# Patient Record
Sex: Female | Born: 1975 | Race: White | Hispanic: Yes | Marital: Married | Smoking: Never smoker
Health system: Southern US, Community
[De-identification: ages and names within clinical notes are randomized; demographics above are authoritative.]

## PROBLEM LIST (undated history)

## (undated) ENCOUNTER — Inpatient Hospital Stay (HOSPITAL_COMMUNITY): Payer: Self-pay

## (undated) DIAGNOSIS — M4310 Spondylolisthesis, site unspecified: Secondary | ICD-10-CM

## (undated) DIAGNOSIS — Z789 Other specified health status: Secondary | ICD-10-CM

## (undated) HISTORY — PX: COLONOSCOPY: SHX174

## (undated) HISTORY — PX: REDUCTION MAMMAPLASTY: SUR839

## (undated) HISTORY — DX: Spondylolisthesis, site unspecified: M43.10

## (undated) HISTORY — PX: EYE SURGERY: SHX253

---

## 2004-04-21 ENCOUNTER — Other Ambulatory Visit: Admission: RE | Admit: 2004-04-21 | Discharge: 2004-04-21 | Payer: Self-pay | Admitting: Obstetrics and Gynecology

## 2005-05-04 ENCOUNTER — Other Ambulatory Visit: Admission: RE | Admit: 2005-05-04 | Discharge: 2005-05-04 | Payer: Self-pay | Admitting: Obstetrics and Gynecology

## 2006-06-22 ENCOUNTER — Other Ambulatory Visit: Admission: RE | Admit: 2006-06-22 | Discharge: 2006-06-22 | Payer: Self-pay | Admitting: Obstetrics and Gynecology

## 2006-12-29 ENCOUNTER — Inpatient Hospital Stay (HOSPITAL_COMMUNITY): Admission: AD | Admit: 2006-12-29 | Discharge: 2006-12-31 | Payer: Self-pay | Admitting: Obstetrics and Gynecology

## 2009-10-10 ENCOUNTER — Encounter: Admission: RE | Admit: 2009-10-10 | Discharge: 2009-10-10 | Payer: Self-pay | Admitting: Sports Medicine

## 2009-10-10 ENCOUNTER — Ambulatory Visit: Payer: Self-pay | Admitting: Family Medicine

## 2009-10-17 ENCOUNTER — Ambulatory Visit (HOSPITAL_COMMUNITY): Payer: Self-pay | Admitting: Psychology

## 2009-11-21 ENCOUNTER — Ambulatory Visit (HOSPITAL_COMMUNITY): Payer: Self-pay | Admitting: Psychology

## 2010-10-08 NOTE — Assessment & Plan Note (Signed)
Summary: SINUS & SORE THROAT/KH   Vital Signs:  Patient Profile:   35 Years Old Female CC:      sore throat Height:     62 inches Weight:      172 pounds O2 Sat:      100 % O2 treatment:    Room Air Temp:     97.6 degrees F oral Pulse rate:   75 / minute Pulse rhythm:   regular Resp:     18 per minute BP sitting:   117 / 81  (right arm) Cuff size:   regular  Pt. in pain?   yes    Location:   body aches  Vitals Entered By: Lita Mains, RN                   Updated Prior Medication List: No Medications Current Allergies: ! VAGISIL (BENZOCAINE-RESORCINOL)History of Present Illness History from: patient Chief Complaint: sore throat History of Present Illness: for the past two day the patient has comaplaints of fever, body aches, HAs sore throat, runny nose,  and sharp ear pain bilaterally. She is currently a-febrile. She has taken excedrin for HAs. HER CHILD WAS DXED WTH STREP LAST WK. NO COUGH . NO VOMITING.   REVIEW OF SYSTEMS Constitutional Symptoms      Denies fever, chills, night sweats, weight loss, weight gain, and fatigue.  Eyes       Complains of eye pain.      Denies change in vision, eye discharge, glasses, contact lenses, and eye surgery. Ear/Nose/Throat/Mouth       Complains of ear pain, frequent runny nose, sinus problems, and sore throat.      Denies hearing loss/aids, change in hearing, ear discharge, dizziness, frequent nose bleeds, hoarseness, and tooth pain or bleeding.  Respiratory       Denies dry cough, productive cough, wheezing, shortness of breath, asthma, bronchitis, and emphysema/COPD.  Cardiovascular       Denies murmurs, chest pain, and tires easily with exhertion.    Gastrointestinal       Denies stomach pain, nausea/vomiting, diarrhea, constipation, blood in bowel movements, and indigestion. Genitourniary       Denies painful urination, kidney stones, and loss of urinary control. Neurological       Complains of headaches.      Denies  paralysis, seizures, and fainting/blackouts. Musculoskeletal       Denies muscle pain, joint pain, joint stiffness, decreased range of motion, redness, swelling, muscle weakness, and gout.      Comments: body aches Skin       Denies bruising, unusual mles/lumps or sores, and hair/skin or nail changes.  Psych       Denies mood changes, temper/anger issues, anxiety/stress, speech problems, depression, and sleep problems.  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 PETICIA ON THE SOFT PALAT Chest/Lungs: no rales, wheezes, or rhonchi bilateral, breath sounds equal without effort Heart: regular rate and  rhythm, no murmur Skin: no obvious rashes or lesions Assessment New Problems: PHARYNGITIS, ACUTE (ICD-462.0)   Plan New Medications/Changes: AMOXICILLIN 500 MG  CAPS (AMOXICILLIN) 1 by mouth TID  ##30 x 0, 10/10/2009, Marvis Moeller DO  New Orders: New Patient Level III [99203]   Prescriptions: AMOXICILLIN 500 MG CAPS (AMOXICILLIN) 1 by mouth TID  ##30 x 0   Entered and Authorized by:   Marvis Moeller DO   Signed by:   Marvis Moeller DO on 10/10/2009   Method used:   Print then Give to Patient   RxID:   [id number]   Patient Instructions: 1)  TYLENOL OR MOTRIN AS NEEDED. AVOID CAFFEINE AND MILK PRODUCTS. MUCINEX D RECOMMENDED. THROAT SPRAY OF CHOICE. FOLLOW UP WITH YOUR PCP OR RETURN IF SYMPTOMS PERSIST.

## 2010-10-08 NOTE — Letter (Signed)
Summary: Out of Work  [facility]  [address]   [place], [place] [address]   Phone: [phone]  Fax: [phone]    October 10, 2009   Employee:  Courtney Costa    To Whom It May Concern:   For Medical reasons, please excuse the above named employee from work for the following dates:  Start:   10 Oct 2009  End:   13 Oct 2009  If you need additional information, please feel free to contact our office.         Sincerely,    Marvis Moeller DO

## 2010-11-17 ENCOUNTER — Encounter: Payer: Self-pay | Admitting: Family Medicine

## 2010-11-17 ENCOUNTER — Ambulatory Visit (INDEPENDENT_AMBULATORY_CARE_PROVIDER_SITE_OTHER): Payer: BC Managed Care – PPO | Admitting: Family Medicine

## 2010-11-17 DIAGNOSIS — J029 Acute pharyngitis, unspecified: Secondary | ICD-10-CM

## 2010-11-25 NOTE — Letter (Signed)
Summary: Handout Printed  Printed Handout:  - Rheumatic Fever 

## 2010-11-25 NOTE — Assessment & Plan Note (Signed)
Summary: Additional lab testing  Rapid strep test negative Donna Christen MD  November 17, 2010 7:19 PM

## 2010-11-25 NOTE — Assessment & Plan Note (Signed)
Summary: POSS STREP THROAT /WSE Room 4   Vital Signs:  Patient Profile:   35 Years Old Female CC:      Sore throat x 2 days, son was diagnosed with strep last week Height:     62 inches Weight:      174 pounds O2 Sat:      100 % O2 treatment:    Room Air Temp:     98.8 degrees F oral Pulse rate:   85 / minute Pulse rhythm:   regular Resp:     16 per minute BP sitting:   113 / 80  (left arm) Cuff size:   regular  Vitals Entered By: Emilio Math (November 17, 2010 6:35 PM)                  Current Allergies (reviewed today): ! VAGISIL (BENZOCAINE-RESORCINOL)History of Present Illness Chief Complaint: Sore throat x 2 days, son was diagnosed with strep last week History of Present Illness:  Subjective: Patient complains of sore throat that started yesterday morning.  Her son was treated for a documented strep pharngitis last week (rapid strep negative but throat culture positive) No cough No pleuritic pain No wheezing + nasal congestion but has seasonal rhinitis No post-nasal drainage No sinus pain/pressure No itchy/red eyes No earache No hemoptysis No SOB + fever/chills No nausea No vomiting No abdominal pain No diarrhea No skin rashes + fatigue + myalgias + headache    REVIEW OF SYSTEMS Constitutional Symptoms      Denies fever, chills, night sweats, weight loss, weight gain, and fatigue.  Eyes       Denies change in vision, eye pain, eye discharge, glasses, contact lenses, and eye surgery. Ear/Nose/Throat/Mouth       Complains of frequent runny nose and sore throat.      Denies hearing loss/aids, change in hearing, ear pain, ear discharge, dizziness, frequent nose bleeds, sinus problems, hoarseness, and tooth pain or bleeding.  Respiratory       Denies dry cough, productive cough, wheezing, shortness of breath, asthma, bronchitis, and emphysema/COPD.  Cardiovascular       Denies murmurs, chest pain, and tires easily with exhertion.    Gastrointestinal     Denies stomach pain, nausea/vomiting, diarrhea, constipation, blood in bowel movements, and indigestion. Genitourniary       Denies painful urination, kidney stones, and loss of urinary control. Neurological       Complains of headaches.      Denies paralysis, seizures, and fainting/blackouts. Musculoskeletal       Complains of muscle pain and joint pain.      Denies joint stiffness, decreased range of motion, redness, swelling, muscle weakness, and gout.  Skin       Denies bruising, unusual mles/lumps or sores, and hair/skin or nail changes.  Psych       Denies mood changes, temper/anger issues, anxiety/stress, speech problems, depression, and sleep problems.  Past History:  Past Medical History: Reviewed history from 10/10/2009 and no changes required. Unremarkable  Family History: Reviewed history from 10/10/2009 and no changes required. Father-Psoriasis  Social History: Reviewed history from 10/10/2009 and no changes required. 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  Extraocular movement is intact.  Conjunctivae are not inflamed.  Ears:  Canals normal.  Tympanic membranes normal.   Nose:  Congested turbinates; no sinus tenderness Pharynx:  Erythematous Neck:  Supple.   Tender anterior/posterior nodes are palpated bilaterally.  Lungs:  Clear to auscultation.  Breath sounds are equal.  Heart:  Regular rate and rhythm without murmurs, rubs, or gallops.  Skin:  No rash  Assessment New Problems: ACUTE PHARYNGITIS (ICD-462)  SUSPECT GROUP A STREP PHARYNGITIS  Plan New Medications/Changes: PENICILLIN V POTASSIUM 500 MG TABS (PENICILLIN V POTASSIUM) 1 by mouth three times a day for 10 days  #30 x 0, 11/17/2010, Donna Christen MD  New Orders: Est. Patient Level III [phone] Planning Comments:   Will treat empirically  since family member has strep.  Begin penicillin.  Ibuprofen 200mg , 4 tabs every 8 hours with food  Follow-up with PCP if not improving.   The patient and/or caregiver has been counseled thoroughly with regard to medications prescribed including dosage, schedule, interactions, rationale for use, and possible side effects and they verbalize understanding.  Diagnoses and expected course of recovery discussed and will return if not improved as expected or if the condition worsens. Patient and/or caregiver verbalized understanding.  Prescriptions: PENICILLIN V POTASSIUM 500 MG TABS (PENICILLIN V POTASSIUM) 1 by mouth three times a day for 10 days  #30 x 0   Entered and Authorized by:   Donna Christen MD   Signed by:   Donna Christen MD on 11/17/2010   Method used:   Print then Give to Patient   RxID:   [id number]   Orders Added: 1)  Est. Patient Level III [40102]  Appended Document: POSS STREP THROAT [place] Room 4 Rapid strep: Neg

## 2011-01-23 NOTE — Discharge Summary (Signed)
NAMECHIANN, GOFFREDO NO.:  [id number]   MEDICAL RECORD NO.:  [id number]          PATIENT TYPE:  INP   LOCATION:  9110                          FACILITY:  [facility]   PHYSICIAN:  Sherron Monday, MD        DATE OF BIRTH:  1976/02/12   DATE OF ADMISSION:  12/29/2006  DATE OF DISCHARGE:  12/31/2006                               DISCHARGE SUMMARY   ADMISSION DIAGNOSIS:  Intrauterine pregnancy at term, in early labor.   DISCHARGE DIAGNOSIS:  Intrauterine pregnancy at term, in early labor,  delivered via spontaneous vaginal delivery.   HISTORY OF PRESENT ILLNESS:  A 35 year old G2, P1-0-0-1, at 4 and 1,  with regular contractions for several hours every approximately 5  minutes.  She states the contractions started overnight and were every  10 minutes but now are every 5.  They are more frequent and more  intense.  She states she has had good fetal movement, no loss of fluid,  no vaginal bleeding.  Her prenatal care was uncomplicated.   PAST MEDICAL HISTORY:  Not significant.   PAST SURGICAL HISTORY:  Not significant.   PAST OB/GYN HISTORY:  G1 term vaginal delivery, an 8 pound 4 ounce female  infant.  G2 is the present pregnancy, no complications.  She has no  history of any abnormal Pap smears.  A remote history of Chlamydia.   Currently she takes no medications.   ALLERGIES:  No known drug allergies.   SOCIAL HISTORY:  She denies alcohol, tobacco or drug use.  She is  married.   FAMILY HISTORY:  Significant for hypertension in her father and mother  with varicosities.   PRENATAL LABORATORY DATA:  Hemoglobin 12.9, platelets 250,000.  B  positive, antibody screen negative.  Gonorrhea negative, Chlamydia  negative.  RPR nonreactive.  Rubella immune.  Hepatitis B surface  antigen negative.  HIV negative.  Cystic fibrosis screen negative.  Glucola 90.  Group B strep negative.  Ultrasound performed at 19 and 2  weeks revealed appropriate growth, marginal previa, and a  female infant.  Repeat scan at 24 and 2, marginal placenta previa.  A 32 and 5 week scan  on February 29 revealed that the previa was resolved, normal growth and  normal heart anatomy.   PHYSICAL EXAMINATION ON ADMISSION:  GENERAL:  Afebrile, vital signs  stable, and a benign exam.  PELVIC:  Vaginal exam:  3-4 cm dilated, 70% effaced, and -2 station.  MONITORING:  Fetal heart tones in the 130s-140s and reactive, and she is  contracting every 1-3 minutes.   She was admitted anticipating a spontaneous vaginal delivery.  Her group  B strep was negative.  She did not need prophylaxis.  The plan was to  augment her labor with Pitocin if necessary.  At 6:10 that evening she  was ruptured for clear fluid and an IUPC was placed.  As the evening  went on she progressed to complete, complete, +3, and pushed well to  deliver a viable female infant at 2153 from OA to LOT.  Apgars were 9 at  one minute  and 9 at five minutes.  A weight of 8 pounds 4 ounces.  The  placenta was delivered intact at 2204.  OB lacerations revealed a right  labial, a first degree perineal, both repaired with 3-0 Vicryl.  EBL was  less than 500 mL.  She is B positive, rubella immune.  Her postpartum  course was relatively uncomplicated.  She remained afebrile and vital  signs stable throughout her postpartum course.  Her hemoglobin decreased  from 11.3 to 8.8.  she was discharged home on postpartum day #2 with  prescriptions for Motrin, Vicodin, as well as prenatal vitamins and  iron.  She will follow up in the office in approximately 6 weeks.  She  was given appropriate numbers to call with any questions or problems.   DISCHARGE INFORMATION:  She is B positive, rubella immune.  Hemoglobin  from 11.3 to 8.8.  Contraception:  She plans for natural family planning  and was counseled to continue a prenatal vitamin while she is doing  this.  She will breast-feed and has worked with a Advertising copywriter.      Sherron Monday,  MD  Electronically Signed     JB/MEDQ  D:  12/31/2006  T:  12/31/2006  Job:  [id number]

## 2011-07-09 ENCOUNTER — Inpatient Hospital Stay (INDEPENDENT_AMBULATORY_CARE_PROVIDER_SITE_OTHER)
Admission: RE | Admit: 2011-07-09 | Discharge: 2011-07-09 | Disposition: A | Discharge status: Home / Self Care | Payer: BC Managed Care – PPO | Source: Ambulatory Visit | Attending: Family Medicine | Admitting: Family Medicine

## 2011-07-09 ENCOUNTER — Encounter: Payer: Self-pay | Admitting: Family Medicine

## 2011-07-09 DIAGNOSIS — J069 Acute upper respiratory infection, unspecified: Secondary | ICD-10-CM

## 2011-07-15 ENCOUNTER — Telehealth (INDEPENDENT_AMBULATORY_CARE_PROVIDER_SITE_OTHER): Payer: Self-pay | Admitting: *Deleted

## 2011-08-10 NOTE — Telephone Encounter (Signed)
  Phone Note Outgoing Call Call back at [facility] Phone [phone]   Call placed by: Lajean Saver RN,  July 15, 2011 4:14 PM Call placed to: Patient Summary of Call: Callback: Patient reports she is feeling much better. She still c/o some sinus congestion.

## 2011-08-10 NOTE — Progress Notes (Signed)
Summary: COLD,COUGH....WSE rm 4   Vital Signs:  Patient Profile:   35 Years Old Female CC:      cough, hoarseness, sinus pain and green nasal congestion x 1 wk Height:     62 inches Weight:      183.50 pounds O2 Sat:      99 % O2 treatment:    Room Air Temp:     98.4 degrees F oral Pulse rate:   82 / minute Resp:     18 per minute BP sitting:   115 / 67  (left arm) Cuff size:   regular  Vitals Entered By: Clemens Catholic LPN (July 09, 2011 11:56 AM)                  Updated Prior Medication List: No Medications Current Allergies (reviewed today): ! VAGISIL (BENZOCAINE-RESORCINOL)History of Present Illness Chief Complaint: cough, hoarseness, sinus pain and green nasal congestion x 1 wk History of Present Illness:  Subjective: Patient complains of onset of sore throat about a week ago, now improved.  She has now developed sinus congestion and a cough for four days.  No pleuritic pain, but has soreness in the anterior chest No wheezing ? post-nasal drainage No sinus pain/pressure No itchy/red eyes No earache No hemoptysis No SOB No fever, + chills yesterday No nausea No vomiting No abdominal pain No diarrhea No skin rashes + fatigue + myalgias No headache   REVIEW OF SYSTEMS Constitutional Symptoms      Denies fever, chills, night sweats, weight loss, weight gain, and fatigue.  Eyes       Complains of eye surgery.      Denies change in vision, eye pain, eye discharge, glasses, and contact lenses. Ear/Nose/Throat/Mouth       Complains of sinus problems, sore throat, and hoarseness.      Denies hearing loss/aids, change in hearing, ear pain, ear discharge, dizziness, frequent runny nose, frequent nose bleeds, and tooth pain or bleeding.  Respiratory       Complains of dry cough.      Denies productive cough, wheezing, shortness of breath, asthma, bronchitis, and emphysema/COPD.  Cardiovascular       Denies murmurs, chest pain, and tires easily with  exhertion.    Gastrointestinal       Complains of diarrhea.      Denies stomach pain, nausea/vomiting, constipation, blood in bowel movements, and indigestion. Genitourniary       Denies painful urination, kidney stones, and loss of urinary control. Neurological       Denies paralysis, seizures, and fainting/blackouts. Musculoskeletal       Denies muscle pain, joint pain, joint stiffness, decreased range of motion, redness, swelling, muscle weakness, and gout.  Skin       Denies bruising, unusual mles/lumps or sores, and hair/skin or nail changes.  Psych       Complains of anxiety/stress.      Denies mood changes, temper/anger issues, speech problems, depression, and sleep problems. Other Comments: pt c/o cough, hoarseness, sinus pain and green nasal congestion x 1 wk. she has taken Sudafed.   Past History:  Past Medical History: Reviewed history from 10/10/2009 and no changes required. Unremarkable  Past Surgical History: Reviewed history from 10/10/2009 and no changes required. Denies surgical history  Family History: Reviewed history from 10/10/2009 and no changes required. Father-Psoriasis  Social History: Reviewed history from 10/10/2009 and no changes required. 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  Extraocular movement is intact.  Conjunctivae are not inflamed.  Ears:  Canals normal.  Tympanic membranes normal.   Nose:  Mildly congested turbinates.  No sinus tenderness  Pharynx:  Normal  Neck:  Supple.  Slightly tender shotty posterior nodes are palpated bilaterally.  Chest:  Nontender Lungs:  Clear to auscultation.  Breath sounds are equal.  Heart:  Regular rate and rhythm without murmurs, rubs, or gallops.  Abdomen:  Nontender without masses or  hepatosplenomegaly.  Bowel sounds are present.  No CVA or flank tenderness.  Extremities:  No edema.   Skin:  No rash Assessment New Problems: UPPER RESPIRATORY INFECTION, ACUTE (ICD-465.9)  NO EVIDENCE BACTERIAL INFECTION TODAY  Plan New Medications/Changes: BENZONATATE 200 MG CAPS (BENZONATATE) One by mouth hs as needed cough  #12 x 0, 07/09/2011, Donna Christen MD AZITHROMYCIN 250 MG TABS (AZITHROMYCIN) Two tabs by mouth on day 1, then 1 tab daily on days 2 through 5 (Rx void after 07/16/11)  #6 tabs x 0, 07/09/2011, Donna Christen MD  New Orders: Pulse Oximetry (single measurment) [94760] Est. Patient Level III [[address]] Planning Comments:   Treat symptomatically for now:  Increase fluid intake, begin expectorant/decongestant, topical decongestant,  cough suppressant at bedtime.  If fever/chills/sweats persist, or if not improving 5  days begin Z-pack (given Rx to hold).  Followup with PCP if not improving 7 to 10 days.   The patient and/or caregiver has been counseled thoroughly with regard to medications prescribed including dosage, schedule, interactions, rationale for use, and possible side effects and they verbalize understanding.  Diagnoses and expected course of recovery discussed and will return if not improved as expected or if the condition worsens. Patient and/or caregiver verbalized understanding.  Prescriptions: BENZONATATE 200 MG CAPS (BENZONATATE) One by mouth hs as needed cough  #12 x 0   Entered and Authorized by:   Donna Christen MD   Signed by:   Donna Christen MD on 07/09/2011   Method used:   Print then Give to Patient   RxID:   [id number] AZITHROMYCIN 250 MG TABS (AZITHROMYCIN) Two tabs by mouth on day 1, then 1 tab daily on days 2 through 5 (Rx void after 07/16/11)  #6 tabs x 0   Entered and Authorized by:   Donna Christen MD   Signed by:   Donna Christen MD on 07/09/2011   Method used:   Print then Give to Patient   RxID:   [id number]   Patient  Instructions: 1)  Take Mucinex D (guaifenesin with decongestant) twice daily for congestion. 2)  Increase fluid intake, rest. 3)  May use Afrin nasal spray (or generic oxymetazoline) twice daily for about 5 days.  Also recommend using saline nasal spray several times daily and/or saline nasal irrigation. 4)  Begin Azithromycin if not improving about 5 days or if persistent fever develops. 5)  Followup with family doctor if not improving 7 to 10 days.   Orders Added: 1)  Pulse Oximetry (single measurment) [94760] 2)  Est. Patient Level III [29528]

## 2012-05-16 ENCOUNTER — Encounter: Payer: Self-pay | Admitting: Family Medicine

## 2012-05-16 ENCOUNTER — Ambulatory Visit (INDEPENDENT_AMBULATORY_CARE_PROVIDER_SITE_OTHER): Payer: BC Managed Care – PPO | Admitting: Family Medicine

## 2012-05-16 VITALS — BP 125/84 | HR 69 | Ht 61.0 in | Wt 173.0 lb

## 2012-05-16 DIAGNOSIS — Q762 Congenital spondylolisthesis: Secondary | ICD-10-CM

## 2012-05-16 DIAGNOSIS — M4310 Spondylolisthesis, site unspecified: Secondary | ICD-10-CM | POA: Insufficient documentation

## 2012-05-16 HISTORY — DX: Spondylolisthesis, site unspecified: M43.10

## 2012-05-16 MED ORDER — BENZOCAINE-RESORCINOL 5-2 % VA CREA: TOPICAL_CREAM | Freq: Every day | VAGINAL | Status: DC

## 2012-05-16 NOTE — Progress Notes (Signed)
CC: Courtney Costa is a 36 y.o. female is here for Establish Care   Subjective: HPI:  Patient presents to establish care and to discuss back pain. She reports no significant past medical history nor surgical history other than the delivery of 2 healthy boys. She takes no medications on a daily basis she has no formal exercise plan. Tries to watch what she eats she does not abuse alcohol, tobacco nor recreational drugs.  Past family history is only significant for type 2 diabetes  She's had low back pain that central and nonradiating for the past years. It seems to get worse with activity and improves with swimming or cardio workouts. She had an x-ray in 2011 that reviewed with her today it shows spondylolisthesis of L5 over S1. She denies any motor or sensory disturbances in the lower extremities, saddle anesthesia, nor bowel or bladder incontinence. The pain is not worsened over the past years but appears to be more annoying lately. She denies any history of trauma. The pain response to Motrin. She denies any dysuria, flank pain, abdominal pain, nor bowel irregularities, nor skin changes over the site of her pain.   She is requesting referral to a breast reduction surgeon.   Review Of Systems Outlined In HPI  History reviewed. No pertinent past medical history.   History reviewed. No pertinent family history.   History  Substance Use Topics  . Smoking status: Never Smoker   . Smokeless tobacco: Not on file  . Alcohol Use: Not on file     Objective: Filed Vitals:   05/16/12 1111  BP: 125/84  Pulse: 69    General: Alert and Oriented, No Acute Distress HEENT: Pupils equal, round, reactive to light. Conjunctivae clear.   Lungs: Clear to auscultation bilaterally, no wheezing/ronchi/rales.  Comfortable work of breathing. Good air movement. Cardiac: Regular rate and rhythm. Normal S1/S2.  No murmurs, rubs, nor gallops.   Abdomen: soft and non tender without palpable  masses. Extremities: No peripheral edema.  Strong peripheral pulses.  MSK: Normal gait, full range of motion and strength in lower extremities, back pain not reproducible localized over the superior aspect of the sacrum.  Skin: Warm and dry.  Assessment & Plan: Courtney Costa was seen today for establish care.  Diagnoses and associated orders for this visit:  Spondylisthesis - Ambulatory referral to Physical Therapy  Patient would like physical therapy referral to start a home exercise plan to help reduce her back pain, expect for stabilization and rehabilitation of core musculature to aid in this. Continues to use nonsteroidal anti-inflammatories as needed. She'll be getting a lab work by her employer this weekend and she'll share this with [place], other than cholesterol and diabetic screening I not sure if she needs much more. She'll contact us with the name and location of a breast reduction surgeon that she's her positive feedback from probably happy to place a referral.    Return in about 1 year (around 05/16/2013).  Requested Prescriptions   Signed Prescriptions Disp Refills  . benzocaine-resorcinol (VAGISIL) 5-2 % vaginal cream 60 g 0    Sig: Place vaginally at bedtime.

## 2012-05-31 ENCOUNTER — Ambulatory Visit: Payer: BC Managed Care – PPO | Admitting: Physical Therapy

## 2013-01-06 ENCOUNTER — Other Ambulatory Visit (HOSPITAL_COMMUNITY)
Admission: RE | Admit: 2013-01-06 | Discharge: 2013-01-06 | Disposition: A | Discharge status: Home / Self Care | Payer: BC Managed Care – PPO | Source: Ambulatory Visit | Attending: Family Medicine | Admitting: Family Medicine

## 2013-01-06 ENCOUNTER — Ambulatory Visit (INDEPENDENT_AMBULATORY_CARE_PROVIDER_SITE_OTHER): Payer: BC Managed Care – PPO | Admitting: Family Medicine

## 2013-01-06 ENCOUNTER — Encounter: Payer: Self-pay | Admitting: Family Medicine

## 2013-01-06 VITALS — BP 127/79 | HR 71 | Ht 61.0 in | Wt 183.0 lb

## 2013-01-06 DIAGNOSIS — Z23 Encounter for immunization: Secondary | ICD-10-CM

## 2013-01-06 DIAGNOSIS — Z01419 Encounter for gynecological examination (general) (routine) without abnormal findings: Secondary | ICD-10-CM | POA: Insufficient documentation

## 2013-01-06 DIAGNOSIS — E669 Obesity, unspecified: Secondary | ICD-10-CM

## 2013-01-06 DIAGNOSIS — Z0000 Encounter for general adult medical examination without abnormal findings: Secondary | ICD-10-CM

## 2013-01-06 DIAGNOSIS — Z13220 Encounter for screening for lipoid disorders: Secondary | ICD-10-CM

## 2013-01-06 DIAGNOSIS — Z124 Encounter for screening for malignant neoplasm of cervix: Secondary | ICD-10-CM

## 2013-01-06 DIAGNOSIS — Z113 Encounter for screening for infections with a predominantly sexual mode of transmission: Secondary | ICD-10-CM | POA: Insufficient documentation

## 2013-01-06 DIAGNOSIS — Z130 Encounter for screening for diseases of the blood and blood-forming organs and certain disorders involving the immune mechanism: Secondary | ICD-10-CM

## 2013-01-06 DIAGNOSIS — Z131 Encounter for screening for diabetes mellitus: Secondary | ICD-10-CM

## 2013-01-06 DIAGNOSIS — Z1151 Encounter for screening for human papillomavirus (HPV): Secondary | ICD-10-CM | POA: Insufficient documentation

## 2013-01-06 DIAGNOSIS — L309 Dermatitis, unspecified: Secondary | ICD-10-CM

## 2013-01-06 LAB — BASIC METABOLIC PANEL WITH GFR
BUN: 11 mg/dL (ref 6–23)
CO2: 26 mEq/L (ref 19–32)
Calcium: 9.4 mg/dL (ref 8.4–10.5)
Chloride: 102 mEq/L (ref 96–112)
Creat: 0.65 mg/dL (ref 0.50–1.10)
GFR, Est African American: >89 mL/min
GFR, Est Non African American: >89 mL/min
Glucose, Bld: 85 mg/dL (ref 70–99)
Potassium: 4.3 mEq/L (ref 3.5–5.3)
Sodium: 138 mEq/L (ref 135–145)

## 2013-01-06 LAB — CBC
HCT: 41.4 % (ref 36.0–46.0)
Hemoglobin: 14.7 g/dL (ref 12.0–15.0)
MCH: 30.4 pg (ref 26.0–34.0)
MCHC: 35.5 g/dL (ref 30.0–36.0)
MCV: 85.5 fL (ref 78.0–100.0)
Platelets: 235 10*3/uL (ref 150–400)
RBC: 4.84 MIL/uL (ref 3.87–5.11)
RDW: 13.3 % (ref 11.5–15.5)
WBC: 6.6 10*3/uL (ref 4.0–10.5)

## 2013-01-06 LAB — TSH: TSH: 3.392 u[IU]/mL (ref 0.350–4.500)

## 2013-01-06 LAB — LIPID PANEL
Cholesterol: 194 mg/dL (ref 0–200)
HDL: 65 mg/dL (ref >39)
LDL Cholesterol: 106 mg/dL — ABNORMAL HIGH (ref 0–99)
Total CHOL/HDL Ratio: 3 Ratio
Triglycerides: 115 mg/dL (ref <150)
VLDL: 23 mg/dL (ref 0–40)

## 2013-01-06 MED ORDER — CLOBETASOL PROPIONATE 0.05 % EX GEL: CUTANEOUS | Status: DC

## 2013-01-06 NOTE — Progress Notes (Signed)
CC: Courtney Costa is a 37 y.o. female is here for Annual Exam and Gynecologic Exam   Subjective: HPI:  Colonoscopy: Not indicated until age 27 Papsmear: No history of abnormals but had Chlamydia return on 1 years ago for unknown reasons, obtaining Pap today  Mammogram: She had a mammogram a little over 5 years ago which turned out to show a blocked mammary gland which was just a collection of milk, she was breastfeeding at the time  Influenza Vaccine: Out of season Pneumovax: No indication until 33 Td/Tdap: She believes it's been more than 10 years since tetanus booster Zoster: (Start 37 yo)  No acute complaints  Over the past 2 weeks have you been bothered by: - Little interest or pleasure in doing things: no - Feeling down depressed or hopeless: no  Review of Systems - General ROS: negative for - chills, fever, night sweats, weight gain or weight loss Ophthalmic ROS: negative for - decreased vision Psychological ROS: negative for - anxiety or depression ENT ROS: negative for - hearing change, nasal congestion, tinnitus or allergies Hematological and Lymphatic ROS: negative for - bleeding problems, bruising or swollen lymph nodes Breast ROS: negative Respiratory ROS: no cough, shortness of breath, or wheezing Cardiovascular ROS: no chest pain or dyspnea on exertion Gastrointestinal ROS: no abdominal pain, change in bowel habits, or black or bloody stools Genito-Urinary ROS: negative for - genital discharge, genital ulcers, incontinence or abnormal bleeding from genitals Musculoskeletal ROS: negative for - joint pain or muscle pain Neurological ROS: negative for - headaches or memory loss Dermatological ROS: negative for lumps, mole changes, rash and skin lesion changes  Past Medical History  Diagnosis Date  . Spondylisthesis 05/16/2012  . Spondylisthesis 05/16/2012     No family history on file. reviewed, no pertinent family medical history per patient  History   Substance Use Topics  . Smoking status: Never Smoker   . Smokeless tobacco: Not on file  . Alcohol Use: Not on file     Objective: Filed Vitals:   01/06/13 0859  BP: 127/79  Pulse: 71    General: No Acute Distress HEENT: Atraumatic, normocephalic, conjunctivae normal without scleral icterus.  No nasal discharge, hearing grossly intact, TMs with good landmarks bilaterally with no middle ear abnormalities, posterior pharynx clear without oral lesions. Neck: Supple, trachea midline, no cervical nor supraclavicular adenopathy. Pulmonary: Clear to auscultation bilaterally without wheezing, rhonchi, nor rales. Cardiac: Regular rate and rhythm.  No murmurs, rubs, nor gallops. No peripheral edema.  2+ peripheral pulses bilaterally. Abdomen: Bowel sounds normal.  No masses.  Non-tender without rebound.  Negative Murphy's sign. Breast: No suspicious architectural changes, nipple retraction, nor skin changes. No palpable masses in either breast nor axilla nor supraclavicular regions GU:  Labia majora & minora without lesions.  Distal urethra unremarkable.  Vaginal wall integrity preserved without mucosal lesions.  Cervix non-tender and without gross lesions nor discharge.   MSK: Grossly intact, no signs of weakness.  Full strength throughout upper and lower extremities.  Full ROM in upper and lower extremities.  No midline spinal tenderness. Neuro: Gait unremarkable, CN II-XII grossly intact.  C5-C6 Reflex 2/4 Bilaterally, L4 Reflex 2/4 Bilaterally.  Cerebellar function intact. Skin: No rashes. Psych: Alert and oriented to person/place/time.  Thought process normal. No anxiety/depression.   Assessment & Plan: Courtney Costa was seen today for annual exam and gynecologic exam.  Diagnoses and associated orders for this visit:  Annual physical exam - Tdap vaccine greater than or equal to 7yo IM  Screening, lipid - Lipid panel  Diabetes mellitus screening - BASIC METABOLIC PANEL WITH  GFR  Screening for deficiency anemia - CBC  Obesity - TSH  Dermatitis - clobetasol (TEMOVATE) 0.05 % GEL; Apply to irritated skin twice a day for no more than a week as needed.  Screening for cervical cancer - Cytology - PAP    Healthy lifestyle interventions including but limited to regular exercise, a healthy low fat diet, moderation of salt intake, the dangers of tobacco/alcohol/recreational drug use, nutrition supplementation, and accident avoidance were discussed with the patient and a handout was provided for future reference. Due for cholesterol screening, diabetes screening, anemia screening, and will rule out thyroid abnormality do to obesity. She is currently trying to conceive and unsuccessful since October. No trouble conceiving with her current partner producing 2 children already  Return in about 1 year (around 01/06/2014).

## 2013-01-06 NOTE — Patient Instructions (Addendum)
Dr. Ezmeralda Stefanick's General Advice Following Your Complete Physical Exam  The Benefits of Regular Exercise: Unless you suffer from an uncontrolled cardiovascular condition, studies strongly suggest that regular exercise and physical activity will add to both the quality and length of your life.  The World Health Organization recommends 150 minutes of moderate intensity aerobic activity every week.  This is best split over 3-4 days a week, and can be as simple as a brisk walk for just over 35 minutes "most days of the week".  This type of exercise has been shown to lower LDL-Cholesterol, lower average blood sugars, lower blood pressure, lower cardiovascular disease risk, improve memory, and increase one's overall sense of wellbeing.  The addition of anaerobic (or "strength training") exercises offers additional benefits including but not limited to increased metabolism, prevention of osteoporosis, and improved overall cholesterol levels.  How Can I Strive For A Low-Fat Diet?: Current guidelines recommend that 25-35 percent of your daily energy (food) intake should come from fats.  One might ask how can this be achieved without having to dissect each meal on a daily basis?  Switch to skim or 1% milk instead of whole milk.  Focus on lean meats such as ground turkey, fresh fish, baked chicken, and lean cuts of beef as your source of dietary protein.  Consume less than 300mg/day of dietary cholesterol.  Limit trans fatty acid consumption primarily by limiting synthetic trans fats such as partially hydrogenated oils (Ex: fried fast foods).  Focus efforts on reducing your intake of "solid" fats (Ex: Butter).  Substitute olive or vegetable oil for solid fats where possible.  Moderation of Salt Intake: Provided you don't carry a diagnosis of congestive heart failure nor renal failure, I recommend a daily allowance of no more than 2300 mg of salt (sodium).  Keeping under this daily goal is associated with a  decreased risk of cardiovascular events, creeping above it can lead to elevated blood pressures and increases your risk of cardiovascular events.  Milligrams (mg) of salt is listed on all nutrition labels, and your daily intake can add up faster than you think.  Most canned and frozen dinners can pack in over half your daily salt allowance in one meal.    Lifestyle Health Risks: Certain lifestyle choices carry specific health risks.  As you may already know, tobacco use has been associated with increasing one's risk of cardiovascular disease, pulmonary disease, numerous cancers, among many other issues.  What you may not know is that there are medications and nicotine replacement strategies that can more than double your chances of successfully quitting.  I would be thrilled to help manage your quitting strategy if you currently use tobacco products.  When it comes to alcohol use, I've yet to find an "ideal" daily allowance.  Provided an individual does not have a medical condition that is exacerbated by alcohol consumption, general guidelines determine "safe drinking" as no more than two standard drinks for a man or no more than one standard drink for a female per day.  However, much debate still exists on whether any amount of alcohol consumption is technically "safe".  My general advice, keep alcohol consumption to a minimum for general health promotion.  If you or others believe that alcohol, tobacco, or recreational drug use is interfering with your life, I would be happy to provide confidential counseling regarding treatment options.  General "Over The Counter" Nutrition Advice: Postmenopausal women should aim for a daily calcium intake of 1200 mg, however a significant   portion of this might already be provided by diets including milk, yogurt, cheese, and other dairy products.  Vitamin D has been shown to help preserve bone density, prevent fatigue, and has even been shown to help reduce falls in the  elderly.  Ensuring a daily intake of 800 Units of Vitamin D is a good place to start to enjoy the above benefits, we can easily check your Vitamin D level to see if you'd potentially benefit from supplementation beyond 800 Units a day.  Folic Acid intake should be of particular concern to women of childbearing age.  Daily consumption of 400-800 mcg of Folic Acid is recommended to minimize the chance of spinal cord defects in a fetus should pregnancy occur.    For many adults, accidents still remain one of the most common culprits when it comes to cause of death.  Some of the simplest but most effective preventitive habits you can adopt include regular seatbelt use, proper helmet use, securing firearms, and regularly testing your smoke and carbon monoxide detectors.  Austen Oyster B. Merica Prell DO [facility] [address], [address] [place], [place] [address] Phone: [phone]  

## 2013-01-13 ENCOUNTER — Telehealth: Payer: Self-pay | Admitting: Family Medicine

## 2013-01-13 NOTE — Telephone Encounter (Signed)
Courtney Costa,  Will you please let Courtney Costa know that her pap smear was an adequate sample and did not show any lesions or malignancy.  This means she can space thees out to every five years if she'd like.  Gonorrhoea and chlamydia were negative as well

## 2013-01-16 NOTE — Telephone Encounter (Signed)
Pt.notified

## 2013-03-06 ENCOUNTER — Other Ambulatory Visit: Payer: Self-pay | Admitting: Family Medicine

## 2013-11-06 ENCOUNTER — Telehealth: Payer: Self-pay | Admitting: *Deleted

## 2013-11-06 DIAGNOSIS — B353 Tinea pedis: Secondary | ICD-10-CM

## 2013-11-06 NOTE — Telephone Encounter (Signed)
Pt request a referral to a derm for a fungus on her feet. It is very swollen and itchey

## 2013-11-07 NOTE — Telephone Encounter (Signed)
Referral hsa been placed

## 2013-11-07 NOTE — Telephone Encounter (Signed)
Pt.notified

## 2013-11-07 NOTE — Telephone Encounter (Signed)
Error

## 2014-03-05 ENCOUNTER — Telehealth: Payer: Self-pay | Admitting: *Deleted

## 2014-03-05 DIAGNOSIS — Z1239 Encounter for other screening for malignant neoplasm of breast: Secondary | ICD-10-CM

## 2014-03-05 NOTE — Telephone Encounter (Signed)
Pt asks for a letter to be written to take to her surgeon stating that she has no health issues and that she is a good candidate for breast reduction surgery so that her insurance will pay for her breast reduction surgery. I can write the letter if you put mammo order in

## 2014-03-05 NOTE — Telephone Encounter (Signed)
Mammogram order placed, I'd greatly appreciate it if you wrote the mentioned letter.

## 2014-03-06 NOTE — Telephone Encounter (Signed)
Letter written and I did fax it to the surgeons office

## 2014-03-20 ENCOUNTER — Ambulatory Visit: Payer: BC Managed Care – PPO

## 2014-03-20 ENCOUNTER — Ambulatory Visit (INDEPENDENT_AMBULATORY_CARE_PROVIDER_SITE_OTHER): Payer: BC Managed Care – PPO

## 2014-03-20 DIAGNOSIS — R928 Other abnormal and inconclusive findings on diagnostic imaging of breast: Secondary | ICD-10-CM

## 2014-03-20 DIAGNOSIS — Z1239 Encounter for other screening for malignant neoplasm of breast: Secondary | ICD-10-CM

## 2014-03-29 ENCOUNTER — Other Ambulatory Visit: Payer: Self-pay | Admitting: Family Medicine

## 2014-03-29 DIAGNOSIS — R928 Other abnormal and inconclusive findings on diagnostic imaging of breast: Secondary | ICD-10-CM

## 2014-04-03 ENCOUNTER — Other Ambulatory Visit: Payer: BC Managed Care – PPO

## 2014-04-03 ENCOUNTER — Encounter: Payer: Self-pay | Admitting: Family Medicine

## 2014-04-03 DIAGNOSIS — N6310 Unspecified lump in the right breast, unspecified quadrant: Secondary | ICD-10-CM | POA: Insufficient documentation

## 2014-05-07 ENCOUNTER — Emergency Department
Admission: EM | Admit: 2014-05-07 | Discharge: 2014-05-07 | Disposition: A | Discharge status: Home / Self Care | Payer: BC Managed Care – PPO | Source: Home / Self Care

## 2014-05-07 ENCOUNTER — Encounter: Payer: Self-pay | Admitting: Emergency Medicine

## 2014-05-07 DIAGNOSIS — L03032 Cellulitis of left toe: Secondary | ICD-10-CM

## 2014-05-07 DIAGNOSIS — L03039 Cellulitis of unspecified toe: Secondary | ICD-10-CM

## 2014-05-07 MED ORDER — CEPHALEXIN 500 MG PO CAPS: 500.0000 mg | ORAL_CAPSULE | Freq: Three times a day (TID) | ORAL | Status: DC

## 2014-05-07 NOTE — Discharge Instructions (Signed)
Begin warm soaks two or three times daily.   Paronychia  Paronychia is an infection of the skin caused by germs. It happens by the fingernail or toenail. You can avoid it by not:  Pulling on hangnails.  Nail biting.  Thumb sucking.  Cutting fingernails and toenails too short.  Cutting the skin at the base and sides of the fingernail or toenail (cuticle). HOME CARE  Keep the fingers or toes very dry. Put rubber gloves over cotton gloves when putting hands in water.  Keep the wound clean and bandaged (dressed) as told by your doctor.  Soak the fingers or toes in warm water for 15 to 20 minutes. Soak them 3 to 4 times per day for germ infections. Fungal infections are difficult to treat. Fungal infections often require treatment for a long time.  Only take medicine as told by your doctor. GET HELP RIGHT AWAY IF:   You have redness, puffiness (swelling), or pain that gets worse.  You see yellowish-white fluid (pus) coming from the wound.  You have a fever.  You have a bad smell coming from the wound or bandage. MAKE SURE YOU:  Understand these instructions.  Will watch your condition.  Will get help if you are not doing well or get worse. Document Released: 08/12/2009 Document Revised: 11/16/2011 Document Reviewed: 08/12/2009 [facility] Patient Information 2015 [place], [place]. This information is not intended to replace advice given to you by your health care provider. Make sure you discuss any questions you have with your health care provider.

## 2014-05-07 NOTE — ED Notes (Signed)
Pt c/o LT great toe ingrown x 2 days.

## 2014-05-07 NOTE — ED Provider Notes (Signed)
CSN: [id number]     Arrival date & time 05/07/14  1245 History   None    Chief Complaint  Patient presents with  . Nail Problem     HPI Comments: Patient complains of onset of soreness at the medial aspect of her left great toenail two days ago.  Patient is a 38 y.o. female presenting with toe pain. The history is provided by the patient.  Toe Pain This is a new problem. The current episode started 2 days ago. The problem occurs constantly. The problem has been gradually worsening. The symptoms are aggravated by walking. Nothing relieves the symptoms. She has tried nothing for the symptoms.    Past Medical History  Diagnosis Date  . Spondylisthesis 05/16/2012  . Spondylisthesis 05/16/2012   History reviewed. No pertinent past surgical history. History reviewed. No pertinent family history. History  Substance Use Topics  . Smoking status: Never Smoker   . Smokeless tobacco: Not on file  . Alcohol Use: No   OB History   Grav Para Term Preterm Abortions TAB SAB Ect Mult Living                 Review of Systems  All other systems reviewed and are negative.   Allergies  Vagisil  Home Medications   Prior to Admission medications   Medication Sig Start Date End Date Taking? Authorizing Provider  phentermine 37.5 MG capsule Take 37.5 mg by mouth every morning.   Yes Historical Provider, MD  cephALEXin (KEFLEX) 500 MG capsule Take 1 capsule (500 mg total) by mouth 3 (three) times daily. 05/07/14   Lattie Haw, MD  clobetasol (TEMOVATE) 0.05 % GEL APPLY TO IRRITATED SKIN TWICE A DAY FOR NO MORE THAN A WEEK AS NEEDED. 03/06/13   Sean Hommel, DO   BP 127/89  Pulse 78  Temp(Src) 98.6 F (37 C) (Oral)  Resp 16  Ht  (1.549 m)  Wt 156 lb (70.761 kg)  BMI 29.49 kg/m2  SpO2 100%  LMP 05/04/2014 Physical Exam  Nursing note and vitals reviewed. Constitutional: She is oriented to person, place, and time. She appears well-developed and well-nourished. No distress.  HENT:    Head: Normocephalic.  Eyes: Conjunctivae are normal. Pupils are equal, round, and reactive to light.  Musculoskeletal:       Left foot: She exhibits tenderness and swelling. She exhibits normal range of motion, no bony tenderness and normal capillary refill.       Feet:  Left great toe has tenderness with minimal swelling/erythema at medial edge of toenail.  Does not appear ingrown.  Neurological: She is alert and oriented to person, place, and time.  Skin: Skin is warm and dry.    ED Course  Procedures  none   MDM   1. Paronychia of great toe, left    Begin Keflex. Begin warm soaks two or three times daily. Followup with [id number] if not improved in one week.     Lattie Haw, MD 05/12/14 [phone]

## 2014-07-02 ENCOUNTER — Encounter: Payer: Self-pay | Admitting: Physician Assistant

## 2014-07-02 ENCOUNTER — Ambulatory Visit (INDEPENDENT_AMBULATORY_CARE_PROVIDER_SITE_OTHER): Payer: BC Managed Care – PPO | Admitting: Physician Assistant

## 2014-07-02 VITALS — BP 139/95 | HR 103 | Ht 61.0 in | Wt 158.0 lb

## 2014-07-02 DIAGNOSIS — F419 Anxiety disorder, unspecified: Secondary | ICD-10-CM

## 2014-07-02 DIAGNOSIS — F411 Generalized anxiety disorder: Secondary | ICD-10-CM

## 2014-07-02 DIAGNOSIS — F430 Acute stress reaction: Principal | ICD-10-CM

## 2014-07-02 DIAGNOSIS — F329 Major depressive disorder, single episode, unspecified: Secondary | ICD-10-CM | POA: Diagnosis not present

## 2014-07-02 DIAGNOSIS — G4700 Insomnia, unspecified: Secondary | ICD-10-CM | POA: Diagnosis not present

## 2014-07-02 DIAGNOSIS — F32A Depression, unspecified: Secondary | ICD-10-CM | POA: Insufficient documentation

## 2014-07-02 MED ORDER — CLONAZEPAM 0.5 MG PO TABS: 0.5000 mg | ORAL_TABLET | Freq: Two times a day (BID) | ORAL | Status: DC | PRN

## 2014-07-02 MED ORDER — CITALOPRAM HYDROBROMIDE 10 MG PO TABS: 10.0000 mg | ORAL_TABLET | Freq: Every day | ORAL | Status: DC

## 2014-07-02 NOTE — Progress Notes (Signed)
   Subjective:    Patient ID: Courtney CanterburyLaura Elizabeth Costa, female    DOB: 02/06/1976, 38 y.o.   MRN: [id number]  HPI Patient is a 38 year old female who presents to the clinic with worsening anxiety and depression. She has had a really bad year. Her husband's family has moved into her house. Her and her husband are having a lot of problems. He has cheated on her a few times in the past. He drinks a lot of alcohol. She feels like he is not willing to work on their marriage and she is tired of forgiving him. They have 2 children 7 and 11. She feels like she is staying with her husband due to her children. She is very down. She feels very worthless and unlovable. She does love her husband that feels like he does not love her. She is very stressed with her job currently with the her schedule. She does feel like some days she would like to be don't from this area however she has never made a plan. She loves her children too much to leave them. Patient has no desire to do anything sleep or eat. Her mind is constantly going into thousand directions of what today. She is very irritable and always yelling and then feeling guilty. She has tried marital counseling and they have gone to 3 sessions but there schedule does not permit going to counseling. They are not sexually active.   Review of Systems  All other systems reviewed and are negative.      Objective:   Physical Exam  Constitutional: She is oriented to person, place, and time. She appears well-developed and well-nourished.  HENT:  Head: Normocephalic and atraumatic.  Cardiovascular: Normal rate, regular rhythm and normal heart sounds.   Pulmonary/Chest: Effort normal and breath sounds normal.  Neurological: She is alert and oriented to person, place, and time.  Psychiatric: She has a normal mood and affect. Her behavior is normal.  Very tearful when speaking of her life situation.          Assessment & Plan:  Anxiety due to acute  stress/depression/insomnia- GAD-7 was 21. PHQ-9 was 21. Started Celexa 10 mg at bedtime. Discussed side effects of SSRIs. Discussed they do take some time to work and to give 4-6 weeks. Also did give Klonopin to use up to twice a day as needed for acute anxiety and for sleep. Discussed addictive nature of drug and possible abuse potential. I certainly did encourage further counseling and some decision-making over the next couple of months. Certainly she is this distrought over this situation and seems that situation is not changing unless she changes something.needs follow up in 4-6 weeks.   Spent 30 minutes with patient and greater than 50 percent of visit spent counseling patient regarding treatment plan.

## 2014-07-30 ENCOUNTER — Ambulatory Visit: Payer: BC Managed Care – PPO | Admitting: Family Medicine

## 2014-07-30 DIAGNOSIS — Z0289 Encounter for other administrative examinations: Secondary | ICD-10-CM

## 2014-08-07 ENCOUNTER — Other Ambulatory Visit: Payer: Self-pay | Admitting: Plastic Surgery

## 2014-08-07 HISTORY — PX: BREAST REDUCTION SURGERY: SHX8

## 2014-08-23 ENCOUNTER — Telehealth: Payer: Self-pay | Admitting: *Deleted

## 2014-08-23 NOTE — Telephone Encounter (Signed)
Pt left a message stating that she has an infection from a breast infection surgery. She saw her surgeon who rx'ed cipro. She f/u with him and states it is getting better. Pt is still concerned and she wanted advice over the phone or wanted to know if she could be seen. Pt has already been scheduled for an appointment tomorrow

## 2014-08-24 ENCOUNTER — Ambulatory Visit (INDEPENDENT_AMBULATORY_CARE_PROVIDER_SITE_OTHER): Payer: BC Managed Care – PPO | Admitting: Family Medicine

## 2014-08-24 ENCOUNTER — Encounter: Payer: Self-pay | Admitting: Family Medicine

## 2014-08-24 VITALS — BP 116/80 | HR 88 | Temp 98.0°F | Wt 160.0 lb

## 2014-08-24 DIAGNOSIS — N61 Inflammatory disorders of breast: Secondary | ICD-10-CM | POA: Diagnosis not present

## 2014-08-24 MED ORDER — OXYCODONE-ACETAMINOPHEN 10-325 MG PO TABS: 1.0000 | ORAL_TABLET | Freq: Three times a day (TID) | ORAL | Status: DC | PRN

## 2014-08-24 MED ORDER — SULFAMETHOXAZOLE-TRIMETHOPRIM 800-160 MG PO TABS: ORAL_TABLET | ORAL | Status: AC

## 2014-08-24 NOTE — Progress Notes (Signed)
CC: Courtney Costa is a 38 y.o. female is here for breast infection   Subjective: HPI:  Dec 1st she had breast reduction surgery bilaterally. On Monday of this week she noticed worsening breast tenderness and discharge from the bottom of the left breast. She was seen by her plastic surgeon and prescribed Keflex she was taking 500 mg 4 times a day for the first 2 days then was instructed to reduce back to 250 mg 4 times a day. She states that the pain is clearly improving, redness of the breast left breast has significantly improved however she continues to have discharge 24 hours a day from the bottom of the left breast. She describes discharge as green, pus, blood. She denies fevers, chills, nausea nor swollen lymph nodes   Review Of Systems Outlined In HPI  Past Medical History  Diagnosis Date  . Spondylisthesis 05/16/2012  . Spondylisthesis 05/16/2012    No past surgical history on file. No family history on file.  History   Social History  . Marital Status: Married    Spouse Name: N/A    Number of Children: N/A  . Years of Education: N/A   Occupational History  . Not on file.   Social History Main Topics  . Smoking status: Never Smoker   . Smokeless tobacco: Not on file  . Alcohol Use: No  . Drug Use: No  . Sexual Activity: Not on file   Other Topics Concern  . Not on file   Social History Narrative     Objective: BP 116/80 mmHg  Pulse 88  Temp(Src) 98 F (36.7 C) (Oral)  Wt 160 lb (72.576 kg)  General: Alert and Oriented, No Acute Distress HEENT: Pupils equal, round, reactive to light. Conjunctivae clear.  Moist mucous membranes Lungs: Clear and comfortable work of breathing Cardiac: Regular rate and rhythm.  Breast: [name] as chaperone. At the 6:00 portion of the left breast there is a half centimeter triangle-shaped dehiscence of the surgical site. There is a cloudy scant discharge coming from this opening with compression of the breast. There is  slight induration surrounding this opening. There are no other gross visual abnormalities, otherwise the surgical sites are clean dry and intact and appear to be healing well. Extremities: No peripheral edema.  Strong peripheral pulses.  Mental Status: No depression, anxiety, nor agitation. Skin: Warm and dry.  Assessment & Plan: Courtney Costa was seen today for breast infection.  Diagnoses and associated orders for this visit:  Breast infection - sulfamethoxazole-trimethoprim (SEPTRA DS) 800-160 MG per tablet; One by mouth twice a day for ten days. - oxyCODONE-acetaminophen (PERCOCET) 10-325 MG per tablet; Take 1 tablet by mouth every 8 (eight) hours as needed for pain. - Wound culture    Breast infection, fortunately I do not think that she has an abscess. I agree with her continuing on Keflex however I would like her to start Bactrim for MRSA coverage since she's having what looks to be purulent discharge at home. A culture was obtained from this fluid and the patient will be updated once results are available. She was been taking hydrocodone at home with only minimal improvement of pain and therefore Percocet was provided.   Return if symptoms worsen or fail to improve.

## 2014-08-28 LAB — WOUND CULTURE
Gram Stain: NONE SEEN
Gram Stain: NONE SEEN
Gram Stain: NONE SEEN
Organism ID, Bacteria: NO GROWTH

## 2014-10-03 ENCOUNTER — Other Ambulatory Visit: Payer: Self-pay | Admitting: Physician Assistant

## 2014-12-06 ENCOUNTER — Encounter: Payer: Self-pay | Admitting: *Deleted

## 2014-12-06 ENCOUNTER — Emergency Department
Admission: EM | Admit: 2014-12-06 | Discharge: 2014-12-06 | Disposition: A | Discharge status: Home / Self Care | Payer: BLUE CROSS/BLUE SHIELD | Source: Home / Self Care | Attending: Emergency Medicine | Admitting: Emergency Medicine

## 2014-12-06 DIAGNOSIS — J1189 Influenza due to unidentified influenza virus with other manifestations: Secondary | ICD-10-CM | POA: Diagnosis not present

## 2014-12-06 DIAGNOSIS — J111 Influenza due to unidentified influenza virus with other respiratory manifestations: Secondary | ICD-10-CM

## 2014-12-06 DIAGNOSIS — J029 Acute pharyngitis, unspecified: Secondary | ICD-10-CM | POA: Diagnosis not present

## 2014-12-06 LAB — POCT RAPID STREP A (OFFICE): Rapid Strep A Screen: NEGATIVE

## 2014-12-06 MED ORDER — PROMETHAZINE-CODEINE 6.25-10 MG/5ML PO SYRP: ORAL_SOLUTION | ORAL | Status: DC

## 2014-12-06 MED ORDER — OSELTAMIVIR PHOSPHATE 75 MG PO CAPS: ORAL_CAPSULE | ORAL | Status: DC

## 2014-12-06 NOTE — ED Notes (Signed)
Pt c/o 2 days of sore throat, body aches, low grade fever and cough.

## 2014-12-06 NOTE — ED Provider Notes (Signed)
CSN: [id number]     Arrival date & time 12/06/14  1429 History   First MD Initiated Contact with Patient 12/06/14 1500     Chief Complaint  Patient presents with  . Sore Throat  . Generalized Body Aches   (Consider location/radiation/quality/duration/timing/severity/associated sxs/prior Treatment) HPI FLU  HPI : Flu symptoms for about 1 day. Fever to 102 with chills, sweats, myalgias, fatigue, headache. Symptoms are progressively worsening, despite trying OTC fever reducing medicine and rest and fluids. Has decreased appetite, but tolerating some liquids by mouth. No history of recent tick bite. Husband diagnosed with influenza 5 days ago and he is recovering  Review of Systems: Positive for fatigue, mild nasal congestion, + sore throat, mild swollen anterior neck glands, mild cough, productive of clear sputum Negative for acute vision changes, stiff neck, focal weakness, syncope, seizures, respiratory distress, vomiting, diarrhea, GU symptoms, new Rash.  Remainder of Review of Systems negative for acute change except as noted in the HPI.   Past Medical History  Diagnosis Date  . Spondylisthesis 05/16/2012  . Spondylisthesis 05/16/2012   Past Surgical History  Procedure Laterality Date  . Breast reduction surgery  08/2014   Family History  Problem Relation Age of Onset  . Hypertension Father    History  Substance Use Topics  . Smoking status: Never Smoker   . Smokeless tobacco: Never Used  . Alcohol Use: No   OB History    No data available     Review of Systems  Allergies  Vagisil  Home Medications   Prior to Admission medications   Medication Sig Start Date End Date Taking? Authorizing Provider  citalopram (CELEXA) 10 MG tablet TAKE 1 TABLET (10 MG TOTAL) BY MOUTH DAILY. 10/04/14   Jade L Breeback, PA-C  clobetasol (TEMOVATE) 0.05 % GEL APPLY TO IRRITATED SKIN TWICE A DAY FOR NO MORE THAN A WEEK AS NEEDED. 03/06/13   Sean Hommel, DO  clonazePAM (KLONOPIN) 0.5 MG  tablet Take 1 tablet (0.5 mg total) by mouth 2 (two) times daily as needed for anxiety. 07/02/14   Jade L Breeback, PA-C  oseltamivir (TAMIFLU) 75 MG capsule Starting today, take 1 capsule by mouth twice a day for 5 days. 12/06/14   Lajean Manes, MD  phentermine 37.5 MG capsule Take 37.5 mg by mouth every morning.    Historical Provider, MD  promethazine-codeine (PHENERGAN WITH CODEINE) 6.25-10 MG/5ML syrup Take 1-2 teaspoons every 4-6 hours as needed for cough. Caution: May cause drowsiness. 12/06/14   Lajean Manes, MD   BP 127/85 mmHg  Pulse 91  Temp(Src) 98.2 F (36.8 C) (Oral)  Resp 16  Wt 163 lb (73.936 kg)  SpO2 100%  LMP 10/28/2014 Physical Exam  Constitutional: She appears well-developed and well-nourished.  Non-toxic appearance. She appears ill (very fatigued, but no cardiorespiratory distress). No distress.  HENT:  Head: Normocephalic and atraumatic.  Right Ear: Tympanic membrane and external ear normal.  Left Ear: Tympanic membrane and external ear normal.  Nose: Rhinorrhea present.  Mouth/Throat: Mucous membranes are normal. Posterior oropharyngeal erythema (mild redness ) present. No oropharyngeal exudate.  Eyes: Conjunctivae are normal. Right eye exhibits no discharge. Left eye exhibits no discharge. No scleral icterus.  Neck: Neck supple.  Cardiovascular: Normal rate, regular rhythm and normal heart sounds.   Pulmonary/Chest: Breath sounds normal. No stridor. No respiratory distress. She has no wheezes. She has no rales.  Abdominal: Soft. There is no tenderness.  Musculoskeletal: She exhibits no edema.  Lymphadenopathy:    She has  cervical adenopathy (mild shoddy anterior cervical nodes).  Neurological: She is alert.  Skin: Skin is warm and intact. No rash noted. She is diaphoretic.  Psychiatric: She has a normal mood and affect.  Nursing note and vitals reviewed.   ED Course  Procedures (including critical care time) Labs Review Labs Reviewed  POCT RAPID STREP A  (OFFICE)   rapid strep test negative  Imaging Review No results found.   MDM   1. Influenza with respiratory manifestations   2. Acute pharyngitis, unspecified pharyngitis type    we discussed pros and cons of testing for influenza, and she declined influenza testing, as clinically she has classical signs and symptoms of influenza and we are still in a mild flu epidemic in [place]  [place] Pres[place]   OSELTAMIVIR (TAMIFLU) 75 MG CAPSULE    Starting today, take 1 capsule by mouth twice a day for 5 days.   PROMETHAZINE-CODEINE (PHENERGAN WITH CODEINE) 6.25-10 MG/5ML SYRUP    Take 1-2 teaspoons every 4-6 hours as needed for cough. Caution: May cause drowsiness.   handout on flu given. Other symptomatic care discussed.Follow-up with your primary care doctor in 5-7 days if not improving, or sooner if symptoms become worse. Precautions discussed. Red flags discussed. Questions invited and answered. Patient voiced understanding and agreement.     Lajean Manesavid Massey, MD 12/06/14 [phone]

## 2014-12-09 ENCOUNTER — Telehealth: Payer: Self-pay | Admitting: Emergency Medicine

## 2015-01-28 ENCOUNTER — Encounter: Payer: Self-pay | Admitting: Family Medicine

## 2015-01-28 ENCOUNTER — Ambulatory Visit (INDEPENDENT_AMBULATORY_CARE_PROVIDER_SITE_OTHER): Payer: BLUE CROSS/BLUE SHIELD | Admitting: Family Medicine

## 2015-01-28 VITALS — BP 122/83 | HR 85 | Wt 171.0 lb

## 2015-01-28 DIAGNOSIS — E669 Obesity, unspecified: Secondary | ICD-10-CM

## 2015-01-28 DIAGNOSIS — M25512 Pain in left shoulder: Secondary | ICD-10-CM | POA: Diagnosis not present

## 2015-01-28 MED ORDER — PHENTERMINE-TOPIRAMATE ER 3.75-23 MG PO CP24: ORAL_CAPSULE | ORAL | Status: DC

## 2015-01-28 MED ORDER — PHENTERMINE-TOPIRAMATE ER 7.5-46 MG PO CP24: ORAL_CAPSULE | ORAL | Status: DC

## 2015-01-28 NOTE — Progress Notes (Signed)
CC: Courtney Costa is a 39 y.o. female is here for No chief complaint on file.   Subjective: HPI:  Left shoulder pain that has been present on a daily basis for at least 3 months. Worse with lying on the left shoulder. Worse when trying to scratch her back. Difficulty with elevating the arm beyond 90 of abduction. No interventions as of yet. She's been waiting for her to get better. She denies any recent trauma or overexertion. She's never had this before. Denies joint pain elsewhere. No motor or sensory disturbances in the left upper extremity. Denies any swelling redness or warmth of the left shoulder. No neck pain or headache.  Complains of difficulty losing weight for the majority of her life. She's had success with phentermine in the past however it became ineffective in the winter of 2015. She's never tried any medications other than this for weight loss. She is working out most days of the week. She tried to focus on diet and exercise but feels like she's plateaued over the last month.   Review Of Systems Outlined In HPI  Past Medical History  Diagnosis Date  . Spondylisthesis 05/16/2012  . Spondylisthesis 05/16/2012    Past Surgical History  Procedure Laterality Date  . Breast reduction surgery  08/2014   Family History  Problem Relation Age of Onset  . Hypertension Father     History   Social History  . Marital Status: Married    Spouse Name: N/A  . Number of Children: N/A  . Years of Education: N/A   Occupational History  . Not on file.   Social History Main Topics  . Smoking status: Never Smoker   . Smokeless tobacco: Never Used  . Alcohol Use: No  . Drug Use: No  . Sexual Activity: Not on file   Other Topics Concern  . Not on file   Social History Narrative     Objective: BP 122/83 mmHg  Pulse 85  Wt 171 lb (77.565 kg)  Vital signs reviewed. General: Alert and Oriented, No Acute Distress HEENT: Pupils equal, round, reactive to light.  Conjunctivae clear.  External ears unremarkable.  Moist mucous membranes. Lungs: Clear and comfortable work of breathing, speaking in full sentences without accessory muscle use. Cardiac: Regular rate and rhythm.  Neuro: CN II-XII grossly intact, gait normal. Extremities: No peripheral edema.  Strong peripheral pulses. Left shoulder exam reveals full range of motion and strength in all planes of motion and with individual rotator cuff testing. No overlying redness warmth or swelling.  Neer's test negative.  Hawkins test positive. Empty can positive. Crossarm test negative. O'Brien's test negative. Apprehension test negative. Speed's test negative.  Mental Status: No depression, anxiety, nor agitation. Logical though process. Skin: Warm and dry.  Assessment & Plan: Diagnoses and all orders for this visit:  Obesity Orders: -     Phentermine-Topiramate (QSYMIA) 7.5-46 MG CP24; One by mouth daily after finishing the 3.75/23mg  formulation.  Left shoulder pain  Other orders -     Phentermine-Topiramate (QSYMIA) 3.75-23 MG CP24; One by mouth daily for fourteen days then switch to increased dose.   Left shoulder pain suspicious for rotator cuff pathology such as tendinitis therefore offered steroid injection today which she would like. Obesity: Discussed different medication options she would like to go on Qsymia, return in one and 1 half months continue diet and exercise interventions  Return in about 1 month (around 02/28/2015). Subacromial Shoulder Injection Procedure Note  Pre-operative Diagnosis: left shoulder  pain  Post-operative Diagnosis: same  Indications: persistent pain  Anesthesia: topical cold spray  Procedure Details   Verbal consent was obtained for the procedure. The shoulder was prepped with alcohol and the skin was anesthetized. A 27 gauge needle was advanced into the subacromial space through posterior approach without difficulty  The space was then injected with 2 ml  1% lidocaine and 2 ml of triamcinolone (KENALOG) 40mg /ml. The injection site was cleansed with isopropyl alcohol and a dressing was applied.  Complications:  None; patient tolerated the procedure well.

## 2015-01-29 ENCOUNTER — Telehealth: Payer: Self-pay | Admitting: Family Medicine

## 2015-01-29 NOTE — Telephone Encounter (Signed)
Received Fax for prior authorization on Qsymia. I called [employer] they faxed me a form I completed the form and faxed it back Now waiting on authorization. - CF

## 2015-01-31 NOTE — Telephone Encounter (Signed)
Received fax from [employer] they approved Qsymia from 01/29/2015 - 08/02/2015. Case ID [id number] - CF

## 2015-05-06 ENCOUNTER — Ambulatory Visit (INDEPENDENT_AMBULATORY_CARE_PROVIDER_SITE_OTHER): Payer: BLUE CROSS/BLUE SHIELD | Admitting: Family Medicine

## 2015-05-06 ENCOUNTER — Encounter: Payer: Self-pay | Admitting: Family Medicine

## 2015-05-06 VITALS — BP 132/88 | HR 82 | Wt 181.0 lb

## 2015-05-06 DIAGNOSIS — R770 Abnormality of albumin: Secondary | ICD-10-CM | POA: Diagnosis not present

## 2015-05-06 DIAGNOSIS — R609 Edema, unspecified: Secondary | ICD-10-CM

## 2015-05-06 NOTE — Progress Notes (Signed)
CC: Courtney Costa is a 39 y.o. female is here for hospital f/u   Subjective: HPI:  For the past 3-4 weeks she's had ankle swelling. This is always symmetric. It's absent first thing in the morning and is worse the longer she is on her feet. It's becoming painful only when the swelling is present. She denies any change in her diet or medications. She was on a medication that was causing diarrhea to help with weight loss however cause dehydration and she was seen at a local emergency room where she required fluids. At that visit sure albumen level was found to be somewhat low and her hepatic enzymes were slightly elevated. She states that symptoms have been persistent and that she is experiencing it at least every day. She's R to try to cut out as much salt in her diet as possible. She denies orthopnea nor chest discomfort. Denies fevers, chills, abdominal pain, nor swelling elsewhere. No overlying skin changes   Review Of Systems Outlined In HPI  Past Medical History  Diagnosis Date  . Spondylisthesis 05/16/2012  . Spondylisthesis 05/16/2012    Past Surgical History  Procedure Laterality Date  . Breast reduction surgery  08/2014   Family History  Problem Relation Age of Onset  . Hypertension Father     Social History   Social History  . Marital Status: Married    Spouse Name: N/A  . Number of Children: N/A  . Years of Education: N/A   Occupational History  . Not on file.   Social History Main Topics  . Smoking status: Never Smoker   . Smokeless tobacco: Never Used  . Alcohol Use: No  . Drug Use: No  . Sexual Activity: Not on file   Other Topics Concern  . Not on file   Social History Narrative     Objective: BP 132/88 mmHg  Pulse 82  Wt 181 lb (82.101 kg)  General: Alert and Oriented, No Acute Distress HEENT: Pupils equal, round, reactive to light. Conjunctivae clear.  Moist mucous membranes Lungs: Clear to auscultation bilaterally, no  wheezing/ronchi/rales.  Comfortable work of breathing. Good air movement. Cardiac: Regular rate and rhythm. Normal S1/S2.  No murmurs, rubs, nor gallops.   Abdomen: Normal bowel sounds, soft and non tender without palpable masses. Extremities: No peripheral edema.  Strong peripheral pulses.  Mental Status: No depression, anxiety, nor agitation. Skin: Warm and dry.  Assessment & Plan: Paytyn was seen today for hospital f/u.  Diagnoses and all orders for this visit:  Edema -     COMPLETE METABOLIC PANEL WITH GFR -     B Nat Peptide  Abnormality of albumin -     COMPLETE METABOLIC PANEL WITH GFR -     B Nat Peptide   Edema: Rechecking albumen, discussed that a low albumen level will lead to some swelling however given the degree of her swelling had also like to rule out congestive heart failure. If labs are normal I plan on offering her Lasix to take on an as-needed basis  Return if symptoms worsen or fail to improve.

## 2015-05-07 ENCOUNTER — Telehealth: Payer: Self-pay | Admitting: Family Medicine

## 2015-05-07 LAB — COMPLETE METABOLIC PANEL WITH GFR
ALT: 34 U/L — ABNORMAL HIGH (ref 6–29)
AST: 23 U/L (ref 10–30)
Albumin: 4 g/dL (ref 3.6–5.1)
Alkaline Phosphatase: 61 U/L (ref 33–115)
BUN: 11 mg/dL (ref 7–25)
CO2: 26 mmol/L (ref 20–31)
Calcium: 9 mg/dL (ref 8.6–10.2)
Chloride: 101 mmol/L (ref 98–110)
Creat: 0.61 mg/dL (ref 0.50–1.10)
GFR, Est African American: >89 mL/min (ref >=60)
GFR, Est Non African American: >89 mL/min (ref >=60)
Glucose, Bld: 85 mg/dL (ref 65–99)
Potassium: 4.2 mmol/L (ref 3.5–5.3)
Sodium: 139 mmol/L (ref 135–146)
Total Bilirubin: 0.5 mg/dL (ref 0.2–1.2)
Total Protein: 6.2 g/dL (ref 6.1–8.1)

## 2015-05-07 LAB — BRAIN NATRIURETIC PEPTIDE: Brain Natriuretic Peptide: 15.8 pg/mL (ref 0.0–100.0)

## 2015-05-07 MED ORDER — FUROSEMIDE 20 MG PO TABS: 20.0000 mg | ORAL_TABLET | Freq: Every day | ORAL | Status: DC | PRN

## 2015-05-07 NOTE — Telephone Encounter (Signed)
Courtney Costa, Will you please let patient know that all her labs were normal therefore it's safe to start a as needed fluid pill called furosemide that I've sent to her pharmacy.

## 2015-05-07 NOTE — Telephone Encounter (Signed)
Left message on pt.'s vm.

## 2015-07-11 ENCOUNTER — Ambulatory Visit (INDEPENDENT_AMBULATORY_CARE_PROVIDER_SITE_OTHER): Payer: BLUE CROSS/BLUE SHIELD | Admitting: Osteopathic Medicine

## 2015-07-11 ENCOUNTER — Encounter: Payer: Self-pay | Admitting: Osteopathic Medicine

## 2015-07-11 VITALS — BP 130/81 | HR 79 | Temp 98.1°F | Wt 165.2 lb

## 2015-07-11 DIAGNOSIS — N939 Abnormal uterine and vaginal bleeding, unspecified: Secondary | ICD-10-CM | POA: Diagnosis not present

## 2015-07-11 DIAGNOSIS — R1011 Right upper quadrant pain: Secondary | ICD-10-CM | POA: Diagnosis not present

## 2015-07-11 LAB — POCT URINE PREGNANCY: Preg Test, Ur: NEGATIVE

## 2015-07-11 MED ORDER — NORETHINDRONE-ETH ESTRADIOL 1-35 MG-MCG PO TABS
ORAL_TABLET | ORAL | Status: DC
Start: 2015-07-11 — End: 2015-09-30

## 2015-07-11 MED ORDER — ONDANSETRON 8 MG PO TBDP: 8.0000 mg | ORAL_TABLET | Freq: Three times a day (TID) | ORAL | Status: DC | PRN

## 2015-07-11 NOTE — Progress Notes (Signed)
HPI: Courtney CanterburyLaura Elizabeth Wicke is a 39 y.o. female who [name]  [place] for chief complaint of:  Chief Complaint  Patient presents with  . Abnormal menses    x 14 days   . Abdominal Pain   . Location: lower abdomen but also pain in RUQ . Quality: Bad cramps, pelvic pain, passing clots . Severity: moderate . Duration: 06/30/15 started period (2 weeks) . Timing: constant bleeding, pain comes and goes with crampy feeling . Assoc signs/symptoms: no constipation/diarrhea, no abnormal appetite, no fever/chils, no abnormal genital discharge.   Normally periods every 21 days, not on birth control  Took Advil last night, nothing today.  Has been at least a month since any intercourse     Past medical, social and family history reviewed: Past Medical History  Diagnosis Date  . Spondylisthesis 05/16/2012  . Spondylisthesis 05/16/2012   Past Surgical History  Procedure Laterality Date  . Breast reduction surgery  08/2014   Social History  Substance Use Topics  . Smoking status: Never Smoker   . Smokeless tobacco: Never Used  . Alcohol Use: No   Family History  Problem Relation Age of Onset  . Hypertension Father     Current Outpatient Prescriptions  Medication Sig Dispense Refill  . furosemide (LASIX) 20 MG tablet Take 1 tablet (20 mg total) by mouth daily as needed (swelling). (Patient not taking: Reported on 07/11/2015) 30 tablet 3  . phentermine 37.5 MG capsule Take 37.5 mg by mouth.     No current facility-administered medications for this visit.   Allergies  Allergen Reactions  . Vagisil [Benzocaine-Resorcinol]       Review of Systems: CONSTITUTIONAL:  No  fever, no chills, No  unintentional weight changes HEAD/EYES/EARS/NOSE/THROAT: No headache, no vision change, no hearing change, No  sore throat CARDIAC: No chest pain, no pressure/palpitations, no orthopnea RESPIRATORY: No  cough, No  shortness of  breath/wheeze GASTROINTESTINAL: No nausea, no vomiting, (+) abdominal pain as per HPI, no blood in stool, no diarrhea, no constipation GENITOURINARY: No incontinence, No abnormal genital discharge or urethral discharge, (+) vaginal bleeding as per HPI HEM/ONC: No easy bruising/bleeding (blood in stool, bloody gums with teeth brushing), no abnormal lymph node ENDOCRINE: No polyuria/polydipsia/polyphagia, no heat/cold intolerance    Exam:  BP 130/81 mmHg  Pulse 79  Temp(Src) 98.1 F (36.7 C)  Wt 165 lb 4 oz (74.957 kg)  LMP 07/11/2015 Constitutional: VSS, see above. General Appearance: alert, well-developed, well-nourished, NAD Neck: No masses, trachea midline. No thyroid enlargement/tenderness/mass appreciated. No lymphadenopathy Respiratory: Normal respiratory effort. no wheeze, no rhonchi, no rales Cardiovascular: S1/S2 normal, no murmur, no rub/gallop auscultated. RRR.  Gastrointestinal: (+) TTP RUQ, LLQ/pelvis, RLQQ/pelvis, no masses. No hepatomegaly, no splenomegaly. No hernia appreciated. Bowel sounds normal. Rectal exam deferred.  GYN: No lesions/ulcers to external genitalia, normal urethra, normal vaginal mucosa, physiologic discharge, cervix normal without lesions, uterus not enlarged or tender, adnexa no masses but (+) mild tender deep palpation R and L adnexa   Results for orders placed or performed in visit on 07/11/15 (from the past 72 hour(s))  POCT urine pregnancy     Status: None   Collection Time: 07/11/15  5:08 PM  Result Value Ref Range   Preg Test, Ur Negative Negative      ASSESSMENT/PLAN:  Abnormal uterine bleeding (AUB) - Plan: norethindrone-ethinyl estradiol 1/35 (ORTHO-NOVUM 1/35, 28,) tablet, ondansetron (ZOFRAN-ODT) 8 MG disintegrating tablet, CBC with Differential/Platelet, COMPLETE METABOLIC PANEL WITH GFR, TSH, Pregnancy, urine,  POCT urine pregnancy, US Pelvis Complete  Right upper quadrant pain - Plan: US Abdomen Complete  Instructions for OCP 1  tablet orally four times per day x 2 - 4 days until bleeding stops then 1 tablet three times per day x 3-7 days then 1 tablet twice per day x 2 days then 1 tablet daily x 3 weeks then  Skip one week - allow withdrawal bleed then Cycle on pills for 3 months  Patient has been educated on significant possible side effects of medication and is instructed to contact me or other medical professional with any concerns about side effects. Nausea will be particular concern, Rx for Zofran   Return in about 4 weeks (around 08/08/2015), or if symptoms worsen or fail to improve. If worse or if significant anemia may need urgent referral to GYN for D&C or other procedure

## 2015-07-11 NOTE — Patient Instructions (Signed)
1 tablet orally four times per day x 2 - 4 days until bleeding stops then 1 tablet three times per day x 3-7 days then 1 tablet twice per day x 2 days then 1 tablet daily x 3 weeks then  Skip one week - allow withdrawal bleed then Cycle on pills for 3 months

## 2015-07-15 ENCOUNTER — Other Ambulatory Visit: Payer: Self-pay | Admitting: Osteopathic Medicine

## 2015-07-15 DIAGNOSIS — N939 Abnormal uterine and vaginal bleeding, unspecified: Secondary | ICD-10-CM

## 2015-08-27 ENCOUNTER — Emergency Department (INDEPENDENT_AMBULATORY_CARE_PROVIDER_SITE_OTHER)
Admission: EM | Admit: 2015-08-27 | Discharge: 2015-08-27 | Disposition: A | Discharge status: Home / Self Care | Payer: BLUE CROSS/BLUE SHIELD | Source: Home / Self Care | Attending: Family Medicine | Admitting: Family Medicine

## 2015-08-27 ENCOUNTER — Encounter: Payer: Self-pay | Admitting: *Deleted

## 2015-08-27 DIAGNOSIS — R112 Nausea with vomiting, unspecified: Secondary | ICD-10-CM

## 2015-08-27 DIAGNOSIS — H8112 Benign paroxysmal vertigo, left ear: Secondary | ICD-10-CM

## 2015-08-27 MED ORDER — PHENTERMINE HCL 37.5 MG PO CAPS: 37.5000 mg | ORAL_CAPSULE | Freq: Every day | ORAL | Status: DC

## 2015-08-27 MED ORDER — ONDANSETRON HCL 4 MG PO TABS
4.0000 mg | ORAL_TABLET | Freq: Once | ORAL | Status: AC
  Administered 2015-08-27: 4 mg via ORAL

## 2015-08-27 MED ORDER — MECLIZINE HCL 25 MG PO TABS: 25.0000 mg | ORAL_TABLET | Freq: Three times a day (TID) | ORAL | Status: DC | PRN

## 2015-08-27 NOTE — ED Notes (Signed)
Pt c/o dizziness, nausea, vomiting and SOB x 0830 today. Denies CP. No OTC meds today. She reports that she has been out of Phentermine x 3 days.

## 2015-08-27 NOTE — ED Provider Notes (Signed)
CSN: [id number]     Arrival date & time 08/27/15  0930 History   First MD Initiated Contact with Patient 08/27/15 1006     Chief Complaint  Patient presents with  . Dizziness  . Emesis      HPI Comments: Patient reports that she awoke today with dizziness (feels "off balance"), fatigue, shortness of breath with activity, nausea, and several episodes of vomiting.  No chest pain.  She reports that she ran out of phentermine 3 days ago.  The history is provided by the patient.    Past Medical History  Diagnosis Date  . Spondylisthesis 05/16/2012  . Spondylisthesis 05/16/2012   Past Surgical History  Procedure Laterality Date  . Breast reduction surgery  08/2014   Family History  Problem Relation Age of Onset  . Hypertension Father   . Cirrhosis Father    Social History  Substance Use Topics  . Smoking status: Never Smoker   . Smokeless tobacco: Never Used  . Alcohol Use: No   OB History    No data available     Review of Systems No sore throat No cough No pleuritic pain No wheezing No nasal congestion No post-nasal drainage No sinus pain/pressure No itchy/red eyes No earache + dizzy No hemoptysis + SOB No fever/chills + nausea + vomiting, resolved No abdominal pain No diarrhea No urinary symptoms No skin rash + fatigue No myalgias No headache Used OTC meds without relief  Allergies  Vagisil  Home Medications   Prior to Admission medications   Medication Sig Start Date End Date Taking? Authorizing Provider  meclizine (ANTIVERT) 25 MG tablet Take 1 tablet (25 mg total) by mouth 3 (three) times daily as needed for dizziness. 08/27/15   Lattie HawStephen A Jylan Loeza, MD  norethindrone-ethinyl estradiol 1/35 (ORTHO-NOVUM 1/35, 28,) tablet As directed 07/11/15   Sunnie NielsenNatalie Alexander, DO  phentermine 37.5 MG capsule Take 1 capsule (37.5 mg total) by mouth daily. 08/27/15   Lattie HawStephen A Oluwasemilore Pascuzzi, MD   Meds Ordered and Administered this Visit   Medications  ondansetron [facility](ZOFRAN)  tablet 4 mg (4 mg Oral Given 08/27/15 1000)    BP 152/91 mmHg  Pulse 75  Temp(Src) 97.6 F (36.4 C) (Oral)  Resp 16  Wt 170 lb (77.111 kg)  SpO2 100%  LMP 08/12/2015 No data found.   Physical Exam Nursing notes and Vital Signs reviewed. Appearance:  Patient appears stated age, and in no acute distress Eyes:  Pupils are equal, round, and reactive to light and accomodation.  Extraocular movement is intact.  Conjunctivae are not inflamed.  Fundi benign.  Nystagmus to left present  Ears:  Canals normal.  Tympanic membranes normal.  Nose:  Normal turbinates.  No sinus tenderness.    Pharynx:  Normal Neck:  Supple.  No adenopathy Lungs:  Clear to auscultation.  Breath sounds are equal.  Moving air well. Heart:  Regular rate and rhythm without murmurs, rubs, or gallops.  Abdomen:  Nontender without masses or hepatosplenomegaly.  Bowel sounds are present.  No CVA or flank tenderness.  Extremities:  No edema.  No calf tenderness Skin:  No rash present.  Neurologic:  Cranial nerves 2 through 12 are normal.  Patellar, achilles, and elbow reflexes are normal.  Cerebellar function is intact (finger-to-nose and rapid alternating hand movement).  Gait and station are normal.  Grip strength symmetric bilaterally.     ED Course  Procedures  none  Labs Review  EKG: Rate:  74 BPM PR:  164 msec QT:  396 msec QTcH:  420 msec QRSD:  102 msec QRS axis:  47 degrees Interpretation:   Normal sinus rhythm; no acute changes      MDM   1. Benign paroxysmal positional vertigo, left   2. Non-intractable vomiting with nausea, unspecified vomiting type; ?withdrawal effect from phentermine    Administered Zofran ODT  po.  Rx for meclizine. Resume phentermine (Rx #15, no refill) Begin clear liquids for about 8 hours, then may begin a BRAT diet (Bananas, Rice, Applesauce, Toast) when nausea and dizziness resolved.  Then gradually advance to a regular diet as tolerated.     If symptoms become  significantly worse during the night or over the weekend, proceed to the local emergency room.  Followup with Family Doctor for phentermine use.    Lattie Haw, MD 09/04/15 [phone]

## 2015-08-27 NOTE — Discharge Instructions (Signed)
Begin clear liquids for about 8 hours, then may begin a BRAT diet (Bananas, Rice, Applesauce, Toast) when nausea and dizziness resolved.  Then gradually advance to a regular diet as tolerated.     If symptoms become significantly worse during the night or over the weekend, proceed to the local emergency room.   Benign Positional Vertigo Vertigo is the feeling that you or your surroundings are moving when they are not. Benign positional vertigo is the most common form of vertigo. The cause of this condition is not serious (is benign). This condition is triggered by certain movements and positions (is positional). This condition can be dangerous if it occurs while you are doing something that could endanger you or others, such as driving.  CAUSES In many cases, the cause of this condition is not known. It may be caused by a disturbance in an area of the inner ear that helps your brain to sense movement and balance. This disturbance can be caused by a viral infection (labyrinthitis), head injury, or repetitive motion. RISK FACTORS This condition is more likely to develop in:  Women.  People who are 1 years of age or older. SYMPTOMS Symptoms of this condition usually happen when you move your head or your eyes in different directions. Symptoms may start suddenly, and they usually last for less than a minute. Symptoms may include:  Loss of balance and falling.  Feeling like you are spinning or moving.  Feeling like your surroundings are spinning or moving.  Nausea and vomiting.  Blurred vision.  Dizziness.  Involuntary eye movement (nystagmus). Symptoms can be mild and cause only slight annoyance, or they can be severe and interfere with daily life. Episodes of benign positional vertigo may return (recur) over time, and they may be triggered by certain movements. Symptoms may improve over time. DIAGNOSIS This condition is usually diagnosed by medical history and a physical exam of the  head, neck, and ears. You may be referred to a health care provider who specializes in ear, nose, and throat (ENT) problems (otolaryngologist) or a provider who specializes in disorders of the nervous system (neurologist). You may have additional testing, including:  MRI.  A CT scan.  Eye movement tests. Your health care provider may ask you to change positions quickly while he or she watches you for symptoms of benign positional vertigo, such as nystagmus. Eye movement may be tested with an electronystagmogram (ENG), caloric stimulation, the Dix-Hallpike test, or the roll test.  An electroencephalogram (EEG). This records electrical activity in your brain.  Hearing tests. TREATMENT Usually, your health care provider will treat this by moving your head in specific positions to adjust your inner ear back to normal. Surgery may be needed in severe cases, but this is rare. In some cases, benign positional vertigo may resolve on its own in 2-4 weeks. HOME CARE INSTRUCTIONS Safety  Move slowly.Avoid sudden body or head movements.  Avoid driving.  Avoid operating heavy machinery.  Avoid doing any tasks that would be dangerous to you or others if a vertigo episode would occur.  If you have trouble walking or keeping your balance, try using a cane for stability. If you feel dizzy or unstable, sit down right away.  Return to your normal activities as told by your health care provider. Ask your health care provider what activities are safe for you. General Instructions  Take over-the-counter and prescription medicines only as told by your health care provider.  Avoid certain positions or movements as told  by your health care provider.  Drink enough fluid to keep your urine clear or pale yellow.  Keep all follow-up visits as told by your health care provider. This is important. SEEK MEDICAL CARE IF:  You have a fever.  Your condition gets worse or you develop new symptoms.  Your  family or friends notice any behavioral changes.  Your nausea or vomiting gets worse.  You have numbness or a "pins and needles" sensation. SEEK IMMEDIATE MEDICAL CARE IF:  You have difficulty speaking or moving.  You are always dizzy.  You faint.  You develop severe headaches.  You have weakness in your legs or arms.  You have changes in your hearing or vision.  You develop a stiff neck.  You develop sensitivity to light.   This information is not intended to replace advice given to you by your health care provider. Make sure you discuss any questions you have with your health care provider.   Document Released: 06/01/2006 Document Revised: 05/15/2015 Document Reviewed: 12/17/2014 Elsevier Interactive Patient Education [employer]2016 Elsevier Inc.

## 2015-09-30 ENCOUNTER — Ambulatory Visit: Payer: BLUE CROSS/BLUE SHIELD | Admitting: Family Medicine

## 2015-09-30 ENCOUNTER — Ambulatory Visit (INDEPENDENT_AMBULATORY_CARE_PROVIDER_SITE_OTHER): Payer: BLUE CROSS/BLUE SHIELD

## 2015-09-30 ENCOUNTER — Ambulatory Visit (INDEPENDENT_AMBULATORY_CARE_PROVIDER_SITE_OTHER): Payer: BLUE CROSS/BLUE SHIELD | Admitting: Family Medicine

## 2015-09-30 ENCOUNTER — Encounter: Payer: Self-pay | Admitting: Family Medicine

## 2015-09-30 VITALS — BP 132/88 | HR 89 | Wt 169.0 lb

## 2015-09-30 DIAGNOSIS — R635 Abnormal weight gain: Secondary | ICD-10-CM

## 2015-09-30 DIAGNOSIS — M25512 Pain in left shoulder: Secondary | ICD-10-CM | POA: Diagnosis not present

## 2015-09-30 MED ORDER — PHENTERMINE HCL 37.5 MG PO CAPS: 37.5000 mg | ORAL_CAPSULE | Freq: Every day | ORAL | Status: DC

## 2015-09-30 NOTE — Progress Notes (Signed)
CC: Courtney Costa is a 40 y.o. female is here for Shoulder Pain   Subjective: HPI:  Left shoulder pain that she's been experiencing ever since last year. 2 days after a steroid injection with me in May she reports almost 100% resolution until the fall of  2016. Since it is slowly getting worse. His localized deep in the shoulder and radiates to the back of the shoulder blade. It's worse with any abduction of her shoulder or with internal rotation. No over-the-counter medications attempted yet. No other interventions other than above. She denies any other joint pain. She denies swelling redness or warmth of the joint. Denies chest pain or shortness of breath.  She is requesting a refill on phentermine to help with weight loss.   Review Of Systems Outlined In HPI  Past Medical History  Diagnosis Date  . Spondylisthesis 05/16/2012  . Spondylisthesis 05/16/2012    Past Surgical History  Procedure Laterality Date  . Breast reduction surgery  08/2014   Family History  Problem Relation Age of Onset  . Hypertension Father   . Cirrhosis Father     Social History   Social History  . Marital Status: Married    Spouse Name: N/A  . Number of Children: N/A  . Years of Education: N/A   Occupational History  . Not on file.   Social History Main Topics  . Smoking status: Never Smoker   . Smokeless tobacco: Never Used  . Alcohol Use: No  . Drug Use: No  . Sexual Activity: Not on file   Other Topics Concern  . Not on file   Social History Narrative     Objective: BP 132/88 mmHg  Pulse 89  Wt 169 lb (76.658 kg)  General: Alert and Oriented, No Acute Distress HEENT: Pupils equal, round, reactive to light. Conjunctivae clear.  Moist mucousmembranes Lungs: Clear to auscultation bilaterally, no wheezing/ronchi/rales.  Comfortable work of breathing. Good air movement. Cardiac: Regular rate and rhythm. Normal S1/S2.  No murmurs, rubs, nor gallops.   Left shoulder exam reveals  full range of motion and strength in all planes of motion and with individual rotator cuff testing. No overlying redness warmth or swelling.  Neer's test negative.  Hawkins test positive. Empty can negative. Crossarm test negative. O'Brien's test negative. Apprehension test negative. Speed's test negative. Extremities: No peripheral edema.  Strong peripheral pulses.  Mental Status: No depression, anxiety, nor agitation. Skin: Warm and dry.  Assessment & Plan: Salvatrice was seen today for shoulder pain.  Diagnoses and all orders for this visit:  Left shoulder pain  Abnormal weight gain -     DG Shoulder Left; Future  Other orders -     phentermine 37.5 MG capsule; Take 1 capsule (37.5 mg total) by mouth daily.   Left shoulder pain suspicious for impingement syndrome, repeating steroid injection today. Checking x-rays to rule out acromioclavicular bone spurring. Successful weight loss with phentermine, refill provided  Subacromial Shoulder Injection Procedure Note  Pre-operative Diagnosis: left impingement syndrome  Post-operative Diagnosis: same  Indications: pain  Anesthesia:topical cold spray  Procedure Details   Verbal consent was obtained for the procedure. The shoulder was prepped with alcohol and the skin was anesthetized. A 27 gauge needle was advanced into the subacromial space through posterior approach without difficulty  The space was then injected with 2 ml 1% lidocaine and 2 ml of triamcinolone (KENALOG) /ml. The injection site was cleansed with isopropyl alcohol and a dressing was applied.  Complications:  None; patient tolerated the procedure well.   Return if symptoms worsen or fail to improve.

## 2015-11-14 ENCOUNTER — Ambulatory Visit: Payer: BLUE CROSS/BLUE SHIELD

## 2015-11-15 ENCOUNTER — Ambulatory Visit (INDEPENDENT_AMBULATORY_CARE_PROVIDER_SITE_OTHER): Payer: BLUE CROSS/BLUE SHIELD | Admitting: Family Medicine

## 2015-11-15 VITALS — BP 131/87 | HR 84 | Wt 164.0 lb

## 2015-11-15 DIAGNOSIS — R635 Abnormal weight gain: Secondary | ICD-10-CM

## 2015-11-15 MED ORDER — PHENTERMINE HCL 37.5 MG PO CAPS: 37.5000 mg | ORAL_CAPSULE | Freq: Every day | ORAL | Status: DC

## 2015-11-15 NOTE — Progress Notes (Signed)
Weight loss sucess

## 2015-11-15 NOTE — Progress Notes (Signed)
Courtney CanterburyLaura Elizabeth Costa is here for blood pressure and weight check. Diet and exercise is going well. Denies trouble sleeping or palpitations.       Patient has lost weight and a refill will be sent to the pharmacy. Patient advised to follow up in 1 month for blood pressure and weight check.

## 2015-11-21 ENCOUNTER — Ambulatory Visit: Payer: BLUE CROSS/BLUE SHIELD | Admitting: Family Medicine

## 2015-12-02 ENCOUNTER — Other Ambulatory Visit: Payer: Self-pay | Admitting: Family Medicine

## 2015-12-02 DIAGNOSIS — Z1231 Encounter for screening mammogram for malignant neoplasm of breast: Secondary | ICD-10-CM

## 2015-12-04 ENCOUNTER — Ambulatory Visit (INDEPENDENT_AMBULATORY_CARE_PROVIDER_SITE_OTHER): Payer: BLUE CROSS/BLUE SHIELD

## 2015-12-04 DIAGNOSIS — Z1231 Encounter for screening mammogram for malignant neoplasm of breast: Secondary | ICD-10-CM

## 2016-01-02 ENCOUNTER — Ambulatory Visit: Payer: BLUE CROSS/BLUE SHIELD

## 2016-01-03 ENCOUNTER — Ambulatory Visit (INDEPENDENT_AMBULATORY_CARE_PROVIDER_SITE_OTHER): Payer: BLUE CROSS/BLUE SHIELD | Admitting: Family Medicine

## 2016-01-03 VITALS — BP 136/89 | HR 74 | Wt 169.0 lb

## 2016-01-03 DIAGNOSIS — R635 Abnormal weight gain: Secondary | ICD-10-CM | POA: Diagnosis not present

## 2016-01-03 NOTE — Progress Notes (Signed)
Courtney CanterburyLaura Elizabeth Costa is here for blood pressure and weight check. Diet and exercise is going well. Denies trouble sleeping or palpitations. She is interested in different weight loss treatment.   Patient has not lost weight. A refill will not be sent to pharmacy.

## 2016-01-07 NOTE — Progress Notes (Signed)
Pt.notified

## 2016-02-21 ENCOUNTER — Encounter: Payer: Self-pay | Admitting: Family Medicine

## 2016-02-21 ENCOUNTER — Ambulatory Visit (INDEPENDENT_AMBULATORY_CARE_PROVIDER_SITE_OTHER): Payer: BLUE CROSS/BLUE SHIELD | Admitting: Family Medicine

## 2016-02-21 VITALS — BP 118/80 | HR 73 | Wt 175.0 lb

## 2016-02-21 DIAGNOSIS — E669 Obesity, unspecified: Secondary | ICD-10-CM | POA: Insufficient documentation

## 2016-02-21 DIAGNOSIS — N644 Mastodynia: Secondary | ICD-10-CM

## 2016-02-21 MED ORDER — TOPIRAMATE 50 MG PO TABS: 50.0000 mg | ORAL_TABLET | Freq: Two times a day (BID) | ORAL | Status: DC

## 2016-02-21 MED ORDER — PHENTERMINE HCL 37.5 MG PO TABS: 37.5000 mg | ORAL_TABLET | Freq: Every day | ORAL | Status: DC

## 2016-02-21 MED ORDER — AMITRIPTYLINE HCL 25 MG PO TABS: 25.0000 mg | ORAL_TABLET | Freq: Every day | ORAL | Status: DC

## 2016-02-21 NOTE — Progress Notes (Signed)
. CC: Courtney Costa is a 40 y.o. female is here for No chief complaint on file.   Subjective: HPI:  Bilateral breast pain ever since she had breast reduction a little over a year and a half ago. She had a favorable mammogram a month ago but she's concerned that something could be going on in her breasts. It's described as tenderness and knotty. She denies any overlying skin changes. She denies any fevers, chills or shortness of breath. No pain with breathing or no exertional chest pain. Pain is reproduced with palpating the knots and when wearing tight fitting bras.  She denies any overlying skin changes or breast architectural changes other than that described above. She denies any nipple discharge or retractions. No interventions as of yet.. She denies pain elsewhere.  She's been looking before FDA approved options for long-term weight loss control. She is attracted to Qsymia but would not be able to afford it on a monthly basis. She is trying her best to diet and exercise but isn't making any progress.   Review Of Systems Outlined In HPI  Past Medical History  Diagnosis Date  . Spondylisthesis 05/16/2012  . Spondylisthesis 05/16/2012    Past Surgical History  Procedure Laterality Date  . Breast reduction surgery  08/2014   Family History  Problem Relation Age of Onset  . Hypertension Father   . Cirrhosis Father     Social History   Social History  . Marital Status: Married    Spouse Name: N/A  . Number of Children: N/A  . Years of Education: N/A   Occupational History  . Not on file.   Social History Main Topics  . Smoking status: Never Smoker   . Smokeless tobacco: Never Used  . Alcohol Use: No  . Drug Use: No  . Sexual Activity: Not on file   Other Topics Concern  . Not on file   Social History Narrative     Objective: BP 118/80 mmHg  Pulse 73  Wt 175 lb (79.379 kg)  Vital signs reviewed. General: Alert and Oriented, No Acute Distress HEENT:  Pupils equal, round, reactive to light. Conjunctivae clear.  External ears unremarkable.  Moist mucous membranes. Lungs: Clear and comfortable work of breathing, speaking in full sentences without accessory muscle use. Cardiac: Regular rate and rhythm.  Neuro: CN II-XII grossly intact, gait normal. Extremities: No peripheral edema.  Strong peripheral pulses.  Mental Status: No depression, anxiety, nor agitation. Logical though process. Skin: Warm and dry.  Breast: Patient declined chaperone, the inferior surface of the breast there is some mild nodularity no larger than 4 mm in diameter which are tender to the touch but no signs of abscess. Assessment & Plan: Diagnoses and all orders for this visit:  Breast tenderness -     amitriptyline (ELAVIL) 25 MG tablet; Take 1 tablet (25 mg total) by mouth at bedtime. To help with nerve pain.  Obesity  Other orders -     topiramate (TOPAMAX) 50 MG tablet; Take 1 tablet (50 mg total) by mouth 2 (two) times daily. -     phentermine (ADIPEX-P) 37.5 MG tablet; Take 1 tablet (37.5 mg total) by mouth daily before breakfast.   Breast tenderness: High suspicion for neuropathic pain at the site of her surgical procedure therefore start amitriptyline, if intolerable or ineffective next step would be gabapentin. I've asked her to call me on Friday of next week to let me how things are going. Obesity: Uncontrolled chronic condition, starting  a generic form of Qsymia, prepared her that this may not be as effective as the brand name product itself.   Return in about 4 weeks (around 03/20/2016) for Weight Check.

## 2016-03-26 ENCOUNTER — Encounter: Payer: Self-pay | Admitting: Family Medicine

## 2016-03-26 ENCOUNTER — Ambulatory Visit (INDEPENDENT_AMBULATORY_CARE_PROVIDER_SITE_OTHER): Payer: BLUE CROSS/BLUE SHIELD | Admitting: Family Medicine

## 2016-03-26 VITALS — BP 122/86 | HR 71 | Wt 179.0 lb

## 2016-03-26 DIAGNOSIS — Z3201 Encounter for pregnancy test, result positive: Secondary | ICD-10-CM

## 2016-03-26 LAB — COMPLETE METABOLIC PANEL WITH GFR
ALT: 12 U/L (ref 6–29)
AST: 11 U/L (ref 10–30)
Albumin: 3.8 g/dL (ref 3.6–5.1)
Alkaline Phosphatase: 40 U/L (ref 33–115)
BUN: 9 mg/dL (ref 7–25)
CO2: 24 mmol/L (ref 20–31)
Calcium: 9.3 mg/dL (ref 8.6–10.2)
Chloride: 103 mmol/L (ref 98–110)
Creat: 0.59 mg/dL (ref 0.50–1.10)
GFR, Est African American: >89 mL/min (ref >=60)
GFR, Est Non African American: >89 mL/min (ref >=60)
Glucose, Bld: 78 mg/dL (ref 65–99)
Potassium: 4.4 mmol/L (ref 3.5–5.3)
Sodium: 136 mmol/L (ref 135–146)
Total Bilirubin: 0.6 mg/dL (ref 0.2–1.2)
Total Protein: 6.5 g/dL (ref 6.1–8.1)

## 2016-03-26 LAB — CBC
HCT: 40.3 % (ref 35.0–45.0)
Hemoglobin: 13.4 g/dL (ref 11.7–15.5)
MCH: 29.6 pg (ref 27.0–33.0)
MCHC: 33.3 g/dL (ref 32.0–36.0)
MCV: 89 fL (ref 80.0–100.0)
MPV: 10.3 fL (ref 7.5–12.5)
Platelets: 265 10*3/uL (ref 140–400)
RBC: 4.53 MIL/uL (ref 3.80–5.10)
RDW: 13.5 % (ref 11.0–15.0)
WBC: 9.8 10*3/uL (ref 3.8–10.8)

## 2016-03-26 LAB — HCG, QUANTITATIVE, PREGNANCY: hCG, Beta Chain, Quant, S: 94211.8 m[IU]/mL — ABNORMAL HIGH

## 2016-03-26 LAB — POCT URINE PREGNANCY: Preg Test, Ur: POSITIVE — AB

## 2016-03-26 NOTE — Progress Notes (Signed)
CC: Courtney CanterburyLaura Elizabeth Costa is a 40 y.o. female is here for Possible Pregnancy   Subjective: HPI:  She noticed that she hadn't had a period since May and took a home pregnancy test which was positive. She denies any symptoms that are suggestive of her being pregnant. She denies any vaginal bleeding or lower pelvic pain. She denies any abdominal pain nor any motor or sensory disturbances. She is not taking any medications. She welcomes this pregnancy and would like a test to determine how far along she is.  She has no genitourinary complaints today.   Review Of Systems Outlined In HPI  Past Medical History  Diagnosis Date  . Spondylisthesis 05/16/2012  . Spondylisthesis 05/16/2012    Past Surgical History  Procedure Laterality Date  . Breast reduction surgery  08/2014   Family History  Problem Relation Age of Onset  . Hypertension Father   . Cirrhosis Father     Social History   Social History  . Marital Status: Married    Spouse Name: N/A  . Number of Children: N/A  . Years of Education: N/A   Occupational History  . Not on file.   Social History Main Topics  . Smoking status: Never Smoker   . Smokeless tobacco: Never Used  . Alcohol Use: No  . Drug Use: No  . Sexual Activity: Not on file   Other Topics Concern  . Not on file   Social History Narrative     Objective: BP 122/86 mmHg  Pulse 71  Wt 179 lb (81.194 kg)  Vital signs reviewed. General: Alert and Oriented, No Acute Distress HEENT: Pupils equal, round, reactive to light. Conjunctivae clear.  External ears unremarkable.  Moist mucous membranes. Lungs: Clear and comfortable work of breathing, speaking in full sentences without accessory muscle use. Cardiac: Regular rate and rhythm.  Neuro: CN II-XII grossly intact, gait normal. Extremities: No peripheral edema.  Strong peripheral pulses.  Mental Status: No depression, anxiety, nor agitation. Logical though process. Skin: Warm and dry. Assessment &  Plan: [name] was seen today for possible pregnancy.  Diagnoses and all orders for this visit:  Positive pregnancy test -     CBC -     COMPLETE METABOLIC PANEL WITH GFR -     B-HCG Quant -     POCT urine pregnancy   Implanted test is positive today, I discussed that we can get a beta HCG quantitative test however the predicted time for conception will be somewhat broad. She still interested in getting this. Also screen for anemia, hyperglycemia or liver disease. Based on these results we will test of the plan on having her coming back in a month to discuss transitioning to OB/GYN.  No Follow-up on file.

## 2016-04-20 ENCOUNTER — Encounter: Payer: Self-pay | Admitting: Family Medicine

## 2016-04-20 ENCOUNTER — Ambulatory Visit (INDEPENDENT_AMBULATORY_CARE_PROVIDER_SITE_OTHER): Payer: BLUE CROSS/BLUE SHIELD | Admitting: Family Medicine

## 2016-04-20 DIAGNOSIS — R059 Cough, unspecified: Secondary | ICD-10-CM | POA: Insufficient documentation

## 2016-04-20 DIAGNOSIS — R05 Cough: Secondary | ICD-10-CM

## 2016-04-20 DIAGNOSIS — Z3490 Encounter for supervision of normal pregnancy, unspecified, unspecified trimester: Secondary | ICD-10-CM

## 2016-04-20 DIAGNOSIS — Z3492 Encounter for supervision of normal pregnancy, unspecified, second trimester: Secondary | ICD-10-CM | POA: Insufficient documentation

## 2016-04-20 DIAGNOSIS — Z331 Pregnant state, incidental: Secondary | ICD-10-CM | POA: Diagnosis not present

## 2016-04-20 MED ORDER — RANITIDINE HCL 300 MG PO TABS: 300.0000 mg | ORAL_TABLET | Freq: Two times a day (BID) | ORAL | 3 refills | Status: DC

## 2016-04-20 MED ORDER — IPRATROPIUM BROMIDE 0.06 % NA SOLN: 2.0000 | NASAL | 6 refills | Status: DC | PRN

## 2016-04-20 NOTE — Progress Notes (Signed)
       Courtney Costa is a 40 y.o. femaleJanece Costa who [facility] today for cough. Patient notes a several week history of bothersome cough. Cough tends to be worse at bedtime at night. She denies any wheezing fevers chills nausea vomiting or diarrhea. She does note a mild sore throat. She's tried some over-the-counter medicines which have only been mildly helpful. She notes that she currently is approximately [redacted] weeks pregnant. She has not yet scheduled with OB/GYN.   Past Medical History:  Diagnosis Date  . Spondylisthesis 05/16/2012  . Spondylisthesis 05/16/2012   Past Surgical History:  Procedure Laterality Date  . BREAST REDUCTION SURGERY  08/2014   Social History  Substance Use Topics  . Smoking status: Never Smoker  . Smokeless tobacco: Never Used  . Alcohol use No   family history includes Cirrhosis in her father; Hypertension in her father.  ROS as above:  Medications: Current Outpatient Prescriptions  Medication Sig Dispense Refill  . ipratropium (ATROVENT) 0.06 % nasal spray Place 2 sprays into both nostrils every 4 (four) hours as needed for rhinitis. 10 mL 6  . ranitidine (ZANTAC) 300 MG tablet Take 1 tablet (300 mg total) by mouth 2 (two) times daily. 180 tablet 3   No current facility-administered medications for this visit.    Allergies  Allergen Reactions  . Vagisil [Benzocaine-Resorcinol]      Exam:  BP 115/75   Pulse 81   Temp 97.8 F (36.6 C) (Oral)   Wt 180 lb (81.6 kg)   LMP 09/16/2015   SpO2 98%   BMI 34.01 kg/m  Gen: Well NAD Nontoxic appearing HEENT: EOMI,  MMM posterior pharynx with mild cobblestoning no cervical lymphadenopathy. Lungs: Normal work of breathing. CTABL Heart: RRR no MRG Abd: NABS, Soft. Nondistended, Nontender Exts: Brisk capillary refill, warm and well perfused.   No results found for this or any  previous visit (from the past 24 hour(s)). No results found.    Assessment and Plan: 40 y.o. female with cough. Based on symptoms I suspect silent acid reflux related to her pregnancy. Plan to use ranitidine and Atrovent nasal spray. Use Tylenol as needed. Refer to OB/GYN for prenatal care.   Orders Placed This Encounter  Procedures  . Ambulatory referral to Obstetrics / Gynecology    Referral Priority:   Routine    Referral Type:   Consultation    Referral Reason:   Specialty Services Required    Requested Specialty:   Obstetrics and Gynecology    Number of Visits Requested:   1    Discussed warning signs or symptoms. Please see discharge instructions. Patient expresses understanding.

## 2016-04-20 NOTE — Patient Instructions (Signed)
Thank you for coming in today. Take tylenol for pain.  Use ranitidine for cough and use the nasal spray.  Follow up with OBGYN. Call [phone] to schedule an appointment.  Let me know if you have trouble getting started.  Call or go to the emergency room if you get worse, have trouble breathing, have chest pains, or palpitations.    Cough, Adult Coughing is a reflex that clears your throat and your airways. Coughing helps to heal and protect your lungs. It is normal to cough occasionally, but a cough that happens with other symptoms or lasts a long time may be a sign of a condition that needs treatment. A cough may last only 2-3 weeks (acute), or it may last longer than 8 weeks (chronic). CAUSES Coughing is commonly caused by:  Breathing in substances that irritate your lungs.  A viral or bacterial respiratory infection.  Allergies.  Asthma.  Postnasal drip.  Smoking.  Acid backing up from the stomach into the esophagus (gastroesophageal reflux).  Certain medicines.  Chronic lung problems, including COPD (or rarely, lung cancer).  Other medical conditions such as heart failure. HOME CARE INSTRUCTIONS  Pay attention to any changes in your symptoms. Take these actions to help with your discomfort:  Take medicines only as told by your health care provider.  If you were prescribed an antibiotic medicine, take it as told by your health care provider. Do not stop taking the antibiotic even if you start to feel better.  Talk with your health care provider before you take a cough suppressant medicine.  Drink enough fluid to keep your urine clear or pale yellow.  If the air is dry, use a cold steam vaporizer or humidifier in your bedroom or your home to help loosen secretions.  Avoid anything that causes you to cough at work or at home.  If your cough is worse at night, try sleeping in a semi-upright position.  Avoid cigarette smoke. If you smoke, quit smoking. If you need  help quitting, ask your health care provider.  Avoid caffeine.  Avoid alcohol.  Rest as needed. SEEK MEDICAL CARE IF:   You have new symptoms.  You cough up pus.  Your cough does not get better after 2-3 weeks, or your cough gets worse.  You cannot control your cough with suppressant medicines and you are losing sleep.  You develop pain that is getting worse or pain that is not controlled with pain medicines.  You have a fever.  You have unexplained weight loss.  You have night sweats. SEEK IMMEDIATE MEDICAL CARE IF:  You cough up blood.  You have difficulty breathing.  Your heartbeat is very fast.   This information is not intended to replace advice given to you by your health care provider. Make sure you discuss any questions you have with your health care provider.   Document Released: 02/20/2011 Document Revised: 05/15/2015 Document Reviewed: 10/31/2014 Elsevier Interactive Patient Education [employer] Inc[employer]

## 2016-04-27 ENCOUNTER — Encounter: Payer: Self-pay | Admitting: Advanced Practice Midwife

## 2016-04-27 ENCOUNTER — Ambulatory Visit (INDEPENDENT_AMBULATORY_CARE_PROVIDER_SITE_OTHER): Payer: BLUE CROSS/BLUE SHIELD | Admitting: Advanced Practice Midwife

## 2016-04-27 ENCOUNTER — Ambulatory Visit: Payer: BLUE CROSS/BLUE SHIELD | Admitting: Family Medicine

## 2016-04-27 VITALS — BP 122/83 | HR 71 | Wt 179.0 lb

## 2016-04-27 DIAGNOSIS — Z9889 Other specified postprocedural states: Secondary | ICD-10-CM

## 2016-04-27 DIAGNOSIS — Z3492 Encounter for supervision of normal pregnancy, unspecified, second trimester: Secondary | ICD-10-CM

## 2016-04-27 DIAGNOSIS — Z124 Encounter for screening for malignant neoplasm of cervix: Secondary | ICD-10-CM | POA: Diagnosis not present

## 2016-04-27 DIAGNOSIS — O09522 Supervision of elderly multigravida, second trimester: Secondary | ICD-10-CM | POA: Diagnosis not present

## 2016-04-27 DIAGNOSIS — Z1151 Encounter for screening for human papillomavirus (HPV): Secondary | ICD-10-CM | POA: Diagnosis not present

## 2016-04-27 DIAGNOSIS — Z36 Encounter for antenatal screening of mother: Secondary | ICD-10-CM

## 2016-04-27 DIAGNOSIS — Z113 Encounter for screening for infections with a predominantly sexual mode of transmission: Secondary | ICD-10-CM | POA: Diagnosis not present

## 2016-04-27 LAB — HIV ANTIBODY (ROUTINE TESTING W REFLEX): HIV 1&2 Ab, 4th Generation: NONREACTIVE

## 2016-04-27 NOTE — Progress Notes (Signed)
Bedside U/S shows IUP with CRL of 68.364mm GA [id number]

## 2016-04-28 ENCOUNTER — Ambulatory Visit: Payer: BLUE CROSS/BLUE SHIELD | Admitting: Family Medicine

## 2016-04-28 ENCOUNTER — Encounter: Payer: Self-pay | Admitting: Advanced Practice Midwife

## 2016-04-28 DIAGNOSIS — Z9889 Other specified postprocedural states: Secondary | ICD-10-CM | POA: Insufficient documentation

## 2016-04-28 DIAGNOSIS — O0990 Supervision of high risk pregnancy, unspecified, unspecified trimester: Secondary | ICD-10-CM | POA: Insufficient documentation

## 2016-04-28 LAB — OBSTETRIC PANEL
Antibody Screen: NEGATIVE
Basophils Absolute: 0 cells/uL (ref 0–200)
Basophils Relative: 0 %
Eosinophils Absolute: 0 cells/uL — ABNORMAL LOW (ref 15–500)
Eosinophils Relative: 0 %
HCT: 40.4 % (ref 35.0–45.0)
Hemoglobin: 13.5 g/dL (ref 11.7–15.5)
Hepatitis B Surface Ag: NEGATIVE
Lymphocytes Relative: 21 %
Lymphs Abs: 2415 cells/uL (ref 850–3900)
MCH: 29.9 pg (ref 27.0–33.0)
MCHC: 33.4 g/dL (ref 32.0–36.0)
MCV: 89.4 fL (ref 80.0–100.0)
MPV: 10.6 fL (ref 7.5–12.5)
Monocytes Absolute: 690 cells/uL (ref 200–950)
Monocytes Relative: 6 %
Neutro Abs: 8395 cells/uL — ABNORMAL HIGH (ref 1500–7800)
Neutrophils Relative %: 73 %
Platelets: 268 10*3/uL (ref 140–400)
RBC: 4.52 MIL/uL (ref 3.80–5.10)
RDW: 13.5 % (ref 11.0–15.0)
Rh Type: POSITIVE
Rubella: 1.15 Index — ABNORMAL HIGH (ref <0.90)
WBC: 11.5 10*3/uL — ABNORMAL HIGH (ref 3.8–10.8)

## 2016-04-28 LAB — CYTOLOGY - PAP

## 2016-04-28 NOTE — Patient Instructions (Signed)

## 2016-04-28 NOTE — Progress Notes (Signed)
  Subjective:    Courtney CanterburyLaura Elizabeth Costa is being seen today for her first obstetrical visit.  This is not a planned pregnancy. She is at 6278w0d gestation. Her obstetrical history is significant for advanced maternal age. Relationship with FOB: spouse, living together. Patient does intend to breast feed. Pregnancy history fully reviewed.  Patient reports no complaints.  Review of Systems:   Review of Systems  Objective:     BP 122/83   Pulse 71   Wt 81.2 kg (179 lb)   LMP 01/21/2016   BMI 33.82 kg/m  Physical Exam  Exam    Assessment:    Pregnancy: [id number] Patient Active Problem List   Diagnosis Date Noted  . Advanced maternal age in multigravida 04/28/2016  . Cough 04/20/2016  . Obesity 02/21/2016  . Anxiety as acute reaction to exceptional stress 07/02/2014  . Insomnia 07/02/2014  . Depression 07/02/2014  . Breast mass, right 04/03/2014  . Spondylisthesis 05/16/2012       Plan:     Initial labs drawn. Prenatal vitamins. Problem list reviewed and updated. AFP3 discussed: NIPS ordered. Role of ultrasound in pregnancy discussed; fetal survey: requested. Amniocentesis discussed: not indicated. Follow up in 4 weeks. 50% of 30 min visit spent on counseling and coordination of care.   Routines reviewed, welcomed to practice Discussed breastfeeding challenges with history of breast reduction Plans NIPS testing   [facility],Courtney Monceaux 04/28/2016

## 2016-04-30 ENCOUNTER — Ambulatory Visit (INDEPENDENT_AMBULATORY_CARE_PROVIDER_SITE_OTHER): Payer: BLUE CROSS/BLUE SHIELD | Admitting: Family Medicine

## 2016-04-30 ENCOUNTER — Encounter: Payer: Self-pay | Admitting: Family Medicine

## 2016-04-30 VITALS — BP 118/79 | HR 74 | Wt 179.0 lb

## 2016-04-30 DIAGNOSIS — L309 Dermatitis, unspecified: Secondary | ICD-10-CM | POA: Diagnosis not present

## 2016-04-30 DIAGNOSIS — O09522 Supervision of elderly multigravida, second trimester: Secondary | ICD-10-CM | POA: Diagnosis not present

## 2016-04-30 DIAGNOSIS — N390 Urinary tract infection, site not specified: Secondary | ICD-10-CM

## 2016-04-30 LAB — CULTURE, URINE COMPREHENSIVE

## 2016-04-30 MED ORDER — TRIAMCINOLONE ACETONIDE 0.1 % EX CREA: TOPICAL_CREAM | CUTANEOUS | 0 refills | Status: DC

## 2016-04-30 NOTE — Progress Notes (Signed)
CC: Courtney Costa is a 40 y.o. female is here for Follow-up   Subjective: HPI:  Patient is requesting a refill on clobetasol. Ever since her second child she's had some trace urinary incontinence. Sometimes this results in a rash between her legs if she is wearing a tight fitting down. Clobetasol helps resolve this within a few days. She's not been doing this or using this for the past month. She denies any urinary urgency hesitancy and the incontinence has not gotten better or worse since her pregnancy. She denies any dysuria or fevers or pelvic pain   Review Of Systems Outlined In HPI  Past Medical History:  Diagnosis Date  . Spondylisthesis 05/16/2012  . Spondylisthesis 05/16/2012    Past Surgical History:  Procedure Laterality Date  . BREAST REDUCTION SURGERY  08/2014   Family History  Problem Relation Age of Onset  . Hypertension Father   . Cirrhosis Father   . Skin cancer Other     Social History   Social History  . Marital status: Married    Spouse name: N/A  . Number of children: N/A  . Years of education: N/A   Occupational History  . Not on file.   Social History Main Topics  . Smoking status: Never Smoker  . Smokeless tobacco: Never Used  . Alcohol use No  . Drug use: No  . Sexual activity: Yes    Birth control/ protection: None   Other Topics Concern  . Not on file   Social History Narrative  . No narrative on file     Objective: LMP 01/21/2016   Vital signs reviewed. General: Alert and Oriented, No Acute Distress HEENT: Pupils equal, round, reactive to light. Conjunctivae clear.  External ears unremarkable.  Moist mucous membranes. Lungs: Clear and comfortable work of breathing, speaking in full sentences without accessory muscle use. Cardiac: Regular rate and rhythm.  Neuro: CN II-XII grossly intact, gait normal. Extremities: No peripheral edema.  Strong peripheral pulses.  Mental Status: No depression, anxiety, nor agitation. Logical  though process. Skin: Warm and dry.  Assessment & Plan: Courtney RiegerLaura was seen today for follow-up.  Diagnoses and all orders for this visit:  UTI (lower urinary tract infection)  Advanced maternal age in multigravida, second trimester   I went over with her that she has a mild presence of bacteria in her urine. The culture is still pending and without any symptoms I would hold off on treatment until culture is ready or if symptoms occur. I do not think that her bacteriuria is causing her incontinence going on for years. I encouraged her to try triamcinolone cream before switching back to Colazal she's pregnant.   No Follow-up on file.

## 2016-05-03 ENCOUNTER — Other Ambulatory Visit: Payer: Self-pay | Admitting: Advanced Practice Midwife

## 2016-05-03 ENCOUNTER — Encounter: Payer: Self-pay | Admitting: Advanced Practice Midwife

## 2016-05-03 DIAGNOSIS — N390 Urinary tract infection, site not specified: Secondary | ICD-10-CM | POA: Insufficient documentation

## 2016-05-03 NOTE — Progress Notes (Signed)
Will have nurse call in augmentin for UTI Cannot eprescribe

## 2016-05-04 ENCOUNTER — Ambulatory Visit (INDEPENDENT_AMBULATORY_CARE_PROVIDER_SITE_OTHER): Payer: BLUE CROSS/BLUE SHIELD | Admitting: Family Medicine

## 2016-05-04 ENCOUNTER — Encounter: Payer: Self-pay | Admitting: Family Medicine

## 2016-05-04 VITALS — BP 122/73 | HR 76 | Temp 98.0°F | Resp 18 | Wt 181.1 lb

## 2016-05-04 DIAGNOSIS — O09522 Supervision of elderly multigravida, second trimester: Secondary | ICD-10-CM | POA: Diagnosis not present

## 2016-05-04 DIAGNOSIS — R12 Heartburn: Secondary | ICD-10-CM | POA: Diagnosis not present

## 2016-05-04 DIAGNOSIS — O26892 Other specified pregnancy related conditions, second trimester: Secondary | ICD-10-CM

## 2016-05-04 DIAGNOSIS — O26899 Other specified pregnancy related conditions, unspecified trimester: Secondary | ICD-10-CM | POA: Insufficient documentation

## 2016-05-04 NOTE — Progress Notes (Signed)
       Courtney CanterburyLaura Elizabeth Costa is a 40 y.o. female who [name] today for establish care. Patient was a primary care patient of another doctor of this clinic who is leaving. She presents to clinic today for an establish care visit. Additionally she notes that she is currently pregnant at about 3514 weeks gestational age. She has had her first visit with OB/GYN recently and the pregnancy is going well. She is G3 P2. She recently had her screening for fetal malformation with the results are still pending.  She notes some heartburn with her current pregnancy. She uses Tums and ranitidine seems to help well.   Past Medical History:  Diagnosis Date  . Spondylisthesis 05/16/2012  . Spondylisthesis 05/16/2012   Past Surgical History:  Procedure Laterality Date  . BREAST REDUCTION SURGERY  08/2014   Social History  Substance Use Topics  . Smoking status: Never Smoker  . Smokeless tobacco: Never Used  . Alcohol use No   family history includes Cirrhosis in her father; Hypertension in her father; Skin cancer in her other.  ROS as above:  Medications: Current Outpatient Prescriptions  Medication Sig Dispense Refill  . ipratropium (ATROVENT) 0.06 % nasal spray Place 2 sprays into both nostrils every 4 (four) hours as needed for rhinitis. 10 mL 6  . Prenatal Vit-Fe Fumarate-FA (MULTIVITAMIN-PRENATAL) 27-0.8 MG TABS tablet Take 1 tablet by mouth daily at 12 noon.    . ranitidine (ZANTAC) 300 MG tablet Take 1 tablet (300 mg total) by mouth 2 (two) times daily. 180 tablet 3  . triamcinolone cream (KENALOG) 0.1 % Apply to affected areas twice a day for up to two weeks, avoid face. 80 g 0   No current facility-administered medications for this visit.    Allergies  Allergen Reactions  . Vagisil [Benzocaine-Resorcinol]      Exam:  BP 122/73 (BP Location: Right Arm, Patient  Position: Sitting, Cuff Size: Normal)   Pulse 76   Temp 98 F (36.7 C) (Oral)   Resp 18   Wt 181 lb 1.9 oz (82.2 kg)   LMP 01/21/2016   SpO2 98%   BMI 34.22 kg/m  Gen: Well NAD HEENT: EOMI,  MMM Lungs: Normal work of breathing. CTABL Heart: RRR no MRG Abd: NABS, Soft. Nondistended, Nontender Gravid. Active fetal motion seen on bedside ultrasound. Exts: Brisk capillary refill, warm and well perfused.   No results found for this or any previous visit (from the past 24 hour(s)). No results found.    Assessment and Plan: 40 y.o. female with well pregnant woman with mild pain is related to acid reflux. Continue ranitidine and Tums. Continue OB/GYN care per OB/GYN. Return as needed.   No orders of the defined types were placed in this encounter.   Discussed warning signs or symptoms. Please see discharge instructions. Patient expresses understanding.

## 2016-05-04 NOTE — Patient Instructions (Signed)
Thank you for coming in today. Return as needed or sooner.  Good luck with your baby.  Let me know if you need anything.  I will be happy to take care of the baby.  Let me know when you get your flu shot this fall.    Heartburn During Pregnancy Heartburn is a burning sensation in the chest caused by stomach acid backing up into the esophagus. Heartburn is common in pregnancy because a certain hormone (progesterone) is released when a woman is pregnant. The progesterone hormone may relax the valve that separates the esophagus from the stomach. This allows acid to go up into the esophagus, causing heartburn. Heartburn may also happen in pregnancy because the enlarging uterus pushes up on the stomach, which pushes more acid into the esophagus. This is especially true in the later stages of pregnancy. Heartburn problems usually go away after giving birth. CAUSES  Heartburn is caused by stomach acid backing up into the esophagus. During pregnancy, this may result from various things, including:   The progesterone hormone.  Changing hormone levels.  The growing uterus pushing stomach acid upward.  Large meals.  Certain foods and drinks.  Exercise.  Increased acid production. SIGNS AND SYMPTOMS   Burning pain in the chest or lower throat.  Bitter taste in the mouth.  Coughing. DIAGNOSIS  Your health care provider will typically diagnose heartburn by taking a careful history of your concern. Blood tests may be done to check for a certain type of bacteria that is associated with heartburn. Sometimes, heartburn is diagnosed by prescribing a heartburn medicine to see if the symptoms improve. In some cases, a procedure called an endoscopy may be done. In this procedure, a tube with a light and a camera on the end (endoscope) is used to examine the esophagus and the stomach. TREATMENT  Treatment will vary depending on the severity of your symptoms. Your health care provider may  recommend:  Over-the-counter medicines (antacids, acid reducers) for mild heartburn.  Prescription medicines to decrease stomach acid or to protect your stomach lining.  Certain changes in your diet.  Elevating the head of your bed by putting blocks under the legs. This helps prevent stomach acid from backing up into the esophagus when you are lying down. HOME CARE INSTRUCTIONS   Only take over-the-counter or prescription medicines as directed by your health care provider.  Raise the head of your bed by putting blocks under the legs if instructed to do so by your health care provider. Sleeping with more pillows is not effective because it only changes the position of your head.  Do not exercise right after eating.  Avoid eating 2-3 hours before bed. Do not lie down right after eating.  Eat small meals throughout the day instead of three large meals.  Identify foods and beverages that make your symptoms worse and avoid them. Foods you may want to avoid include:  Peppers.  Chocolate.  High-fat foods, including fried foods.  Spicy foods.  Garlic and onions.  Citrus fruits, including oranges, grapefruit, lemons, and limes.  Food containing tomatoes or tomato products.  Mint.  Carbonated and caffeinated drinks.  Vinegar. SEEK MEDICAL CARE IF:  You have abdominal pain of any kind.  You feel burning in your upper abdomen or chest, especially after eating or lying down.  You have nausea and vomiting.  Your stomach feels upset after you eat. SEEK IMMEDIATE MEDICAL CARE IF:   You have severe chest pain that goes down your arm  or into your jaw or neck.  You feel sweaty, dizzy, or light-headed.  You become short of breath.  You vomit blood.  You have difficulty or pain with swallowing.  You have bloody or black, tarry stools.  You have episodes of heartburn more than 3 times a week, for more than 2 weeks. MAKE SURE YOU:  Understand these instructions.  Will  watch your condition.  Will get help right away if you are not doing well or get worse.   This information is not intended to replace advice given to you by your health care provider. Make sure you discuss any questions you have with your health care provider.   Document Released: 08/21/2000 Document Revised: 09/14/2014 Document Reviewed: 04/12/2013 Elsevier Interactive Patient Education [employer]

## 2016-05-04 NOTE — Progress Notes (Signed)
Here to establish care

## 2016-05-05 ENCOUNTER — Encounter: Payer: Self-pay | Admitting: *Deleted

## 2016-05-05 ENCOUNTER — Ambulatory Visit (INDEPENDENT_AMBULATORY_CARE_PROVIDER_SITE_OTHER): Payer: BLUE CROSS/BLUE SHIELD | Admitting: Obstetrics & Gynecology

## 2016-05-05 ENCOUNTER — Encounter: Payer: Self-pay | Admitting: Obstetrics & Gynecology

## 2016-05-05 ENCOUNTER — Telehealth: Payer: Self-pay | Admitting: *Deleted

## 2016-05-05 VITALS — BP 110/70 | HR 88 | Resp 16

## 2016-05-05 DIAGNOSIS — Q909 Down syndrome, unspecified: Secondary | ICD-10-CM

## 2016-05-05 DIAGNOSIS — O288 Other abnormal findings on antenatal screening of mother: Secondary | ICD-10-CM

## 2016-05-05 DIAGNOSIS — O09521 Supervision of elderly multigravida, first trimester: Secondary | ICD-10-CM

## 2016-05-05 DIAGNOSIS — O285 Abnormal chromosomal and genetic finding on antenatal screening of mother: Secondary | ICD-10-CM

## 2016-05-05 DIAGNOSIS — O09522 Supervision of elderly multigravida, second trimester: Secondary | ICD-10-CM

## 2016-05-05 MED ORDER — AMOXICILLIN-POT CLAVULANATE 875-125 MG PO TABS: 1.0000 | ORAL_TABLET | Freq: Two times a day (BID) | ORAL | 0 refills | Status: DC

## 2016-05-05 NOTE — Telephone Encounter (Signed)
-----   Message from Aviva SignsMarie L Williams, CNM sent at 05/03/2016 10:09 PM EDT ----- Regarding: UTI I cant eprescribe for some reason  UTI ecoli Resistant to every thing except augmentin  Can you call in Rx Augmentin 875 bid x 7 days? Courtney LiasMarie

## 2016-05-05 NOTE — Telephone Encounter (Signed)
Pt notified of UTI and [facility]  Pt also informed of [facility] results.  Referral made to MFM for genetic counseling and U/S for 05/19/16 @ 8:30.  Pt will see Dr Erin FullingHarraway-Smith today for consultation.

## 2016-05-06 ENCOUNTER — Encounter (HOSPITAL_COMMUNITY): Payer: Self-pay | Admitting: Advanced Practice Midwife

## 2016-05-06 DIAGNOSIS — O3510X0 Maternal care for (suspected) chromosomal abnormality in fetus, unspecified, not applicable or unspecified: Secondary | ICD-10-CM | POA: Insufficient documentation

## 2016-05-06 DIAGNOSIS — O351XX0 Maternal care for (suspected) chromosomal abnormality in fetus, not applicable or unspecified: Secondary | ICD-10-CM | POA: Insufficient documentation

## 2016-05-07 NOTE — Progress Notes (Signed)
Pt presented for discussion of abnormal  Panorama testing. Her testing revealed high risk for trisomy 6521.  I reviewed with pt and her husband what trisomy 1821 is and the different ways it can present in a fetus and some of the assoc anomalies to be aware of including cardiac anomalies.  I have referred them to the genetic counselor to answer more specific questions. They had questions about the specificity and sensitivity of Panorama and they had questions about the amniocentesis.  All of their questions were answered and they will f/u as prev scheduled with the genetic counselor on Sept 12th.  20 min spent in face to face discussion with pt and her husband.  Janisha Bueso L. Harraway-Smith, M.D., Evern CoreFACOG

## 2016-05-12 ENCOUNTER — Encounter (HOSPITAL_COMMUNITY): Payer: Self-pay | Admitting: Advanced Practice Midwife

## 2016-05-16 ENCOUNTER — Other Ambulatory Visit: Payer: Self-pay | Admitting: Family Medicine

## 2016-05-16 DIAGNOSIS — N644 Mastodynia: Secondary | ICD-10-CM

## 2016-05-19 ENCOUNTER — Ambulatory Visit (HOSPITAL_COMMUNITY)
Admission: RE | Admit: 2016-05-19 | Discharge: 2016-05-19 | Disposition: A | Discharge status: Home / Self Care | Payer: BLUE CROSS/BLUE SHIELD | Source: Ambulatory Visit | Attending: Advanced Practice Midwife | Admitting: Advanced Practice Midwife

## 2016-05-19 ENCOUNTER — Other Ambulatory Visit: Payer: Self-pay | Admitting: Advanced Practice Midwife

## 2016-05-19 ENCOUNTER — Encounter (HOSPITAL_COMMUNITY): Payer: Self-pay

## 2016-05-19 VITALS — BP 109/67 | HR 74 | Wt 182.6 lb

## 2016-05-19 DIAGNOSIS — O09522 Supervision of elderly multigravida, second trimester: Secondary | ICD-10-CM

## 2016-05-19 DIAGNOSIS — Z315 Encounter for genetic counseling: Secondary | ICD-10-CM | POA: Diagnosis not present

## 2016-05-19 DIAGNOSIS — O283 Abnormal ultrasonic finding on antenatal screening of mother: Secondary | ICD-10-CM | POA: Insufficient documentation

## 2016-05-19 DIAGNOSIS — O3510X0 Maternal care for (suspected) chromosomal abnormality in fetus, unspecified, not applicable or unspecified: Secondary | ICD-10-CM

## 2016-05-19 DIAGNOSIS — Z3A16 16 weeks gestation of pregnancy: Secondary | ICD-10-CM

## 2016-05-19 DIAGNOSIS — Q909 Down syndrome, unspecified: Secondary | ICD-10-CM

## 2016-05-19 DIAGNOSIS — O289 Unspecified abnormal findings on antenatal screening of mother: Secondary | ICD-10-CM

## 2016-05-19 DIAGNOSIS — O351XX0 Maternal care for (suspected) chromosomal abnormality in fetus, not applicable or unspecified: Secondary | ICD-10-CM

## 2016-05-19 NOTE — Progress Notes (Signed)
Genetic Counseling  High-Risk Gestation Note  Appointment Date:  05/19/2016 Referred By: Aviva Signs, CNM Date of Birth:  Jul 19, 1976 Partner:  Courtney Costa   Pregnancy History: [id number] Estimated Date of Delivery: 11/03/16 Estimated Gestational Age: [redacted]w[redacted]d  [name] and her husband, Mr. Courtney Costa, were seen for genetic counseling because of a high risk Down syndrome result from noninvasive prenatal screening (NIPS)/prenatal cell free DNA testing.   In summary:  Discussed AMA and associated risk for fetal aneuploidy  Reviewed high risk Down syndrome NIPS results ([facility]) and associated 96% positive predictive value   Discussed options for screening  Ultrasound-performed today, follow-up scheduled 06/23/16  Fetal echocardiogram- scheduled 06/26/16  Discussed diagnostic testing options  Amniocentesis- declined  Postnatal chromosome analysis for baby- couple would prefer to pursue this option  Reviewed various associated features of Down syndrome and available national and local support resources  Reviewed family history concerns  Discussed carrier screening options - declined   [name] was offered NIPS ([facility] through [facility]) through her OB office because of advanced maternal age.  The results showed a high risk result for fetal Down syndrome. We discussed that this testing identifies >99% of pregnancies with Down syndrome with a false positive rate of < 0.5%. We reviewed that this testing is highly sensitive and specific, but is not considered diagnostic. We reviewed that the positive predictive value (PPV) is the percentage of those patients who have a positive NIPS result who actually have fetal Down syndrome. New literature Courtney Costa 2014) suggests that the PPV for Down syndrome (based on cytogenetic follow up) is 93% for women who are ~42 years of age. Specific to this laboratory and the patient's age, the PPV is 96%. We spend significant  time reviewing this technology and the accuracy of NIPS and other screening versus diagnostic tests.  They were counseled that given the ultrasound findings and the positive cell free fetal DNA test result, the suspected diagnosis of Down syndrome is likely accurate. We reviewed that the cell free fetal DNA test can not distinguish between aneuploidy confined to the placenta, trisomy 21, translocation 21, or mosaic trisomy 21. We discussed the availability of amniocentesis or peripheral blood chromosome analysis for confirmation of the diagnosis. We reviewed the risks, benefits, and limitations of amniocentesis including the associated 1 in 300-500 risk for complications, including spontaneous pregnancy loss. This couple declined the option of amniocentesis. They wish to pursue postnatal confirmatory testing. We reviewed that karyotype analysis cannot predict the specific features that would or would not be present in an individual with Down syndrome.   They were counseled regarding maternal age and the association with risk for chromosome conditions due to nondisjunction with aging of the ova.   We reviewed chromosomes, nondisjunction, and the associated risk for fetal aneuploidy related to a maternal age. They were counseled that the risk for aneuploidy decreases as gestational age increases, accounting for those pregnancies which spontaneously abort.     Considering the very high suspicion of Down syndrome, they were counseled in detail regarding this diagnosis. We discussed that there are different types of Down syndrome, and each type is determined by the arrangement of the #21 chromosomes. Approximately 95% of cases of Down syndrome are trisomy 21 and 2-4% are due to a translocation involving chromosome 21. We reviewed chromosomes, nondisjunction, and that chromosome division errors happen by chance and are not usually inherited. They understand that confirmatory testing will provide an actual karyotype  and will help to  determine accurate risks of recurrence for a future pregnancy.   This couple was counseled that Down syndrome occurs once per every 800 births and is associated with specific features. However, there is a high degree of variability seen among children who have this condition, meaning that every child with Down syndrome will not be affected in exactly the same way, and some children will have more or less features than others. They were counseled that half of individuals with Down syndrome have a cardiac anomaly and ~10-15% have an intestinal difference. Other anomalies of various organ systems have also been described in association with this diagnosis.   We discussed that in general most individuals with Down syndrome have mild to moderate intellectual disability and likely will require extra assistance with school work. Approximately 70-80% of children with Down syndrome have hypotonia which may lead to delays in sitting, walking, and talking. Hypotonia does tend to improve with age and early intervention services such as physical, occupational, and speech therapies can help with achievement of developmental milestones. We discussed that these therapies are typically provided to children (with qualifying diagnoses) from birth until age 533. We also discussed that Down syndrome is associated with characteristic facial features. Because of these facial features, many children with Down syndrome look similar to each other, but they were reminded that each child with Down syndrome is unique and will have many more features in common with his or her own family members.   We also reviewed that the AAP have established health supervision guidelines for individuals with Down syndrome. These guidelines provide medical management recommendations for various stages of life and can be a resource for families who have a child with Down syndrome as well as for health professionals. We also discussed that  providing children with Down syndrome with a stimulating physical and social environment, as well as ensuring that the child receives adequate medical care and appropriate therapies, will help these children to reach their full potential. With the advances in medical technology, early intervention, and supportive therapies, many individuals with Down syndrome are able to live with an increasing degree of independence. Today, many adults with Down syndrome care for themselves, have jobs, and often times live in group homes or apartments where assistance is available if needed.   We discussed local and national support organizations and offered to connect this couple with another couple who were given a prenatal diagnosis of Down syndrome. Additionally, we discussed that postnatal health management can be coordinated by a medical geneticist as well as with a multidisciplinary team of physicians (Down syndrome clinic). This couple was encouraged to contact us if they have any additional questions/concerns or if they need additional supportive or informational recommendations.   Given the high suspicion for Down syndrome we discussed options for continued management in the current pregnancy including detailed ultrasound and fetal echocardiogram. We discussed pregnancy options including termination of pregnancy or adoption, and the couple stated that they plan to continue the pregnancy. Ultrasound was performed today. Anatomic survey was limited by early fetal gestation. Complete ultrasound results reported separately. Follow-up ultrasound is scheduled for 06/23/16. Fetal echocardi[facility].   Mrs. Courtney CanterburyLaura Elizabeth Danzer was provided with written information regarding cystic fibrosis (CF), spinal muscular atrophy (SMA) and hemoglobinopathies including the carrier frequency, availability of carrier screening and prenatal diagnosis if indicated.  In  addition, we discussed that CF and hemoglobinopathies are routinely screened for as part of the [place]  newborn screening panel.  After further discussion, she declined screening for CF, SMA and hemoglobinopathies.  Both family histories were reviewed and found to be contributory for a paternal first cousin once removed to Mr. Bianca with Down syndrome (unspecified type). Mr. Icenhour reported that he has approximately 100 paternal cousins, and no additional health concerns were reported. We reviewed that 95% of cases of Down syndrome are not inherited and are the result of non-disjunction.  Three to 4% of cases of Down syndrome are the result of a translocation involving chromosome #21.  We discussed the option of chromosome analysis to determine if an individual is a carrier of a balanced translocation involving chromosome #21.  If an individual carries a balanced translocation involving chromosome #21, then the chance to have a baby with Down syndrome would be greater than the maternal age-related risk.  This reported family history is most likely consistent with sporadic Down syndrome. Additional information regarding this relative's karyotype may alter recurrence risk assessment.  Without further information regarding the provided family history, an accurate genetic risk cannot be calculated. Further genetic counseling is warranted if more information is obtained.  Mrs. Kinslee Dalpe denied exposure to environmental toxins or chemical agents. She denied the use of alcohol, tobacco or street drugs. She denied significant viral illnesses during the course of her pregnancy. Her medical and surgical histories were noncontributory.   I counseled this couple regarding the above risks and available options.  The approximate face-to-face time with the genetic counselor was 50 minutes.  Courtney Plowman, MS,  Certified Genetic Counselor 05/19/2016

## 2016-05-25 ENCOUNTER — Encounter: Payer: BLUE CROSS/BLUE SHIELD | Admitting: Certified Nurse Midwife

## 2016-05-25 ENCOUNTER — Ambulatory Visit (INDEPENDENT_AMBULATORY_CARE_PROVIDER_SITE_OTHER): Payer: BLUE CROSS/BLUE SHIELD | Admitting: Obstetrics & Gynecology

## 2016-05-25 VITALS — BP 112/78 | HR 77 | Wt 182.0 lb

## 2016-05-25 DIAGNOSIS — O3510X0 Maternal care for (suspected) chromosomal abnormality in fetus, unspecified, not applicable or unspecified: Secondary | ICD-10-CM

## 2016-05-25 DIAGNOSIS — O09522 Supervision of elderly multigravida, second trimester: Secondary | ICD-10-CM

## 2016-05-25 DIAGNOSIS — O351XX0 Maternal care for (suspected) chromosomal abnormality in fetus, not applicable or unspecified: Secondary | ICD-10-CM

## 2016-05-25 MED ORDER — BUDESONIDE 32 MCG/ACT NA SUSP: 2.0000 | Freq: Every day | NASAL | Status: DC

## 2016-05-25 MED ORDER — ASPIRIN EC 81 MG PO TBEC: 81.0000 mg | DELAYED_RELEASE_TABLET | Freq: Every day | ORAL | 7 refills | Status: DC

## 2016-05-25 MED ORDER — CETIRIZINE HCL 10 MG PO TABS: 10.0000 mg | ORAL_TABLET | Freq: Every day | ORAL | 6 refills | Status: DC

## 2016-05-25 NOTE — Progress Notes (Signed)
   PRENATAL VISIT NOTE  Subjective:  Courtney Costa is a 40 y.o. G3P2002 at 5854w6d being seen today for ongoing prenatal care.  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  Patient reports heartburn.  Contractions: Not present. Vag. Bleeding: None.  Movement: Absent. Denies leaking of fluid.   The following portions of the patient's history were reviewed and updated as appropriate: allergies, current medications, past family history, past medical history, past social history, past surgical history and problem list. Problem list updated.  Objective:   Vitals:   05/25/16 1538  BP: 112/78  Pulse: 77  Weight: 182 lb (82.6 kg)    Fetal Status: Fetal Heart Rate (bpm): 145   Movement: Absent     General:  Alert, oriented and cooperative. Patient is in no acute distress.  Skin: Skin is warm and dry. No rash noted.   Cardiovascular: Normal heart rate noted  Respiratory: Normal respiratory effort, no problems with respiration noted  Abdomen: Soft, gravid, appropriate for gestational age. Pain/Pressure: Absent     Pelvic:  Cervical exam deferred        Extremities: Normal range of motion.  Edema: None  Mental Status: Normal mood and affect. Normal behavior. Normal judgment and thought content.   Urinalysis: Urine Protein: Trace Urine Glucose: Negative  Assessment and Plan:  Pregnancy: G3P2002 at 4654w6d  1. 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 Advanced maternal age in multigravida, second trimester -baby asa to  start -early glucola  3-Allergic rhinitis -Zyrtec -Rhinocort  Preterm labor symptoms and general obstetric precautions including but not limited to vaginal bleeding, contractions, leaking of fluid and fetal movement were reviewed in detail with the patient. Please refer to After Visit Summary for other counseling recommendations.  Return in about 3 weeks (around 06/15/2016).  Lesly DukesKelly H Michoel Kunin, MD

## 2016-05-26 ENCOUNTER — Other Ambulatory Visit (INDEPENDENT_AMBULATORY_CARE_PROVIDER_SITE_OTHER): Payer: BLUE CROSS/BLUE SHIELD

## 2016-05-26 DIAGNOSIS — O09522 Supervision of elderly multigravida, second trimester: Secondary | ICD-10-CM | POA: Diagnosis not present

## 2016-05-27 ENCOUNTER — Telehealth: Payer: Self-pay

## 2016-05-27 LAB — GLUCOSE TOLERANCE, 1 HOUR (50G) W/O FASTING: Glucose, 1 Hr, gestational: 102 mg/dL (ref <140)

## 2016-05-27 NOTE — Telephone Encounter (Signed)
Spoke with pt and let her know her glucose is normal

## 2016-05-30 LAB — HEPATIC FUNCTION PANEL
ALT: 9 U/L (ref 7–35)
AST: 9 U/L — AB (ref 13–35)
Alkaline Phosphatase: 49 U/L (ref 25–125)

## 2016-05-30 LAB — BASIC METABOLIC PANEL: Glucose: 82 mg/dL

## 2016-05-30 LAB — LIPID PANEL
Cholesterol: 289 mg/dL — AB (ref 0–200)
HDL: 86 mg/dL — AB (ref 35–70)
LDL Cholesterol: 161 mg/dL
Triglycerides: 211 mg/dL — AB (ref 40–160)

## 2016-05-30 LAB — TSH: TSH: 2.54 u[IU]/mL (ref 0.41–5.90)

## 2016-06-03 ENCOUNTER — Encounter: Payer: Self-pay | Admitting: Family Medicine

## 2016-06-16 ENCOUNTER — Ambulatory Visit (HOSPITAL_COMMUNITY): Payer: BLUE CROSS/BLUE SHIELD

## 2016-06-22 ENCOUNTER — Encounter (HOSPITAL_COMMUNITY): Payer: Self-pay

## 2016-06-22 ENCOUNTER — Telehealth: Payer: Self-pay

## 2016-06-22 NOTE — Telephone Encounter (Signed)
[facility] and topiramate. Both of these medications were discontinued by Dr Denyse Amassorey. Will route to PCP.   FYI patient is pregnant.

## 2016-06-22 NOTE — Telephone Encounter (Signed)
Topamax is not recommended during pregnancy and amitriptyline is of questionable safety. Patient should return to clinic or have a conversation about safety of these medications with pregnancy. I have left a message with the patient.

## 2016-06-23 ENCOUNTER — Ambulatory Visit (HOSPITAL_COMMUNITY)
Admission: RE | Admit: 2016-06-23 | Discharge: 2016-06-23 | Disposition: A | Discharge status: Home / Self Care | Payer: BLUE CROSS/BLUE SHIELD | Source: Ambulatory Visit | Attending: Advanced Practice Midwife | Admitting: Advanced Practice Midwife

## 2016-06-23 ENCOUNTER — Encounter (HOSPITAL_COMMUNITY): Payer: Self-pay

## 2016-06-23 DIAGNOSIS — O09522 Supervision of elderly multigravida, second trimester: Secondary | ICD-10-CM | POA: Insufficient documentation

## 2016-06-23 DIAGNOSIS — Z3A21 21 weeks gestation of pregnancy: Secondary | ICD-10-CM | POA: Diagnosis not present

## 2016-06-23 DIAGNOSIS — O3510X0 Maternal care for (suspected) chromosomal abnormality in fetus, unspecified, not applicable or unspecified: Secondary | ICD-10-CM

## 2016-06-23 DIAGNOSIS — O351XX0 Maternal care for (suspected) chromosomal abnormality in fetus, not applicable or unspecified: Secondary | ICD-10-CM

## 2016-06-23 DIAGNOSIS — O285 Abnormal chromosomal and genetic finding on antenatal screening of mother: Secondary | ICD-10-CM | POA: Diagnosis not present

## 2016-06-24 ENCOUNTER — Ambulatory Visit (INDEPENDENT_AMBULATORY_CARE_PROVIDER_SITE_OTHER): Payer: BLUE CROSS/BLUE SHIELD | Admitting: Obstetrics & Gynecology

## 2016-06-24 ENCOUNTER — Other Ambulatory Visit (HOSPITAL_COMMUNITY): Payer: Self-pay | Admitting: *Deleted

## 2016-06-24 DIAGNOSIS — O09522 Supervision of elderly multigravida, second trimester: Secondary | ICD-10-CM

## 2016-06-24 DIAGNOSIS — O285 Abnormal chromosomal and genetic finding on antenatal screening of mother: Secondary | ICD-10-CM

## 2016-06-24 DIAGNOSIS — O288 Other abnormal findings on antenatal screening of mother: Secondary | ICD-10-CM

## 2016-06-24 NOTE — Progress Notes (Signed)
   PRENATAL VISIT NOTE  Subjective:  Courtney Costa is a 40 y.o. G3P2002 at 6664w1d being seen today for ongoing prenatal care.  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  Patient reports plantar wart.  Contractions: Not present. Vag. Bleeding: None.  Movement: Present. Denies leaking of fluid.   The following portions of the patient's history were reviewed and updated as appropriate: allergies, current medications, past family history, past medical history, past social history, past surgical history and problem list. Problem list updated.  Objective:   Vitals:   06/24/16 1319  BP: (!) 106/59  Pulse: 81  Weight: 190 lb (86.2 kg)    Fetal Status: Fetal Heart Rate (bpm): 138   Movement: Present     General:  Alert, oriented and cooperative. Patient is in no acute distress.  Skin: Skin is warm and dry. No rash noted.   Cardiovascular: Normal heart rate noted  Respiratory: Normal respiratory effort, no problems with respiration noted  Abdomen: Soft, gravid, appropriate for gestational age. Pain/Pressure: Absent     Pelvic:  Cervical exam deferred        Extremities: Normal range of motion.  Edema: Trace  Mental Status: Normal mood and affect. Normal behavior. Normal judgment and thought content.   Assessment and Plan:  Pregnancy: G3P2002 at 764w1d  1. Elderly multigravida in second trimester -MFM US in one month -Fetal echo scheduled  2.  Plantar Wart -refer to Dr. Denyse Amassorey for wart  3.  Low lying placenta -vaginal rest until next US  Preterm labor symptoms and general obstetric precautions including but not limited  to vaginal bleeding, contractions, leaking of fluid and fetal movement were reviewed in detail with the patient. Please refer to After Visit Summary for other counseling recommendations.  Return in 4 weeks (on 07/22/2016).  Lesly DukesKelly H Jewelz Ricklefs, MD

## 2016-07-01 ENCOUNTER — Ambulatory Visit (INDEPENDENT_AMBULATORY_CARE_PROVIDER_SITE_OTHER): Payer: BLUE CROSS/BLUE SHIELD | Admitting: Family Medicine

## 2016-07-01 ENCOUNTER — Encounter: Payer: Self-pay | Admitting: Family Medicine

## 2016-07-01 VITALS — BP 112/61 | HR 85 | Wt 189.0 lb

## 2016-07-01 DIAGNOSIS — O09522 Supervision of elderly multigravida, second trimester: Secondary | ICD-10-CM | POA: Diagnosis not present

## 2016-07-01 DIAGNOSIS — B070 Plantar wart: Secondary | ICD-10-CM | POA: Diagnosis not present

## 2016-07-01 MED ORDER — PRENATAL 27-0.8 MG PO TABS: 1.0000 | ORAL_TABLET | Freq: Every day | ORAL | 6 refills | Status: DC

## 2016-07-01 MED ORDER — SALICYLIC ACID 27.5 % EX LIQD: CUTANEOUS | 3 refills | Status: DC

## 2016-07-01 NOTE — Progress Notes (Signed)
Courtney Costa is a 40 y.o. female who presents to [facility]: [facility] Care [facility] Medicine today for plantar wart. Patient has a painful plantar wart at the calcaneus of her right foot. His present for about a month. She uses some limited salicylic acid and a nail file. She read the package information on salicylic acid is afraid he is initiated is currently pregnant. She is about [redacted] weeks pregnant.   She notes her initial testing to assess for fetal risk has demonstrated high risk for Down syndrome. She still processing this information. (PPV is 96% for Trisomy 21).   Past Medical History:  Diagnosis Date  . Spondylisthesis 05/16/2012  . Spondylisthesis 05/16/2012   Past Surgical History:  Procedure Laterality Date  . BREAST REDUCTION SURGERY  08/2014   Social History  Substance Use Topics  . Smoking status: Never Smoker  . Smokeless tobacco: Never Used  . Alcohol use No   family history includes Cirrhosis in her father; Hypertension in her father; Skin cancer in her other.  ROS as above:  Medications: Current Outpatient Prescriptions  Medication Sig Dispense Refill  . aspirin EC 81 MG tablet Take 1 tablet (81 mg total) by mouth daily. 30 tablet 7  . cetirizine (ZYRTEC) 10 MG tablet Take 1 tablet (10 mg total) by mouth daily. 30 tablet 6  . Prenatal Vit-Fe Fumarate-FA (MULTIVITAMIN-PRENATAL) 27-0.8 MG TABS tablet Take 1 tablet by mouth daily at 12 noon. 30 each 6  . ranitidine (ZANTAC) 300 MG tablet Take 1 tablet (300 mg total) by mouth 2 (two) times daily. 180 tablet 3  . triamcinolone cream (KENALOG) 0.1 % Apply to affected areas twice a day for up to two weeks, avoid face. 80 g 0  . Salicylic Acid 27.5 % LIQD Apply twice daily to plantar wart 10 mL 3   No current facility-administered medications for this visit.    Allergies  Allergen Reactions  . Vagisil  [Benzocaine-Resorcinol]     Health Maintenance Health Maintenance  Topic Date Due  . PAP SMEAR  04/28/2019  . TETANUS/TDAP  01/07/2023  . INFLUENZA VACCINE  Completed  . HIV Screening  Completed     Exam:  BP 112/61   Pulse 85   Wt 189 lb (85.7 kg)   LMP 01/21/2016   BMI 35.71 kg/m  Gen: Well NAD Right calcaneus small lateral plantar wart mildly tender palpation.   No results found for this or any previous visit (from the past 72 hour(s)). No results found.    Assessment and Plan: 40 y.o. female with tender palpation plantar wart. Recommend topical salicylic acid which should be safe to use in pregnancy. Additionally refer to podiatry for potential surgical excision which should also be safe for pregnancy.   We spent and significant amount of time discussing the risk of Down syndrome. I recommend  "[place]" and reassured Courtney Costa that "Courtney Costa" will have a medical home in this clinic and we'll do everything we can make sure she is as happy and healthy and she can be.   Orders Placed This Encounter  Procedures  . Ambulatory referral to Podiatry    Referral Priority:   Routine    Referral Type:   Consultation    Referral Reason:   Specialty Services Required    Requested Specialty:   Podiatry    Number of Visits Requested:   1    Discussed warning signs or symptoms. Please see discharge instructions.  Patient expresses understanding.

## 2016-07-01 NOTE — Patient Instructions (Addendum)
Thank you for coming in today. I recommend reading the essay [place] about Downs Syndrome.    Apply the salicylic acid to the wart and use a corn pad. You should hear from podiatry very soon.   Plantar Warts Warts are small growths on the skin. They can occur on various areas of the body. When they occur on the underside (sole) of the foot, they are called plantar warts. Plantar warts often occur in groups, with several small warts around a larger growth. They tend to develop over areas of pressure, such as the heel or the ball of the foot. Most warts are not painful, and they usually do not cause problems. However, plantar warts may cause pain when you walk because pressure is applied to them. Warts often go away on their own in time. Various treatments may be done if needed. Sometimes, warts go away and then they come back again. CAUSES Plantar warts are caused by a type of virus that is called human papillomavirus (HPV). HPV attacks a break in the skin of the foot. Walking barefoot can lead to exposure to the virus. These warts may spread to other areas of the sole. They spread to other areas of the body only through direct contact. RISK FACTORS Plantar warts are more likely to develop in:  People who are 2510-42 years of age.  People who use public showers or locker rooms.  People who have a weakened body defense system (immune system). SYMPTOMS Plantar warts may be flat or slightly raised. They may grow into the deeper layers of skin or rise above the surface of the skin. Most plantar warts have a rough surface. They may cause pain when you use your foot to support your body weight. DIAGNOSIS A plantar wart can usually be diagnosed from its appearance. In some cases, a tissue sample may be removed (biopsy) to be looked at under a microscope. TREATMENT In many cases, warts do not need treatment. Without treatment, they often go away over a period of many months to a couple  years. If treatment is needed, options may include:  Applying medicated solutions, creams, or patches to the wart. These may be over-the-counter or prescription medicines that make the skin soft so that layers will gradually shed away. In many cases, the medicine is applied one or two times per day and covered with a bandage.  Putting duct tape over the top of the wart (occlusion). You will leave the tape in place for as long as told by your health care provider, then you will replace it with a new strip of tape. This is done until the wart goes away.  Freezing the wart with liquid nitrogen (cryotherapy).  Burning the wart with:  Laser treatment.  An electrified probe (electrocautery).  Injection of a medicine (Candida antigen) into the wart to help the body's immune system to fight off the wart.  Surgery to remove the wart. HOME CARE INSTRUCTIONS  Apply medicated creams or solutions only as told by your health care provider. This may involve:  Soaking the affected area in warm water.  Removing the top layer of softened skin before you apply the medicine. A pumice stone works well for removing the tissue.  Applying a bandage over the affected area after you apply the medicine.  Repeating the process daily or as told by your health care provider.  Do not scratch or pick at a wart.  Wash your hands after you touch a wart.  If  a wart is painful, try applying a bandage with a hole in the middle over the wart. The helps to take pressure off the wart.  Keep all follow-up visits as told by your health care provider. This is important. PREVENTION Take these actions to help prevent warts:  Wear shoes and socks. Change your socks daily.  Keep your feet clean and dry.  Check your feet regularly.  Avoid direct contact with warts on other people. SEEK MEDICAL CARE IF:  Your warts do not improve after treatment.  You have redness, swelling, or pain at the site of a wart.  You  have bleeding from a wart that does not stop with light pressure.  You have diabetes and you develop a wart.   This information is not intended to replace advice given to you by your health care provider. Make sure you discuss any questions you have with your health care provider.   Document Released: 11/14/2003 Document Revised: 05/15/2015 Document Reviewed: 11/19/2014 Elsevier Interactive Patient Education [employer]

## 2016-07-14 ENCOUNTER — Encounter: Payer: Self-pay | Admitting: Family Medicine

## 2016-07-21 ENCOUNTER — Encounter (HOSPITAL_COMMUNITY): Payer: Self-pay

## 2016-07-21 ENCOUNTER — Ambulatory Visit (HOSPITAL_COMMUNITY)
Admission: RE | Admit: 2016-07-21 | Discharge: 2016-07-21 | Disposition: A | Discharge status: Home / Self Care | Payer: BLUE CROSS/BLUE SHIELD | Source: Ambulatory Visit | Attending: Advanced Practice Midwife | Admitting: Advanced Practice Midwife

## 2016-07-21 ENCOUNTER — Other Ambulatory Visit (HOSPITAL_COMMUNITY): Payer: Self-pay | Admitting: *Deleted

## 2016-07-21 DIAGNOSIS — Z3A25 25 weeks gestation of pregnancy: Secondary | ICD-10-CM | POA: Diagnosis not present

## 2016-07-21 DIAGNOSIS — O285 Abnormal chromosomal and genetic finding on antenatal screening of mother: Secondary | ICD-10-CM

## 2016-07-21 DIAGNOSIS — O09522 Supervision of elderly multigravida, second trimester: Secondary | ICD-10-CM | POA: Diagnosis not present

## 2016-07-21 DIAGNOSIS — O351XX0 Maternal care for (suspected) chromosomal abnormality in fetus, not applicable or unspecified: Secondary | ICD-10-CM | POA: Diagnosis not present

## 2016-07-21 DIAGNOSIS — O288 Other abnormal findings on antenatal screening of mother: Secondary | ICD-10-CM | POA: Diagnosis not present

## 2016-07-22 ENCOUNTER — Ambulatory Visit (INDEPENDENT_AMBULATORY_CARE_PROVIDER_SITE_OTHER): Payer: BLUE CROSS/BLUE SHIELD | Admitting: Obstetrics & Gynecology

## 2016-07-22 VITALS — BP 102/62 | HR 79 | Wt 191.0 lb

## 2016-07-22 DIAGNOSIS — O3510X0 Maternal care for (suspected) chromosomal abnormality in fetus, unspecified, not applicable or unspecified: Secondary | ICD-10-CM

## 2016-07-22 DIAGNOSIS — O09522 Supervision of elderly multigravida, second trimester: Secondary | ICD-10-CM

## 2016-07-22 DIAGNOSIS — O351XX0 Maternal care for (suspected) chromosomal abnormality in fetus, not applicable or unspecified: Secondary | ICD-10-CM

## 2016-07-24 NOTE — Progress Notes (Signed)
   PRENATAL VISIT NOTE  Subjective:  Courtney Costa is a 40 y.o. G3P2002 at 8672w3d being seen today for ongoing prenatal care.  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  Patient reports no complaints.  Contractions: Not present. Vag. Bleeding: None.  Movement: Present. Denies leaking of fluid.   The following portions of the patient's history were reviewed and updated as appropriate: allergies, current medications, past family history, past medical history, past social history, past surgical history and problem list. Problem list updated.  Objective:   Vitals:   07/22/16 1038  BP: 102/62  Pulse: 79  Weight: 191 lb (86.6 kg)    Fetal Status: Fetal Heart Rate (bpm): 145 Fundal Height: 25 cm Movement: Present     General:  Alert, oriented and cooperative. Patient is in no acute distress.  Skin: Skin is warm and dry. No rash noted.   Cardiovascular: Normal heart rate noted  Respiratory: Normal respiratory effort, no problems with respiration noted  Abdomen: Soft, gravid, appropriate for gestational age. Pain/Pressure: Absent     Pelvic:  Cervical exam deferred        Extremities: Normal range of motion.  Edema: Trace  Mental Status: Normal mood and affect. Normal behavior. Normal judgment and thought content.   Assessment and Plan:  Pregnancy: G3P2002 at 9372w3d  AMA--Doing well, no signs of pre-E; Serial growth US with MFM.  T21 on NIPS--Fetal echo nml per patient.    Preterm labor symptoms and general obstetric precautions including but not limited to vaginal bleeding, contractions,  leaking of fluid and fetal movement were reviewed in detail with the patient. Please refer to After Visit Summary for other counseling recommendations.  Return in about 3 weeks (around 08/12/2016).   Lesly DukesKelly H Thom Ollinger, MD

## 2016-07-27 ENCOUNTER — Ambulatory Visit: Payer: Self-pay | Admitting: Podiatry

## 2016-08-03 ENCOUNTER — Ambulatory Visit (INDEPENDENT_AMBULATORY_CARE_PROVIDER_SITE_OTHER): Payer: BLUE CROSS/BLUE SHIELD | Admitting: Podiatry

## 2016-08-03 ENCOUNTER — Encounter: Payer: Self-pay | Admitting: Podiatry

## 2016-08-03 VITALS — Ht 61.0 in | Wt 190.0 lb

## 2016-08-03 DIAGNOSIS — M79671 Pain in right foot: Secondary | ICD-10-CM

## 2016-08-03 DIAGNOSIS — B070 Plantar wart: Secondary | ICD-10-CM | POA: Diagnosis not present

## 2016-08-03 NOTE — Patient Instructions (Signed)
Right foot Plantar wart removed under local anesthetics. Keep the dressing intact for 24 hours. May change the dressing after each shower. Return as needed.

## 2016-08-03 NOTE — Progress Notes (Signed)
SUBJECTIVE: 40 y.o. year old female presents complaining of a painful wart on right plantar heel duration of 2 months. She has used topical Salicylic acid and filing with minimum improvement.  Patient is in 7th month pregnancy with good health and still working.   REVIEW OF SYSTEMS: Pertinent items noted in HPI and remainder of comprehensive ROS otherwise negative.  OBJECTIVE: DERMATOLOGIC EXAMINATION: Plantar skin lesion about 0.5 cm in diameter, which is well outlined and marks of pin point dark sports within the lesion.  No associated redness or edema noted. Pain with weight bearing.  VASCULAR EXAMINATION OF LOWER LIMBS: All pedal pulses are palpable with normal pulsation.  Capillary Filling times within 3 seconds in all digits.   NEUROLOGIC EXAMINATION OF THE LOWER LIMBS: All epicritic and tactile sensations grossly intact.   MUSCULOSKELETAL EXAMINATION: No gross osseous deformities noted.  ASSESSMENT: Plantar wart 0.5 cm in diameter, right foot lateral 1/2, just anterior to heel pad.   PLAN: Reviewed clinical findings and available treatment options. As per request, consent form reviewed for excision plantar wart right foot. Local used: Total 1.5 ml of 0.5% Marcaine with epinephrine. Procedure done: Excision plantar wart right foot, excised lesion is about 0.7 cm in diameter. After the area was anesthetized, surgical site cleansed with Iodine solution. Using sterile technique plantar wart removed using Curette after outlining the lesion with #15 blade. Wound was cleansed with Iodine and Amerigel ointment and compression dressing applied. No Phenol or Alcohol used to the wound.  Patient is to keep the dressing intact and dry for next 24 hours.  Then change the dressing daily after each shower till the base of the wound becomes dry. Return as needed.

## 2016-08-12 ENCOUNTER — Encounter: Payer: BLUE CROSS/BLUE SHIELD | Admitting: Obstetrics & Gynecology

## 2016-08-14 ENCOUNTER — Ambulatory Visit (INDEPENDENT_AMBULATORY_CARE_PROVIDER_SITE_OTHER): Payer: BLUE CROSS/BLUE SHIELD | Admitting: Family

## 2016-08-14 VITALS — BP 118/72 | HR 89 | Wt 193.0 lb

## 2016-08-14 DIAGNOSIS — Z23 Encounter for immunization: Secondary | ICD-10-CM | POA: Diagnosis not present

## 2016-08-14 DIAGNOSIS — O351XX0 Maternal care for (suspected) chromosomal abnormality in fetus, not applicable or unspecified: Secondary | ICD-10-CM

## 2016-08-14 DIAGNOSIS — O09529 Supervision of elderly multigravida, unspecified trimester: Secondary | ICD-10-CM | POA: Diagnosis not present

## 2016-08-14 DIAGNOSIS — O3510X0 Maternal care for (suspected) chromosomal abnormality in fetus, unspecified, not applicable or unspecified: Secondary | ICD-10-CM

## 2016-08-14 DIAGNOSIS — O09523 Supervision of elderly multigravida, third trimester: Secondary | ICD-10-CM | POA: Diagnosis not present

## 2016-08-14 DIAGNOSIS — O0990 Supervision of high risk pregnancy, unspecified, unspecified trimester: Secondary | ICD-10-CM

## 2016-08-14 LAB — CBC
HCT: 32.9 % — ABNORMAL LOW (ref 35.0–45.0)
Hemoglobin: 11 g/dL — ABNORMAL LOW (ref 11.7–15.5)
MCH: 31.2 pg (ref 27.0–33.0)
MCHC: 33.4 g/dL (ref 32.0–36.0)
MCV: 93.2 fL (ref 80.0–100.0)
MPV: 9.6 fL (ref 7.5–12.5)
Platelets: 270 10*3/uL (ref 140–400)
RBC: 3.53 MIL/uL — ABNORMAL LOW (ref 3.80–5.10)
RDW: 13.5 % (ref 11.0–15.0)
WBC: 9.5 10*3/uL (ref 3.8–10.8)

## 2016-08-14 NOTE — Progress Notes (Signed)
   PRENATAL VISIT NOTE  Subjective:  Courtney Costa is a 40 y.o. G3P2002 at 5963w3d being seen today for ongoing prenatal care.  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  Patient reports no complaints.  Contractions: Not present. Vag. Bleeding: None.  Movement: Present. Denies leaking of fluid.   The following portions of the patient's history were reviewed and updated as appropriate: allergies, current medications, past family history, past medical history, past social history, past surgical history and problem list. Problem list updated.  Objective:   Vitals:   08/14/16 0819  BP: 118/72  Pulse: 89  Weight: 193 lb (87.5 kg)    Fetal Status: Fetal Heart Rate (bpm): 138 Fundal Height: 28 cm Movement: Present     General:  Alert, oriented and cooperative. Patient is in no acute distress.  Skin: Skin is warm and dry. No rash noted.   Cardiovascular: Normal heart rate noted  Respiratory: Normal respiratory effort, no problems with respiration noted  Abdomen: Soft, gravid, appropriate for gestational age. Pain/Pressure: Present     Pelvic:  Cervical exam deferred        Extremities: Normal range of motion.  Edema: Trace  Mental Status: Normal mood and affect. Normal behavior. Normal judgment and thought content.   Assessment and Plan:  Pregnancy: G3P2002 at 3163w3d  1. Antepartum multigravida of advanced maternal age - Growth ultrasound scheduled  2. Supervision of high risk pregnancy, antepartum - Glucose Tolerance, 1 HR (50g) - CBC - HIV antibody (with reflex) - RPR - Tdap vaccine  greater than or equal to 7yo IM  3. Suspected chromosome anomaly of fetus affecting management of mother, antepartum, single or unspecified fetus - Continue monitoring - Plans to name her Courtney Costa (middle name from one of her closest friends)  Preterm labor symptoms and general obstetric precautions including but not limited to vaginal bleeding, contractions, leaking of fluid and fetal movement were reviewed in detail with the patient. Please refer to After Visit Summary for other counseling recommendations.  Return in 2 weeks (on 08/28/2016).   Eino FarberWalidah Kennith GainN Karim, CNM

## 2016-08-14 NOTE — Patient Instructions (Signed)
Third Trimester of Pregnancy The third trimester is from week 29 through week 40 (months 7 through 9). The third trimester is a time when the unborn baby (fetus) is growing rapidly. At the end of the ninth month, the fetus is about 20 inches in length and weighs 6-10 pounds. Body changes during your third trimester Your body goes through many changes during pregnancy. The changes vary from woman to woman. During the third trimester:  Your weight will continue to increase. You can expect to gain 25-35 pounds (11-16 kg) by the end of the pregnancy.  You may begin to get stretch marks on your hips, abdomen, and breasts.  You may urinate more often because the fetus is moving lower into your pelvis and pressing on your bladder.  You may develop or continue to have heartburn. This is caused by increased hormones that slow down muscles in the digestive tract.  You may develop or continue to have constipation because increased hormones slow digestion and cause the muscles that push waste through your intestines to relax.  You may develop hemorrhoids. These are swollen veins (varicose veins) in the rectum that can itch or be painful.  You may develop swollen, bulging veins (varicose veins) in your legs.  You may have increased body aches in the pelvis, back, or thighs. This is due to weight gain and increased hormones that are relaxing your joints.  You may have changes in your hair. These can include thickening of your hair, rapid growth, and changes in texture. Some women also have hair loss during or after pregnancy, or hair that feels dry or thin. Your hair will most likely return to normal after your baby is born.  Your breasts will continue to grow and they will continue to become tender. A yellow fluid (colostrum) may leak from your breasts. This is the first milk you are producing for your baby.  Your belly button may stick out.  You may notice more swelling in your hands, face, or  ankles.  You may have increased tingling or numbness in your hands, arms, and legs. The skin on your belly may also feel numb.  You may feel short of breath because of your expanding uterus.  You may have more problems sleeping. This can be caused by the size of your belly, increased need to urinate, and an increase in your body's metabolism.  You may notice the fetus "dropping," or moving lower in your abdomen.  You may have increased vaginal discharge.  Your cervix becomes thin and soft (effaced) near your due date. What to expect at prenatal visits You will have prenatal exams every 2 weeks until week 36. Then you will have weekly prenatal exams. During a routine prenatal visit:  You will be weighed to make sure you and the fetus are growing normally.  Your blood pressure will be taken.  Your abdomen will be measured to track your baby's growth.  The fetal heartbeat will be listened to.  Any test results from the previous visit will be discussed.  You may have a cervical check near your due date to see if you have effaced. At around 36 weeks, your health care provider will check your cervix. At the same time, your health care provider will also perform a test on the secretions of the vaginal tissue. This test is to determine if a type of bacteria, Group B streptococcus, is present. Your health care provider will explain this further. Your health care provider may ask you:    What your birth plan is.  How you are feeling.  If you are feeling the baby move.  If you have had any abnormal symptoms, such as leaking fluid, bleeding, severe headaches, or abdominal cramping.  If you are using any tobacco products, including cigarettes, chewing tobacco, and electronic cigarettes.  If you have any questions. Other tests or screenings that may be performed during your third trimester include:  Blood tests that check for low iron levels (anemia).  Fetal testing to check the health,  activity level, and growth of the fetus. Testing is done if you have certain medical conditions or if there are problems during the pregnancy.  Nonstress test (NST). This test checks the health of your baby to make sure there are no signs of problems, such as the baby not getting enough oxygen. During this test, a belt is placed around your belly. The baby is made to move, and its heart rate is monitored during movement. What is false labor? False labor is a condition in which you feel small, irregular tightenings of the muscles in the womb (contractions) that eventually go away. These are called Braxton Hicks contractions. Contractions may last for hours, days, or even weeks before true labor sets in. If contractions come at regular intervals, become more frequent, increase in intensity, or become painful, you should see your health care provider. What are the signs of labor?  Abdominal cramps.  Regular contractions that start at 10 minutes apart and become stronger and more frequent with time.  Contractions that start on the top of the uterus and spread down to the lower abdomen and back.  Increased pelvic pressure and dull back pain.  A watery or bloody mucus discharge that comes from the vagina.  Leaking of amniotic fluid. This is also known as your "water breaking." It could be a slow trickle or a gush. Let your doctor know if it has a color or strange odor. If you have any of these signs, call your health care provider right away, even if it is before your due date. Follow these instructions at home: Eating and drinking  Continue to eat regular, healthy meals.  Do not eat:  Raw meat or meat spreads.  Unpasteurized milk or cheese.  Unpasteurized juice.  Store-made salad.  Refrigerated smoked seafood.  Hot dogs or deli meat, unless they are piping hot.  More than 6 ounces of albacore tuna a week.  Shark, swordfish, king mackerel, or tile fish.  Store-made salads.  Raw  sprouts, such as mung bean or alfalfa sprouts.  Take prenatal vitamins as told by your health care provider.  Take 1000 mg of calcium daily as told by your health care provider.  If you develop constipation:  Take over-the-counter or prescription medicines.  Drink enough fluid to keep your urine clear or pale yellow.  Eat foods that are high in fiber, such as fresh fruits and vegetables, whole grains, and beans.  Limit foods that are high in fat and processed sugars, such as fried and sweet foods. Activity  Exercise only as directed by your health care provider. Healthy pregnant women should aim for 2 hours and 30 minutes of moderate exercise per week. If you experience any pain or discomfort while exercising, stop.  Avoid heavy lifting.  Do not exercise in extreme heat or humidity, or at high altitudes.  Wear low-heel, comfortable shoes.  Practice good posture.  Do not travel far distances unless it is absolutely necessary and only with the approval   of your health care provider.  Wear your seat belt at all times while in a car, on a bus, or on a plane.  Take frequent breaks and rest with your legs elevated if you have leg cramps or low back pain.  Do not use hot tubs, steam rooms, or saunas.  You may continue to have sex unless your health care provider tells you otherwise. Lifestyle  Do not use any products that contain nicotine or tobacco, such as cigarettes and e-cigarettes. If you need help quitting, ask your health care provider.  Do not drink alcohol.  Do not use any medicinal herbs or unprescribed drugs. These chemicals affect the formation and growth of the baby.  If you develop varicose veins:  Wear support pantyhose or compression stockings as told by your healthcare provider.  Elevate your feet for 15 minutes, 3-4 times a day.  Wear a supportive maternity bra to help with breast tenderness. General instructions  Take over-the-counter and prescription  medicines only as told by your health care provider. There are medicines that are either safe or unsafe to take during pregnancy.  Take warm sitz baths to soothe any pain or discomfort caused by hemorrhoids. Use hemorrhoid cream or witch hazel if your health care provider approves.  Avoid cat litter boxes and soil used by cats. These carry germs that can cause birth defects in the baby. If you have a cat, ask someone to clean the litter box for you.  To prepare for the arrival of your baby:  Take prenatal classes to understand, practice, and ask questions about the labor and delivery.  Make a trial run to the hospital.  Visit the hospital and tour the maternity area.  Arrange for maternity or paternity leave through employers.  Arrange for family and friends to take care of pets while you are in the hospital.  Purchase a rear-facing car seat and make sure you know how to install it in your car.  Pack your hospital bag.  Prepare the baby's nursery. Make sure to remove all pillows and stuffed animals from the baby's crib to prevent suffocation.  Visit your dentist if you have not gone during your pregnancy. Use a soft toothbrush to brush your teeth and be gentle when you floss.  Keep all prenatal follow-up visits as told by your health care provider. This is important. Contact a health care provider if:  You are unsure if you are in labor or if your water has broken.  You become dizzy.  You have mild pelvic cramps, pelvic pressure, or nagging pain in your abdominal area.  You have lower back pain.  You have persistent nausea, vomiting, or diarrhea.  You have an unusual or bad smelling vaginal discharge.  You have pain when you urinate. Get help right away if:  You have a fever.  You are leaking fluid from your vagina.  You have spotting or bleeding from your vagina.  You have severe abdominal pain or cramping.  You have rapid weight loss or weight gain.  You have  shortness of breath with chest pain.  You notice sudden or extreme swelling of your face, hands, ankles, feet, or legs.  Your baby makes fewer than 10 movements in 2 hours.  You have severe headaches that do not go away with medicine.  You have vision changes. Summary  The third trimester is from week 29 through week 40, months 7 through 9. The third trimester is a time when the unborn baby (fetus)   is growing rapidly.  During the third trimester, your discomfort may increase as you and your baby continue to gain weight. You may have abdominal, leg, and back pain, sleeping problems, and an increased need to urinate.  During the third trimester your breasts will keep growing and they will continue to become tender. A yellow fluid (colostrum) may leak from your breasts. This is the first milk you are producing for your baby.  False labor is a condition in which you feel small, irregular tightenings of the muscles in the womb (contractions) that eventually go away. These are called Braxton Hicks contractions. Contractions may last for hours, days, or even weeks before true labor sets in.  Signs of labor can include: abdominal cramps; regular contractions that start at 10 minutes apart and become stronger and more frequent with time; watery or bloody mucus discharge that comes from the vagina; increased pelvic pressure and dull back pain; and leaking of amniotic fluid. This information is not intended to replace advice given to you by your health care provider. Make sure you discuss any questions you have with your health care provider. Document Released: 08/18/2001 Document Revised: 01/30/2016 Document Reviewed: 10/25/2012 Elsevier Interactive Patient Education  2017 Elsevier Inc.  

## 2016-08-15 LAB — HIV ANTIBODY (ROUTINE TESTING W REFLEX): HIV 1&2 Ab, 4th Generation: NONREACTIVE

## 2016-08-15 LAB — RPR

## 2016-08-18 ENCOUNTER — Encounter (HOSPITAL_COMMUNITY): Payer: Self-pay

## 2016-08-18 ENCOUNTER — Ambulatory Visit (HOSPITAL_COMMUNITY)
Admission: RE | Admit: 2016-08-18 | Discharge: 2016-08-18 | Disposition: A | Discharge status: Home / Self Care | Payer: BLUE CROSS/BLUE SHIELD | Source: Ambulatory Visit | Attending: Advanced Practice Midwife | Admitting: Advanced Practice Midwife

## 2016-08-18 DIAGNOSIS — O285 Abnormal chromosomal and genetic finding on antenatal screening of mother: Secondary | ICD-10-CM | POA: Insufficient documentation

## 2016-08-18 DIAGNOSIS — O09523 Supervision of elderly multigravida, third trimester: Secondary | ICD-10-CM | POA: Diagnosis not present

## 2016-08-18 DIAGNOSIS — Z3A29 29 weeks gestation of pregnancy: Secondary | ICD-10-CM | POA: Diagnosis not present

## 2016-08-18 DIAGNOSIS — O99213 Obesity complicating pregnancy, third trimester: Secondary | ICD-10-CM | POA: Insufficient documentation

## 2016-08-18 LAB — GLUCOSE TOLERANCE, 1 HOUR (50G) W/O FASTING: Glucose, 1 Hr, gestational: 124 mg/dL (ref <140)

## 2016-08-19 ENCOUNTER — Other Ambulatory Visit (HOSPITAL_COMMUNITY): Payer: Self-pay | Admitting: *Deleted

## 2016-08-19 DIAGNOSIS — O285 Abnormal chromosomal and genetic finding on antenatal screening of mother: Secondary | ICD-10-CM

## 2016-08-27 ENCOUNTER — Encounter (HOSPITAL_COMMUNITY): Payer: Self-pay | Admitting: *Deleted

## 2016-08-27 ENCOUNTER — Inpatient Hospital Stay (HOSPITAL_COMMUNITY)
Admission: AD | Admit: 2016-08-27 | Discharge: 2016-08-27 | Disposition: A | Discharge status: Home / Self Care | Payer: BLUE CROSS/BLUE SHIELD | Source: Ambulatory Visit | Attending: Obstetrics & Gynecology | Admitting: Obstetrics & Gynecology

## 2016-08-27 ENCOUNTER — Telehealth: Payer: Self-pay

## 2016-08-27 DIAGNOSIS — O26892 Other specified pregnancy related conditions, second trimester: Secondary | ICD-10-CM | POA: Diagnosis not present

## 2016-08-27 DIAGNOSIS — Q909 Down syndrome, unspecified: Secondary | ICD-10-CM | POA: Insufficient documentation

## 2016-08-27 DIAGNOSIS — O26893 Other specified pregnancy related conditions, third trimester: Secondary | ICD-10-CM | POA: Insufficient documentation

## 2016-08-27 DIAGNOSIS — O351XX0 Maternal care for (suspected) chromosomal abnormality in fetus, not applicable or unspecified: Secondary | ICD-10-CM

## 2016-08-27 DIAGNOSIS — R109 Unspecified abdominal pain: Secondary | ICD-10-CM | POA: Diagnosis not present

## 2016-08-27 DIAGNOSIS — Z79899 Other long term (current) drug therapy: Secondary | ICD-10-CM | POA: Diagnosis not present

## 2016-08-27 DIAGNOSIS — O3510X0 Maternal care for (suspected) chromosomal abnormality in fetus, unspecified, not applicable or unspecified: Secondary | ICD-10-CM

## 2016-08-27 DIAGNOSIS — Z808 Family history of malignant neoplasm of other organs or systems: Secondary | ICD-10-CM | POA: Diagnosis not present

## 2016-08-27 DIAGNOSIS — Z888 Allergy status to other drugs, medicaments and biological substances status: Secondary | ICD-10-CM | POA: Diagnosis not present

## 2016-08-27 DIAGNOSIS — O09523 Supervision of elderly multigravida, third trimester: Secondary | ICD-10-CM | POA: Insufficient documentation

## 2016-08-27 DIAGNOSIS — Z9889 Other specified postprocedural states: Secondary | ICD-10-CM | POA: Diagnosis not present

## 2016-08-27 DIAGNOSIS — Z7982 Long term (current) use of aspirin: Secondary | ICD-10-CM | POA: Diagnosis not present

## 2016-08-27 DIAGNOSIS — Z3A30 30 weeks gestation of pregnancy: Secondary | ICD-10-CM | POA: Insufficient documentation

## 2016-08-27 DIAGNOSIS — Z8249 Family history of ischemic heart disease and other diseases of the circulatory system: Secondary | ICD-10-CM | POA: Insufficient documentation

## 2016-08-27 LAB — URINALYSIS, ROUTINE W REFLEX MICROSCOPIC
Bilirubin Urine: NEGATIVE
Glucose, UA: NEGATIVE mg/dL
Hgb urine dipstick: NEGATIVE
Ketones, ur: NEGATIVE mg/dL
Leukocytes, UA: NEGATIVE
Nitrite: NEGATIVE
Protein, ur: NEGATIVE mg/dL
Specific Gravity, Urine: 1.021 (ref 1.005–1.030)
pH: 6 (ref 5.0–8.0)

## 2016-08-27 LAB — FETAL FIBRONECTIN: Fetal Fibronectin: NEGATIVE

## 2016-08-27 NOTE — MAU Note (Signed)
Pt stated she thinks she over did it yesterday. She sis  A lot of walking and when they got home last night she started having cramping and pressure. Still feeling that way today, Also reports baby has not moved as much today. Denies any vag bleeding or discharge.

## 2016-08-27 NOTE — Telephone Encounter (Signed)
Pt is pregnant. She called and stated that she did a lot of walking yesterday was experiencing stomach cramping and wanted to be seen by a doctor. I called Kenney Housemananya B[place]on to see what I should do about scheduling her because at time of call we did not have a doctor in the office. Kenney Housemananya had me speak with [facility] and Verdie Mosherneetra told me to [facility]. I told the pt and she said she would go to hospital.

## 2016-08-27 NOTE — MAU Provider Note (Signed)
MAU PROVIDER NOTE  Chief Complaint:  Abdominal Cramping  HPI: Courtney Costa is a 40 y.o. G3P2002 at 581w2d who presents to maternity admissions reporting abdominal cramping and pain.  It began early this morning around 7:30 AM when she was going to the bathroom.  Did not take anything for the pain.  Describes it as 7/10.   She went to work and felt worse so came to MAU.  Cramping feels like contractions, she states they are every 5 minutes.  The pain is constant above her belly button and feels tight.  Denies urinary symptoms.  Denies fevers, chills, nausea, vomiting, diarrhea. Has a bit of a cold right now but no other complaints.   Denies contractions, leakage of fluid or vaginal bleeding. Good fetal movement.    Pregnancy Course: advanced maternal age , Trisomy 5021  Past Medical History: Past Medical History:  Diagnosis Date  . Spondylisthesis 05/16/2012  . Spondylisthesis 05/16/2012   Past obstetric history: OB History  Gravida Para Term Preterm AB Living  3 2 2     2   SAB TAB Ectopic Multiple Live Births               # Outcome Date GA Lbr Len/2nd Weight Sex Delivery Anes PTL Lv  3 Current           2 Term 12/29/06   8 lb 4 oz (3.742 kg) M Vag-Spont     1 Term    8 lb 4 oz (3.742 kg) M Vag-Spont        Past Surgical History: Past Surgical History:  Procedure Laterality Date  . BREAST REDUCTION SURGERY  08/2014   Family History: Family History  Problem Relation Age of Onset  . Hypertension Father   . Cirrhosis Father   . Skin cancer Other    Social History: Social History  Substance Use Topics  . Smoking status: Never Smoker  . Smokeless tobacco: Never Used  . Alcohol use No   Allergies:  Allergies  Allergen Reactions  . Vagisil [Benzocaine-Resorcinol] Swelling   Meds:  Prescriptions Prior to Admission  Medication Sig Dispense Refill Last Dose  . acetaminophen (TYLENOL) 500 MG tablet Take 1,000 mg by mouth every 6 (six) hours as needed for headache.    08/26/2016 at Unknown time  . aspirin EC 81 MG tablet Take 1 tablet (81 mg total) by mouth daily. 30 tablet 7 08/26/2016 at Unknown time  . calcium carbonate (TUMS - DOSED IN MG ELEMENTAL CALCIUM) 500 MG chewable tablet Chew 1 tablet by mouth 3 (three) times daily as needed for indigestion or heartburn.   Past Week at Unknown time  . cetirizine (ZYRTEC) 10 MG tablet Take 1 tablet (10 mg total) by mouth daily. (Patient taking differently: Take 10 mg by mouth daily as needed for allergies. ) 30 tablet 6 Past Month at Unknown time  . Prenatal Vit-Fe Fumarate-FA (MULTIVITAMIN-PRENATAL) 27-0.8 MG TABS tablet Take 1 tablet by mouth daily at 12 noon. 30 each 6 Past Week at Unknown time  . ranitidine (ZANTAC) 300 MG tablet Take 1 tablet (300 mg total) by mouth 2 (two) times daily. (Patient not taking: Reported on 08/27/2016) 180 tablet 3 Not Taking at Unknown time   I have reviewed patient's Past Medical Hx, Surgical Hx, Family Hx, Social Hx, medications and allergies.   ROS:  A comprehensive ROS was negative except per HPI.   Physical Exam   Patient Vitals for the past 24 hrs:  BP Temp Pulse  Resp Height Weight  08/27/16 1505 135/83 - 81 18 - -  08/27/16 1250 113/71 98.2 F (36.8 C) 78 18 5\' 2"  (1.575 m) 194 lb (88 kg)   Constitutional: Well-developed, well-nourished female in no acute distress.  Cardiovascular: normal rate Respiratory: normal effort GI: Abd soft, non-tender, gravid appropriate for gestational age. Pos BS x 4  MS: Extremities nontender, no edema, normal ROM Neurologic: Alert and oriented x 4.  GU: Neg CVAT. Pelvic: NEFG, physiologic discharge, no blood, cervix clean. No CMT.  Cervix 0.5 (fingertip), thick and posterior.    FHT:  Baseline 130s , moderate variability, accelerations present, no decelerations Contractions: None on monitor   Labs: Results for orders placed or performed during the hospital encounter of 08/27/16 (from the past 24 hour(s))  Urinalysis, Routine w  reflex microscopic     Status: None   Collection Time: 08/27/16  1:41 PM  Result Value Ref Range   Color, Urine YELLOW YELLOW   APPearance CLEAR CLEAR   Specific Gravity, Urine 1.021 1.005 - 1.030   pH 6.0 5.0 - 8.0   Glucose, UA NEGATIVE NEGATIVE mg/dL   Hgb urine dipstick NEGATIVE NEGATIVE   Bilirubin Urine NEGATIVE NEGATIVE   Ketones, ur NEGATIVE NEGATIVE mg/dL   Protein, ur NEGATIVE NEGATIVE mg/dL   Nitrite NEGATIVE NEGATIVE   Leukocytes, UA NEGATIVE NEGATIVE  Fetal fibronectin     Status: None   Collection Time: 08/27/16  4:08 PM  Result Value Ref Range   Fetal Fibronectin NEGATIVE NEGATIVE   Imaging:  [facility] Ob Follow Up  Result Date: 08/18/2016 OBSTETRICAL ULTRASOUND: This exam was performed within a [facility]. The OB US report was generated in the AS system, and faxed to the ordering physician.  This report is available in the [facility] PACS. See the [facility] Obstetric US report via the Image Link.  MAU Course: UA - negative  FFN - NEG SVE  MDM: Plan of care reviewed with patient, including labs and tests ordered and medical treatment.   Assessment & Plan:  1. Abdominal pain during pregnancy in second trimester   2. 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  Without urinary symptoms and UA negative, FFN negative.  Cervical check - Cervix 0.5 cm (fingertip), posterior and thick.  FHR reassuring and Cat I.  No contractions on monitor.   Discharge home in stable condition.   Labor precautions and fetal kick counts reviewed.  Strict return precautions discussed .   Allergies as of 08/27/2016      Reactions   Vagisil [benzocaine-resorcinol] Swelling      Medication List    TAKE these medications   acetaminophen 500 MG tablet Commonly known as:  TYLENOL Take 1,000 mg by mouth every 6 (six) hours as  needed for headache.   aspirin EC 81 MG tablet Take 1 tablet (81 mg total) by mouth daily.   calcium carbonate 500 MG chewable tablet Commonly known as:  TUMS - dosed in mg elemental calcium Chew 1 tablet by mouth 3 (three) times daily as needed for indigestion or heartburn.   cetirizine 10 MG tablet Commonly known as:  ZYRTEC Take 1 tablet (10 mg total) by mouth daily. What changed:  when to take this  reasons to take this   multivitamin-prenatal 27-0.8 MG Tabs tablet Take 1 tablet by mouth daily at 12 noon.  ranitidine 300 MG tablet Commonly known as:  ZANTAC Take 1 tablet (300 mg total) by mouth 2 (two) times daily.       Freddrick March, MD PGY-1 08/27/2016 5:06 PM   OB FELLOW MAU DISCHARGE ATTESTATION  I have seen and examined this patient; I agree with above documentation in the resident's note.   I personally reviewed the patient's NST today, found to be REACTIVE. 135 bpm, mod var, +accels, no decels. CTX: None.  Jen Mow, DO OB Fellow 6:17 PM

## 2016-08-27 NOTE — Discharge Instructions (Signed)
Abdominal Pain During Pregnancy °Belly (abdominal) pain is common during pregnancy. Most of the time, it is not a serious problem. Other times, it can be a sign that something is wrong with the pregnancy. Always tell your doctor if you have belly pain. °Follow these instructions at home: °Monitor your belly pain for any changes. The following actions may help you feel better: °· Do not have sex (intercourse) or put anything in your vagina until you feel better. °· Rest until your pain stops. °· Drink clear fluids if you feel sick to your stomach (nauseous). Do not eat solid food until you feel better. °· Only take medicine as told by your doctor. °· Keep all doctor visits as told. °Get help right away if: °· You are bleeding, leaking fluid, or pieces of tissue come out of your vagina. °· You have more pain or cramping. °· You keep throwing up (vomiting). °· You have pain when you pee (urinate) or have blood in your pee. °· You have a fever. °· You do not feel your baby moving as much. °· You feel very weak or feel like passing out. °· You have trouble breathing, with or without belly pain. °· You have a very bad headache and belly pain. °· You have fluid leaking from your vagina and belly pain. °· You keep having watery poop (diarrhea). °· Your belly pain does not go away after resting, or the pain gets worse. °This information is not intended to replace advice given to you by your health care provider. Make sure you discuss any questions you have with your health care provider. °Document Released: 08/12/2009 Document Revised: 04/01/2016 Document Reviewed: 03/23/2013 °Elsevier Interactive Patient Education © 2017 Elsevier Inc. ° °

## 2016-08-28 ENCOUNTER — Ambulatory Visit (INDEPENDENT_AMBULATORY_CARE_PROVIDER_SITE_OTHER): Payer: BLUE CROSS/BLUE SHIELD | Admitting: Family

## 2016-08-28 VITALS — BP 111/69 | HR 88 | Wt 189.0 lb

## 2016-08-28 DIAGNOSIS — Z3493 Encounter for supervision of normal pregnancy, unspecified, third trimester: Secondary | ICD-10-CM

## 2016-08-28 NOTE — Progress Notes (Signed)
   PRENATAL VISIT NOTE  Subjective:  Courtney CanterburyLaura Elizabeth Costa is a 40 y.o. G3P2002 at 2179w3d being seen today for ongoing prenatal care.  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  Patient reports being seen in MAU yesterday for upper abdominal pain.  Cervix closed.  Pain described as "spasms", then resolved.   Contractions: Not present. Vag. Bleeding: None.  Movement: Present. Denies leaking of fluid.   The following portions of the patient's history were reviewed and updated as appropriate: allergies, current medications, past family history, past medical history, past social history, past surgical history and problem list. Problem list updated.  Objective:   Vitals:   08/28/16 1017  BP: 111/69  Pulse: 88  Weight: 189 lb (85.7 kg)    Fetal Status: Fetal Heart Rate (bpm): 138 Fundal Height: 30 cm Movement: Present     General:  Alert, oriented and cooperative. Patient is in no acute distress.  Skin: Skin is warm and dry. No rash noted.   Cardiovascular: Normal heart rate noted  Respiratory: Normal respiratory effort, no problems with respiration noted  Abdomen: Soft, gravid, appropriate for gestational age. Pain/Pressure: Present     Pelvic:  Cervical exam deferred        Extremities: Normal range of motion.  Edema: Trace  Mental Status: Normal mood and affect. Normal behavior. Normal judgment and thought content.   Assessment and Plan:  Pregnancy: G3P2002 at 4679w3d  1.  Supervision of High Risk Pregnancy - Reviewed third trimester labs  2.  Upper abdominal pain - Like gallbladder, will do  further testing if pain returns  Preterm labor symptoms and general obstetric precautions including but not limited to vaginal bleeding, contractions, leaking of fluid and fetal movement were reviewed in detail with the patient. Please refer to After Visit Summary for other counseling recommendations.  Return in about 2 weeks (around 09/11/2016).   Eino FarberWalidah Kennith GainN Karim, CNM

## 2016-09-07 NOTE — L&D Delivery Note (Signed)
Patient is 41 y.o. [id number] [redacted]w[redacted]d admitted for IOL for possible abruption.    Delivery Note At 10:35 PM a viable female was delivered via Vaginal, Spontaneous Delivery (Presentation: Occiput anterior) en caul.  APGAR: 9, 9; weight pending.    Placenta status: delivered intact with gentle traction.  Cord: 3 vessel  with the following complications: none.    Anesthesia:  Epidural  Lacerations: None Est. Blood Loss (mL): 300  Mom to postpartum.  Baby to Couplet care / Skin to Skin.  De HollingsheadCatherine L Costa 10/30/2016, 11:17 PM  Patient is a [id number] at 183w3d who was admitted for IOL for possible abruption, significant hx of infant w/ Trisomy 21.  She progressed with augmentation via cytotec.  I was gloved and present for delivery in its entirety.  Second stage of labor progressed, baby delivered en caul after a few contractions.  Mild decels during second stage noted.  Complications: none  Lacerations: none  EBL: 300cc  Cam HaiSHAW, Courtney Costa, CNM 11:29 PM  10/30/2016

## 2016-09-11 ENCOUNTER — Ambulatory Visit (INDEPENDENT_AMBULATORY_CARE_PROVIDER_SITE_OTHER): Payer: BLUE CROSS/BLUE SHIELD | Admitting: Family

## 2016-09-11 VITALS — BP 93/60 | HR 81 | Wt 193.0 lb

## 2016-09-11 DIAGNOSIS — O351XX0 Maternal care for (suspected) chromosomal abnormality in fetus, not applicable or unspecified: Secondary | ICD-10-CM

## 2016-09-11 DIAGNOSIS — O0990 Supervision of high risk pregnancy, unspecified, unspecified trimester: Secondary | ICD-10-CM

## 2016-09-11 DIAGNOSIS — O3510X0 Maternal care for (suspected) chromosomal abnormality in fetus, unspecified, not applicable or unspecified: Secondary | ICD-10-CM

## 2016-09-11 DIAGNOSIS — O09513 Supervision of elderly primigravida, third trimester: Secondary | ICD-10-CM | POA: Insufficient documentation

## 2016-09-11 NOTE — Progress Notes (Signed)
   PRENATAL VISIT NOTE  Subjective:  Courtney Costa is a 41 y.o. G3P2002 at 4371w3d being seen today for ongoing prenatal care.  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  Patient reports no complaints.  No additional abdominal pain incidents.    Contractions: Not present. Vag. Bleeding: None.  Movement: Present. Denies leaking of fluid.   The following portions of the patient's history were reviewed and updated as appropriate: allergies, current medications, past family history, past medical history, past social history, past surgical history and problem list. Problem list updated.  Objective:   Vitals:   09/11/16 1021  BP: 93/60  Pulse: 81  Weight: 193 lb (87.5 kg)    Fetal Status: Fetal Heart Rate (bpm): 134 Fundal Height: 32 cm Movement: Present     General:  Alert, oriented and cooperative. Patient is in no acute distress.  Skin: Skin is warm and dry. No rash noted.   Cardiovascular: Normal heart rate noted  Respiratory: Normal respiratory effort, no problems with respiration noted  Abdomen: Soft, gravid, appropriate for gestational age. Pain/Pressure: Absent     Pelvic:  Cervical exam deferred        Extremities: Normal range of motion.  Edema: Trace  Mental Status: Normal mood and affect. Normal behavior. Normal judgment and thought content.   Assessment and Plan:  Pregnancy: G3P2002 at 2371w3d  1. Suspected chromosome anomaly of fetus affecting management of mother, antepartum, single or unspecified fetus - Follow-up ultrasound scheduled for next Tuesday  2. Elderly multigravida in  third trimester - Continue lose monitoring  Preterm labor symptoms and general obstetric precautions including but not limited to vaginal bleeding, contractions, leaking of fluid and fetal movement were reviewed in detail with the patient. Please refer to After Visit Summary for other counseling recommendations.  Return in about 2 weeks (around 09/25/2016).   Eino FarberWalidah Kennith GainN Karim, CNM

## 2016-09-15 ENCOUNTER — Ambulatory Visit (HOSPITAL_COMMUNITY)
Admission: RE | Admit: 2016-09-15 | Discharge: 2016-09-15 | Disposition: A | Discharge status: Home / Self Care | Payer: BLUE CROSS/BLUE SHIELD | Source: Ambulatory Visit | Attending: Family | Admitting: Family

## 2016-09-15 ENCOUNTER — Encounter (HOSPITAL_COMMUNITY): Payer: Self-pay

## 2016-09-15 ENCOUNTER — Other Ambulatory Visit (HOSPITAL_COMMUNITY): Payer: Self-pay | Admitting: *Deleted

## 2016-09-15 DIAGNOSIS — O285 Abnormal chromosomal and genetic finding on antenatal screening of mother: Secondary | ICD-10-CM | POA: Diagnosis not present

## 2016-09-15 DIAGNOSIS — O99213 Obesity complicating pregnancy, third trimester: Secondary | ICD-10-CM | POA: Diagnosis not present

## 2016-09-15 DIAGNOSIS — O09523 Supervision of elderly multigravida, third trimester: Secondary | ICD-10-CM | POA: Diagnosis not present

## 2016-09-15 DIAGNOSIS — O283 Abnormal ultrasonic finding on antenatal screening of mother: Secondary | ICD-10-CM | POA: Insufficient documentation

## 2016-09-15 DIAGNOSIS — Z3A33 33 weeks gestation of pregnancy: Secondary | ICD-10-CM | POA: Diagnosis not present

## 2016-09-15 DIAGNOSIS — O289 Unspecified abnormal findings on antenatal screening of mother: Secondary | ICD-10-CM

## 2016-09-25 ENCOUNTER — Ambulatory Visit (INDEPENDENT_AMBULATORY_CARE_PROVIDER_SITE_OTHER): Payer: BLUE CROSS/BLUE SHIELD | Admitting: Family

## 2016-09-25 VITALS — BP 104/67 | HR 99 | Wt 195.0 lb

## 2016-09-25 DIAGNOSIS — O3510X0 Maternal care for (suspected) chromosomal abnormality in fetus, unspecified, not applicable or unspecified: Secondary | ICD-10-CM

## 2016-09-25 DIAGNOSIS — O09513 Supervision of elderly primigravida, third trimester: Secondary | ICD-10-CM

## 2016-09-25 DIAGNOSIS — O0990 Supervision of high risk pregnancy, unspecified, unspecified trimester: Secondary | ICD-10-CM

## 2016-09-25 DIAGNOSIS — O351XX0 Maternal care for (suspected) chromosomal abnormality in fetus, not applicable or unspecified: Secondary | ICD-10-CM

## 2016-09-25 NOTE — Progress Notes (Signed)
   PRENATAL VISIT NOTE  Subjective:  Courtney Costa is a 41 y.o. G3P2002 at 949w3d being seen today for ongoing prenatal care.  She is currently monitored for the following issues for this high-risk pregnancy and has Spondylisthesis; Breast mass, right; Anxiety as acute reaction to exceptional stress; Insomnia; Depression; Obesity; Cough; Supervision of high risk pregnancy, antepartum; S/P bilateral breast reduction; Heartburn during pregnancy; Suspected fetal chromosome anomaly affecting antepartum care of mother; Plantar wart of right foot; and AMA (advanced maternal age) primigravida 35+, third trimester on her problem list.  Patient reports no complaints.  Contractions: Not present. Vag. Bleeding: None.  Movement: Present. Denies leaking of fluid.   The following portions of the patient's history were reviewed and updated as appropriate: allergies, current medications, past family history, past medical history, past social history, past surgical history and problem list. Problem list updated.  Objective:   Vitals:   09/25/16 0838  BP: 104/67  Pulse: 99  Weight: 88.5 kg (195 lb)    Fetal Status: Fetal Heart Rate (bpm): 138 Fundal Height: 34 cm Movement: Present     General:  Alert, oriented and cooperative. Patient is in no acute distress.  Skin: Skin is warm and dry. No rash noted.   Cardiovascular: Normal heart rate noted  Respiratory: Normal respiratory effort, no problems with respiration noted  Abdomen: Soft, gravid, appropriate for gestational age. Pain/Pressure: Absent     Pelvic:  Cervical exam deferred        Extremities: Normal range of motion.  Edema: Trace  Mental Status: Normal mood and affect. Normal behavior. Normal judgment and thought content.   Assessment and Plan:  Pregnancy: G3P2002 at 4649w3d  1. AMA (advanced maternal age) primigravida 35+, third trimester - Reviewed follow-up ultrasound results > tech was wonderful!!!  2. Suspected chromosome  anomaly of fetus affecting management of mother, antepartum, single or unspecified fetus - Borderline ventriculomegaly  3. Supervision of high risk pregnancy, antepartum - GBS and NST at next visit  Preterm labor symptoms and general obstetric precautions including but not limited to vaginal bleeding, contractions, leaking of fluid and fetal movement were reviewed in detail with the patient. Please refer to After Visit Summary for other counseling recommendations.  Return in about 2 weeks (around 10/09/2016) for appt and NST.   Courtney Costa, CNM

## 2016-10-13 ENCOUNTER — Ambulatory Visit (HOSPITAL_COMMUNITY)
Admission: RE | Admit: 2016-10-13 | Discharge: 2016-10-13 | Disposition: A | Discharge status: Home / Self Care | Payer: BLUE CROSS/BLUE SHIELD | Source: Ambulatory Visit | Attending: Family | Admitting: Family

## 2016-10-13 ENCOUNTER — Encounter (HOSPITAL_COMMUNITY): Payer: Self-pay

## 2016-10-13 DIAGNOSIS — O285 Abnormal chromosomal and genetic finding on antenatal screening of mother: Secondary | ICD-10-CM | POA: Diagnosis not present

## 2016-10-13 DIAGNOSIS — O99213 Obesity complicating pregnancy, third trimester: Secondary | ICD-10-CM | POA: Insufficient documentation

## 2016-10-13 DIAGNOSIS — O09523 Supervision of elderly multigravida, third trimester: Secondary | ICD-10-CM | POA: Insufficient documentation

## 2016-10-13 DIAGNOSIS — Z3A37 37 weeks gestation of pregnancy: Secondary | ICD-10-CM | POA: Diagnosis not present

## 2016-10-13 DIAGNOSIS — O289 Unspecified abnormal findings on antenatal screening of mother: Secondary | ICD-10-CM

## 2016-10-14 ENCOUNTER — Ambulatory Visit (INDEPENDENT_AMBULATORY_CARE_PROVIDER_SITE_OTHER): Payer: BLUE CROSS/BLUE SHIELD | Admitting: Obstetrics & Gynecology

## 2016-10-14 VITALS — BP 112/66 | HR 82 | Wt 194.0 lb

## 2016-10-14 DIAGNOSIS — O09523 Supervision of elderly multigravida, third trimester: Secondary | ICD-10-CM

## 2016-10-14 DIAGNOSIS — Z113 Encounter for screening for infections with a predominantly sexual mode of transmission: Secondary | ICD-10-CM | POA: Diagnosis not present

## 2016-10-14 LAB — OB RESULTS CONSOLE GC/CHLAMYDIA: Gonorrhea: NEGATIVE

## 2016-10-14 LAB — OB RESULTS CONSOLE GBS: GBS: NEGATIVE

## 2016-10-14 NOTE — Progress Notes (Signed)
   PRENATAL VISIT NOTE  Subjective:  Courtney Costa is a 41 y.o. G3P2002 at 7540w1d being seen today for ongoing prenatal care.  She is currently monitored for the following issues for this high-risk pregnancy and has Spondylisthesis; Breast mass, right; Anxiety as acute reaction to exceptional stress; Insomnia; Depression; Obesity; Cough; Supervision of high risk pregnancy, antepartum; S/P bilateral breast reduction; Heartburn during pregnancy; Suspected fetal chromosome anomaly affecting antepartum care of mother; Plantar wart of right foot; and AMA (advanced maternal age) primigravida 35+, third trimester on her problem list.  Patient reports no complaints.  Contractions: Not present. Vag. Bleeding: None.  Movement: Present. Denies leaking of fluid.   The following portions of the patient's history were reviewed and updated as appropriate: allergies, current medications, past family history, past medical history, past social history, past surgical history and problem list. Problem list updated.  Objective:   Vitals:   10/14/16 0858  BP: 112/66  Pulse: 82  Weight: 194 lb (88 kg)    Fetal Status: Fetal Heart Rate (bpm): 138   Movement: Present     General:  Alert, oriented and cooperative. Patient is in no acute distress.  Skin: Skin is warm and dry. No rash noted.   Cardiovascular: Normal heart rate noted  Respiratory: Normal respiratory effort, no problems with respiration noted  Abdomen: Soft, gravid, appropriate for gestational age. Pain/Pressure: Absent     Pelvic:  Cervical exam performed      1/50/bal  Extremities: Normal range of motion.  Edema: Trace  Mental Status: Normal mood and affect. Normal behavior. Normal judgment and thought content.   Assessment and Plan:  Pregnancy: G3P2002 at 7640w1d  1. Advanced maternal age in multigravida, third trimester - twice weekly testing and induction at 40 weeks. - Urine cytology ancillary only - Culture, beta strep (group b  only)  Term labor symptoms and general obstetric precautions including but not limited to vaginal bleeding, contractions, leaking of fluid and fetal movement were reviewed in detail with the patient. Please refer to After Visit Summary for other counseling recommendations.  Return in about 5 days (around 10/19/2016).   Lesly DukesKelly H Leggett, MD

## 2016-10-15 LAB — URINE CYTOLOGY ANCILLARY ONLY
Chlamydia: NEGATIVE
Neisseria Gonorrhea: NEGATIVE

## 2016-10-17 LAB — CULTURE, BETA STREP (GROUP B ONLY)

## 2016-10-19 ENCOUNTER — Ambulatory Visit (INDEPENDENT_AMBULATORY_CARE_PROVIDER_SITE_OTHER): Payer: BLUE CROSS/BLUE SHIELD | Admitting: *Deleted

## 2016-10-19 ENCOUNTER — Encounter: Payer: Self-pay | Admitting: *Deleted

## 2016-10-19 VITALS — BP 111/58 | HR 84 | Wt 194.0 lb

## 2016-10-19 DIAGNOSIS — O09523 Supervision of elderly multigravida, third trimester: Secondary | ICD-10-CM | POA: Diagnosis not present

## 2016-10-19 LAB — FETAL NONSTRESS TEST

## 2016-10-20 ENCOUNTER — Encounter: Payer: Self-pay | Admitting: Family Medicine

## 2016-10-22 ENCOUNTER — Ambulatory Visit (INDEPENDENT_AMBULATORY_CARE_PROVIDER_SITE_OTHER): Payer: BLUE CROSS/BLUE SHIELD | Admitting: Obstetrics & Gynecology

## 2016-10-22 VITALS — BP 103/59 | HR 90 | Wt 196.0 lb

## 2016-10-22 DIAGNOSIS — O3510X0 Maternal care for (suspected) chromosomal abnormality in fetus, unspecified, not applicable or unspecified: Secondary | ICD-10-CM

## 2016-10-22 DIAGNOSIS — Z3689 Encounter for other specified antenatal screening: Secondary | ICD-10-CM

## 2016-10-22 DIAGNOSIS — Z6836 Body mass index (BMI) 36.0-36.9, adult: Secondary | ICD-10-CM

## 2016-10-22 DIAGNOSIS — O351XX0 Maternal care for (suspected) chromosomal abnormality in fetus, not applicable or unspecified: Secondary | ICD-10-CM

## 2016-10-22 DIAGNOSIS — O09513 Supervision of elderly primigravida, third trimester: Secondary | ICD-10-CM

## 2016-10-22 DIAGNOSIS — O0990 Supervision of high risk pregnancy, unspecified, unspecified trimester: Secondary | ICD-10-CM

## 2016-10-22 DIAGNOSIS — E6609 Other obesity due to excess calories: Secondary | ICD-10-CM

## 2016-10-22 NOTE — Progress Notes (Signed)
   PRENATAL VISIT NOTE  Subjective:  Janece CanterburyLaura Elizabeth Ellers is a 41 y.o. G3P2002 at 3045w2d being seen today for ongoing prenatal care.  She is currently monitored for the following issues for this high-risk pregnancy and has Spondylisthesis; Breast mass, right; Anxiety as acute reaction to exceptional stress; Insomnia; Depression; Obesity; Cough; Supervision of high risk pregnancy, antepartum; S/P bilateral breast reduction; Heartburn during pregnancy; Suspected fetal chromosome anomaly affecting antepartum care of mother; Plantar wart of right foot; and AMA (advanced maternal age) primigravida 35+, third trimester on her problem list.  Patient reports no complaints.  Contractions: Irritability. Vag. Bleeding: None.  Movement: Present. Denies leaking of fluid.   The following portions of the patient's history were reviewed and updated as appropriate: allergies, current medications, past family history, past medical history, past social history, past surgical history and problem list. Problem list updated.  Objective:   Vitals:   10/22/16 1420  BP: (!) 103/59  Pulse: 90  Weight: 196 lb (88.9 kg)    Fetal Status:     Movement: Present  Presentation: Vertex  General:  Alert, oriented and cooperative. Patient is in no acute distress.  Skin: Skin is warm and dry. No rash noted.   Cardiovascular: Normal heart rate noted  Respiratory: Normal respiratory effort, no problems with respiration noted  Abdomen: Soft, gravid, appropriate for gestational age. Pain/Pressure: Present     Pelvic:  Cervical exam performed        Extremities: Normal range of motion.  Edema: Mild pitting, slight indentation  Mental Status: Normal mood and affect. Normal behavior. Normal judgment and thought content.   Assessment and Plan:  Pregnancy: G3P2002 at 6645w2d  1. AMA (advanced maternal age) primigravida 35+, third trimester - IOL next week  2. Class 2 obesity due to excess calories without serious comorbidity  with body mass index (BMI) of 36.0 to 36.9 in adult   3. 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 Supervision of high risk pregnancy, antepartum   Preterm labor symptoms and general obstetric precautions including but not limited to vaginal bleeding, contractions, leaking of fluid and fetal movement were reviewed in detail with the patient. Please refer to After Visit Summary for other counseling recommendations.  No Follow-up on file.   Allie BossierMyra C Keanan Melander, MD

## 2016-10-23 ENCOUNTER — Encounter: Payer: Self-pay | Admitting: Family Medicine

## 2016-10-23 ENCOUNTER — Encounter (HOSPITAL_COMMUNITY): Payer: Self-pay | Admitting: *Deleted

## 2016-10-23 ENCOUNTER — Telehealth (HOSPITAL_COMMUNITY): Payer: Self-pay | Admitting: *Deleted

## 2016-10-23 NOTE — Telephone Encounter (Signed)
Preadmission screen  

## 2016-10-26 ENCOUNTER — Other Ambulatory Visit: Payer: BLUE CROSS/BLUE SHIELD

## 2016-10-27 ENCOUNTER — Ambulatory Visit (INDEPENDENT_AMBULATORY_CARE_PROVIDER_SITE_OTHER): Payer: BLUE CROSS/BLUE SHIELD | Admitting: *Deleted

## 2016-10-27 VITALS — BP 113/67 | HR 86 | Wt 196.0 lb

## 2016-10-27 DIAGNOSIS — O09513 Supervision of elderly primigravida, third trimester: Secondary | ICD-10-CM

## 2016-10-30 ENCOUNTER — Ambulatory Visit (INDEPENDENT_AMBULATORY_CARE_PROVIDER_SITE_OTHER): Payer: BLUE CROSS/BLUE SHIELD | Admitting: Advanced Practice Midwife

## 2016-10-30 ENCOUNTER — Inpatient Hospital Stay (HOSPITAL_COMMUNITY): Payer: BLUE CROSS/BLUE SHIELD | Admitting: Anesthesiology

## 2016-10-30 ENCOUNTER — Encounter (HOSPITAL_COMMUNITY): Payer: Self-pay

## 2016-10-30 ENCOUNTER — Inpatient Hospital Stay (HOSPITAL_COMMUNITY)
Admission: AD | Admit: 2016-10-30 | Discharge: 2016-11-01 | DRG: 774 | Disposition: A | Discharge status: Home / Self Care | Payer: BLUE CROSS/BLUE SHIELD | Source: Ambulatory Visit | Attending: Obstetrics and Gynecology | Admitting: Obstetrics and Gynecology

## 2016-10-30 VITALS — BP 114/67 | HR 93 | Wt 195.0 lb

## 2016-10-30 DIAGNOSIS — Z3A39 39 weeks gestation of pregnancy: Secondary | ICD-10-CM

## 2016-10-30 DIAGNOSIS — O09513 Supervision of elderly primigravida, third trimester: Secondary | ICD-10-CM | POA: Diagnosis not present

## 2016-10-30 DIAGNOSIS — O4593 Premature separation of placenta, unspecified, third trimester: Principal | ICD-10-CM | POA: Diagnosis present

## 2016-10-30 DIAGNOSIS — O351XX0 Maternal care for (suspected) chromosomal abnormality in fetus, not applicable or unspecified: Secondary | ICD-10-CM | POA: Diagnosis not present

## 2016-10-30 DIAGNOSIS — Z8249 Family history of ischemic heart disease and other diseases of the circulatory system: Secondary | ICD-10-CM | POA: Diagnosis not present

## 2016-10-30 DIAGNOSIS — O3510X0 Maternal care for (suspected) chromosomal abnormality in fetus, unspecified, not applicable or unspecified: Secondary | ICD-10-CM

## 2016-10-30 DIAGNOSIS — O4590 Premature separation of placenta, unspecified, unspecified trimester: Secondary | ICD-10-CM | POA: Diagnosis present

## 2016-10-30 DIAGNOSIS — O0990 Supervision of high risk pregnancy, unspecified, unspecified trimester: Secondary | ICD-10-CM

## 2016-10-30 DIAGNOSIS — Z3493 Encounter for supervision of normal pregnancy, unspecified, third trimester: Secondary | ICD-10-CM | POA: Diagnosis not present

## 2016-10-30 DIAGNOSIS — Z3689 Encounter for other specified antenatal screening: Secondary | ICD-10-CM

## 2016-10-30 LAB — CBC
HCT: 37.3 % (ref 36.0–46.0)
Hemoglobin: 13 g/dL (ref 12.0–15.0)
MCH: 31.3 pg (ref 26.0–34.0)
MCHC: 34.9 g/dL (ref 30.0–36.0)
MCV: 89.9 fL (ref 78.0–100.0)
Platelets: 236 10*3/uL (ref 150–400)
RBC: 4.15 MIL/uL (ref 3.87–5.11)
RDW: 13.9 % (ref 11.5–15.5)
WBC: 10.8 10*3/uL — ABNORMAL HIGH (ref 4.0–10.5)

## 2016-10-30 LAB — TYPE AND SCREEN
ABO/RH(D): B POS
Antibody Screen: NEGATIVE

## 2016-10-30 LAB — ABO/RH: ABO/RH(D): B POS

## 2016-10-30 MED ORDER — PHENYLEPHRINE 40 MCG/ML (10ML) SYRINGE FOR IV PUSH (FOR BLOOD PRESSURE SUPPORT)
80.0000 ug | PREFILLED_SYRINGE | INTRAVENOUS | Status: DC | PRN
  Filled 2016-10-30: qty 5

## 2016-10-30 MED ORDER — OXYTOCIN BOLUS FROM INFUSION: 500.0000 mL | Freq: Once | INTRAVENOUS | Status: DC

## 2016-10-30 MED ORDER — EPHEDRINE 5 MG/ML INJ
10.0000 mg | INTRAVENOUS | Status: DC | PRN
  Filled 2016-10-30: qty 4

## 2016-10-30 MED ORDER — PHENYLEPHRINE 40 MCG/ML (10ML) SYRINGE FOR IV PUSH (FOR BLOOD PRESSURE SUPPORT)
PREFILLED_SYRINGE | INTRAVENOUS | Status: AC
  Filled 2016-10-30: qty 20

## 2016-10-30 MED ORDER — OXYTOCIN 40 UNITS IN LACTATED RINGERS INFUSION - SIMPLE MED
2.5000 [IU]/h | INTRAVENOUS | Status: DC
  Filled 2016-10-30: qty 1000

## 2016-10-30 MED ORDER — MISOPROSTOL 25 MCG QUARTER TABLET
25.0000 ug | ORAL_TABLET | Freq: Once | ORAL | Status: AC
  Administered 2016-10-30: 25 ug via ORAL
  Filled 2016-10-30: qty 0.25

## 2016-10-30 MED ORDER — OXYCODONE-ACETAMINOPHEN 5-325 MG PO TABS: 2.0000 | ORAL_TABLET | ORAL | Status: DC | PRN

## 2016-10-30 MED ORDER — MISOPROSTOL 25 MCG QUARTER TABLET
25.0000 ug | ORAL_TABLET | ORAL | Status: DC | PRN
  Filled 2016-10-30: qty 0.25

## 2016-10-30 MED ORDER — OXYCODONE-ACETAMINOPHEN 5-325 MG PO TABS: 1.0000 | ORAL_TABLET | ORAL | Status: DC | PRN

## 2016-10-30 MED ORDER — LIDOCAINE HCL (PF) 1 % IJ SOLN
INTRAMUSCULAR | Status: DC | PRN
  Administered 2016-10-30 (×2): 6 mL via EPIDURAL

## 2016-10-30 MED ORDER — LACTATED RINGERS IV SOLN
INTRAVENOUS | Status: DC
  Administered 2016-10-30: 20:00:00 via INTRAVENOUS

## 2016-10-30 MED ORDER — MISOPROSTOL 200 MCG PO TABS
50.0000 ug | ORAL_TABLET | ORAL | Status: DC | PRN
  Administered 2016-10-30: 50 ug via ORAL
  Administered 2016-10-30: 25 ug via ORAL
  Filled 2016-10-30: qty 0.5

## 2016-10-30 MED ORDER — ACETAMINOPHEN 325 MG PO TABS: 650.0000 mg | ORAL_TABLET | ORAL | Status: DC | PRN

## 2016-10-30 MED ORDER — LACTATED RINGERS IV SOLN: 500.0000 mL | INTRAVENOUS | Status: DC | PRN

## 2016-10-30 MED ORDER — SOD CITRATE-CITRIC ACID 500-334 MG/5ML PO SOLN: 30.0000 mL | ORAL | Status: DC | PRN

## 2016-10-30 MED ORDER — FENTANYL CITRATE (PF) 100 MCG/2ML IJ SOLN
100.0000 ug | INTRAMUSCULAR | Status: DC | PRN
  Administered 2016-10-30: 100 ug via INTRAVENOUS
  Filled 2016-10-30: qty 2

## 2016-10-30 MED ORDER — LIDOCAINE HCL (PF) 1 % IJ SOLN
30.0000 mL | INTRAMUSCULAR | Status: DC | PRN
  Filled 2016-10-30: qty 30

## 2016-10-30 MED ORDER — DIPHENHYDRAMINE HCL 50 MG/ML IJ SOLN: 12.5000 mg | INTRAMUSCULAR | Status: DC | PRN

## 2016-10-30 MED ORDER — TERBUTALINE SULFATE 1 MG/ML IJ SOLN
0.2500 mg | Freq: Once | INTRAMUSCULAR | Status: DC | PRN
  Filled 2016-10-30: qty 1

## 2016-10-30 MED ORDER — FENTANYL 2.5 MCG/ML BUPIVACAINE 1/10 % EPIDURAL INFUSION (WH - ANES)
INTRAMUSCULAR | Status: AC
  Filled 2016-10-30: qty 100

## 2016-10-30 MED ORDER — LACTATED RINGERS IV SOLN: 500.0000 mL | Freq: Once | INTRAVENOUS | Status: DC

## 2016-10-30 MED ORDER — ONDANSETRON HCL 4 MG/2ML IJ SOLN: 4.0000 mg | Freq: Four times a day (QID) | INTRAMUSCULAR | Status: DC | PRN

## 2016-10-30 MED ORDER — FENTANYL 2.5 MCG/ML BUPIVACAINE 1/10 % EPIDURAL INFUSION (WH - ANES)
14.0000 mL/h | INTRAMUSCULAR | Status: DC | PRN
  Administered 2016-10-30: 14 mL/h via EPIDURAL

## 2016-10-30 NOTE — MAU Note (Signed)
Urinating more, leaking more, AFI was lower today, sent from office, ?ROM

## 2016-10-30 NOTE — MAU Note (Signed)
Report given to Dr. Genevie AnnSchenk [facility] of possible rupture [facility] membranes, US revealed low amniotic fluid and the fern test there was negative, upon assessment patient reports that after her exam today had to put on a pad because of vaginal bleeding and that her pad was saturated when she went to the BR in [facility]U, MD to come to evaluate patient.

## 2016-10-30 NOTE — Anesthesia Pain Management Evaluation Note (Signed)
  CRNA Pain Management Visit Note  Patient: Courtney Costa, 41 y.o., female  "Hello I am a [facility] the [facility]. We have an anesthesia team available at all times to provide care throughout the hospital, including epidural management and anesthesia for C-section. I don't know your plan for the delivery whether it a natural birth, water birth, IV sedation, nitrous supplementation, doula or epidural, but we want to meet your pain goals."   1.Was your pain managed to your expectations on prior hospitalizations?   Yes   2.What is your expectation for pain management during this hospitalization?     Epidural and IV pain meds  3.How can we help you reach that goal? epidural  Record the patient's initial score and the patient's pain goal.   Pain: 2  Pain Goal: 6 [facility] wants you to be able to say your pain was always managed very well.  Sotirios Navarro 10/30/2016

## 2016-10-30 NOTE — Patient Instructions (Signed)
Labor Induction Labor induction is when steps are taken to cause a pregnant woman to begin the labor process. Most women go into labor on their own between 37 weeks and 42 weeks of the pregnancy. When this does not happen or when there is a medical need, methods may be used to induce labor. Labor induction causes a pregnant woman's uterus to contract. It also causes the cervix to soften (ripen), open (dilate), and thin out (efface). Usually, labor is not induced before 39 weeks of the pregnancy unless there is a problem with the baby or mother. Before inducing labor, your health care provider will consider a number of factors, including the following:  The medical condition of you and the baby.  How many weeks along you are.  The status of the baby's lung maturity.  The condition of the cervix.  The position of the baby. What are the reasons for labor induction? Labor may be induced for the following reasons:  The health of the baby or mother is at risk.  The pregnancy is overdue by 1 week or more.  The water breaks but labor does not start on its own.  The mother has a health condition or serious illness, such as high blood pressure, infection, placental abruption, or diabetes.  The amniotic fluid amounts are low around the baby.  The baby is distressed. Convenience or wanting the baby to be born on a certain date is not a reason for inducing labor. What methods are used for labor induction? Several methods of labor induction may be used, such as:  Prostaglandin medicine. This medicine causes the cervix to dilate and ripen. The medicine will also start contractions. It can be taken by mouth or by inserting a suppository into the vagina.  Inserting a thin tube (catheter) with a balloon on the end into the vagina to dilate the cervix. Once inserted, the balloon is expanded with water, which causes the cervix to open.  Stripping the membranes. Your health care provider separates  amniotic sac tissue from the cervix, causing the cervix to be stretched and causing the release of a hormone called progesterone. This may cause the uterus to contract. It is often done during an office visit. You will be sent home to wait for the contractions to begin. You will then come in for an induction.  Breaking the water. Your health care provider makes a hole in the amniotic sac using a small instrument. Once the amniotic sac breaks, contractions should begin. This may still take hours to see an effect.  Medicine to trigger or strengthen contractions. This medicine is given through an IV access tube inserted into a vein in your arm. All of the methods of induction, besides stripping the membranes, will be done in the hospital. Induction is done in the hospital so that you and the baby can be carefully monitored. How long does it take for labor to be induced? Some inductions can take up to 2-3 days. Depending on the cervix, it usually takes less time. It takes longer when you are induced early in the pregnancy or if this is your first pregnancy. If a mother is still pregnant and the induction has been going on for 2-3 days, either the mother will be sent home or a cesarean delivery will be needed. What are the risks associated with labor induction? Some of the risks of induction include:  Changes in fetal heart rate, such as too high, too low, or erratic.  Fetal distress.    Chance of infection for the mother and baby.  Increased chance of having a cesarean delivery.  Breaking off (abruption) of the placenta from the uterus (rare).  Uterine rupture (very rare). When induction is needed for medical reasons, the benefits of induction may outweigh the risks. What are some reasons for not inducing labor? Labor induction should not be done if:  It is shown that your baby does not tolerate labor.  You have had previous surgeries on your uterus, such as a myomectomy or the removal of  fibroids.  Your placenta lies very low in the uterus and blocks the opening of the cervix (placenta previa).  Your baby is not in a head-down position.  The umbilical cord drops down into the birth canal in front of the baby. This could cut off the baby's blood and oxygen supply.  You have had a previous cesarean delivery.  There are unusual circumstances, such as the baby being extremely premature. This information is not intended to replace advice given to you by your health care provider. Make sure you discuss any questions you have with your health care provider. Document Released: 01/13/2007 Document Revised: 01/30/2016 Document Reviewed: 03/23/2013 Elsevier Interactive Patient Education  2017 [employer]  

## 2016-10-30 NOTE — Anesthesia Procedure Notes (Signed)
Epidural Patient location during procedure: OB Start time: 10/30/2016 10:00 PM End time: 10/30/2016 10:04 PM  Staffing Anesthesiologist: Leilani AbleHATCHETT, Taheera Thomann  Preanesthetic Checklist Completed: patient identified, surgical consent, pre-op evaluation, timeout performed, IV checked, risks and benefits discussed and monitors and equipment checked  Epidural Patient position: sitting Prep: site prepped and draped and DuraPrep Patient monitoring: continuous pulse ox and blood pressure Approach: midline Location: L3-L4 Injection technique: LOR air  Needle:  Needle type: Tuohy  Needle gauge: 17 G Needle length: 9 cm and 9 Needle insertion depth: 5 cm cm Catheter type: closed end flexible Catheter size: 19 Gauge Catheter at skin depth: 10 cm Test dose: negative and Other  Assessment Sensory level: T10 Events: blood not aspirated, injection not painful, no injection resistance, negative IV test and no paresthesia  Additional Notes Reason for block:procedure for pain

## 2016-10-30 NOTE — Progress Notes (Signed)
   PRENATAL VISIT NOTE  Subjective:  Courtney Costa is a 41 y.o. G3P2002 at 2113w3d being seen today for ongoing prenatal care.  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  Patient reports occasional contractions and Feeling dampness in her underwear since ~2/18. No gross LOF. .  Contractions: Irritability. Vag. Bleeding: Scant.  Movement: Present. Denies leaking of fluid.   The following portions of the patient's history were reviewed and updated as appropriate: allergies, current medications, past family history, past medical history, past social history, past surgical history and problem list. Problem list updated.  Objective:   Vitals:   10/30/16 0941  BP: 114/67  Pulse: 93  Weight: 195 lb (88.5 kg)    Fetal Status: Fetal Heart Rate (bpm): NST-R   Movement: Present  Presentation: Vertex AFI 10cm, but subjectively very low fluid. AFI was 16cm on US 2 weeks ago and 12cm 1 week ago.   General:  Alert, oriented and cooperative. Patient is in no acute distress.  Skin: Skin is warm and dry. No rash noted.   Cardiovascular: Normal heart rate noted  Respiratory: Normal respiratory effort, no problems with respiration noted  Abdomen: Soft, gravid, appropriate for gestational age. Pain/Pressure: Present     Pelvic:  Cervical exam performed Dilation: 1 Effacement (%): Thick Station: -3. Neg pool and fern. Scant VB from spec exam.   Extremities: Normal range of motion.  Edema: Trace  Mental Status: Normal mood and affect. Normal  behavior. Normal judgment and thought content.   Assessment and Plan:  Pregnancy: G3P2002 at 4213w3d  1. Suspected chromosome anomaly of fetus affecting management of mother, antepartum, single or unspecified fetus   2. Supervision of high risk pregnancy, antepartum   3. AMA (advanced maternal age) primigravida 6535+, third trimester   Term labor symptoms and general obstetric precautions including but not limited to vaginal bleeding, contractions, leaking of fluid and fetal movement were reviewed in detail with the patient. Please refer to After Visit Summary for other counseling recommendations.  Sent to MAU for amnisure. Report given to Courtney Costa, CNM.  Courtney Costa, CNM

## 2016-10-30 NOTE — H&P (Signed)
LABOR AND DELIVERY ADMISSION HISTORY AND PHYSICAL NOTE  Courtney Costa is a 41 y.o. female G3P2002 with IUP at [redacted]w[redacted]d by 13 wk ultrasound presenting for concern for rupture of membranes. Patient was seen in the office today for her biweekly testing and found to have an AFI of 10 with subjectively low fluid. AFI the previous 2 weeks were 16 and 12 respectively. She reports she's been having some minimal leaking for the last week or so. After being checked in the office today she's had significant bleeding but denies any pain. Unable to perform to AFI because of bleeding Courtney Costa is negative. 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   She reports positive fetal movement. She denies leakage of fluid or vaginal bleeding.  Prenatal History/Complications:  Past Medical History: Past Medical History:  Diagnosis Date  . Spondylisthesis 05/16/2012  . Spondylisthesis 05/16/2012    Past Surgical History: Past Surgical History:  Procedure Laterality Date  . BREAST REDUCTION SURGERY  08/2014    Obstetrical History: OB History    Gravida Para Term Preterm AB Living   3 2 2     2    SAB TAB Ectopic Multiple Live Births                  Social History: Social History   Social History  . Marital status: Married    Spouse name: N/A  . Number of children: N/A  . Years of education: N/A   Social History Main Topics  . Smoking status: Never Smoker  . Smokeless tobacco: Never Used  . Alcohol use No  . Drug use: No  . Sexual activity: Yes    Birth control/ protection: None   Other Topics Concern  . None   Social History Narrative  . None    Family History: Family History  Problem Relation Age of Onset  . Hypertension Father   . Psoriasis Father   . Skin cancer Other     Allergies: Allergies  Allergen Reactions  . Vagisil [Benzocaine-Resorcinol] Swelling    Prescriptions Prior to Admission  Medication Sig Dispense Refill Last Dose  . aspirin EC 81 MG tablet Take 1 tablet (81 mg  total) by mouth daily. 30 tablet 7 Past Week at Unknown time  . calcium carbonate (TUMS - DOSED IN MG ELEMENTAL CALCIUM) 500 MG chewable tablet Chew 1 tablet by mouth 3 (three) times daily as needed for indigestion or heartburn.   10/29/2016  . cetirizine (ZYRTEC) 10 MG tablet Take 1 tablet (10 mg total) by mouth daily. 30 tablet 6 Past Month at Unknown time  . Prenatal Vit-Fe Fumarate-FA (MULTIVITAMIN-PRENATAL) 27-0.8 MG TABS tablet Take 1 tablet by mouth daily at 12 noon. 30 each 6 10/29/2016  . ranitidine (ZANTAC) 300 MG tablet Take 1 tablet (300 mg total) by mouth 2 (two) times daily. 180 tablet 3 10/29/2016     Review of Systems   All systems reviewed and negative except as stated in HPI  Blood pressure 120/74, pulse 85, temperature 99.4 F (37.4 C), temperature source Oral, resp. rate 18, height 5\' 2"  (1.575 m), weight 195 lb (88.5 kg), last menstrual period 01/21/2016. General appearance: alert, cooperative and appears stated age Lungs:No respiratory distress Heart: Regular rate, intact distal pulses Abdomen: soft, non-tender; bowel sounds normal Extremities: No calf swelling or tenderness Presentation: cephalic by my exam Fetal monitoring: category 1 Uterine activity: no contractions Dilation: 1 Effacement (%): Thick Exam by:: Dr. Genevie Ann   Prenatal labs: ABO, Rh: --/--/B POS (02/23  1407) Antibody: PENDING (02/23 1407) Rubella: immune RPR: NON REAC (12/08 0842)  HBsAg: NEGATIVE (08/21 1113)  HIV: NONREACTIVE (12/08 0842)  GBS: Negative (02/07 0000)  1 hr Glucola: 124 Genetic screening:  NIPS showing trisomy 21 Anatomy US: Absent nasal bone  Prenatal Transfer Tool  Maternal Diabetes: No Genetic Screening: Abnormal:  Results: Trisomy 21 Maternal Ultrasounds/Referrals: Abnormal:  Findings:   Absent nasal bone Fetal Ultrasounds or other Referrals:  Fetal echo normal Maternal Substance Abuse:  No Significant Maternal Medications:  None Significant Maternal Lab Results:  Lab values include: Group B Strep negative  Results for orders placed or performed during the hospital encounter of 10/30/16 (from the past 24 hour(s))  CBC   Collection Time: 10/30/16  2:07 PM  Result Value Ref Range   WBC 10.8 (H) 4.0 - 10.5 K/uL   RBC 4.15 3.87 - 5.11 MIL/uL   Hemoglobin 13.0 12.0 - 15.0 g/dL   HCT 16.137.3 09.636.0 - 04.546.0 %   MCV 89.9 78.0 - 100.0 fL   MCH 31.3 26.0 - 34.0 pg   MCHC 34.9 30.0 - 36.0 g/dL   RDW 40.913.9 81.111.5 - 91.415.5 %   Platelets 236 150 - 400 K/uL  Type and screen [facility]   Collection Time: 10/30/16  2:07 PM  Result Value Ref Range   ABO/RH(D) B POS    Antibody Screen PENDING    Sample Expiration 11/02/2016     Patient Active Problem List   Diagnosis Date Noted  . Placental abruption 10/30/2016  . AMA (advanced maternal age) primigravida 7235+, third trimester 09/11/2016  . Plantar wart of right foot 07/01/2016  . Suspected fetal chromosome anomaly affecting antepartum care of mother 05/06/2016  . Heartburn during pregnancy 05/04/2016  . Supervision of high risk pregnancy, antepartum 04/28/2016  . S/P bilateral breast reduction 04/28/2016  . Cough 04/20/2016  . Obesity 02/21/2016  . Anxiety as acute reaction to exceptional stress 07/02/2014  . Insomnia 07/02/2014  . Depression 07/02/2014  . Breast mass, right 04/03/2014  . Spondylisthesis 05/16/2012    Assessment: Courtney Costa is a 41 y.o. G3P2002 at 4216w3d here for Induction of labor for possible abruption with significant bleeding.  #Labor: Induce with by mouth Cytotec plan to start Pitocin if contractions start with a single Cytotec. #Pain: Plans for epidural, can use IV pain medicine prior #FWB: Category 1 #ID:  GBS negative #MOF: Breast-feeding but does have some history of breast reduction so was concerned that it may not be adequate. #MOC: Vasectomy #Circ:  N/A  Ernestina PennaNicholas Trueman Worlds MD 10/30/2016, 3:11 PM

## 2016-10-30 NOTE — Anesthesia Preprocedure Evaluation (Signed)
Anesthesia Evaluation  Patient identified by MRN, date of birth, ID band Patient awake    Reviewed: Allergy & Precautions, H&P , NPO status , Patient's Chart, lab work & pertinent test results  Airway Mallampati: II  TM Distance: >3 FB Neck ROM: full    Dental no notable dental hx.    Pulmonary neg pulmonary ROS,    Pulmonary exam normal        Cardiovascular negative cardio ROS Normal cardiovascular exam     Neuro/Psych negative neurological ROS     GI/Hepatic negative GI ROS, Neg liver ROS,   Endo/Other  negative endocrine ROS  Renal/GU negative Renal ROS     Musculoskeletal negative musculoskeletal ROS (+)   Abdominal (+) + obese,   Peds  Hematology negative hematology ROS (+)   Anesthesia Other Findings   Reproductive/Obstetrics (+) Pregnancy                             Anesthesia Physical Anesthesia Plan  ASA: II  Anesthesia Plan: Epidural   Post-op Pain Management:    Induction:   Airway Management Planned:   Additional Equipment:   Intra-op Plan:   Post-operative Plan:   Informed Consent: I have reviewed the patients History and Physical, chart, labs and discussed the procedure including the risks, benefits and alternatives for the proposed anesthesia with the patient or authorized representative who has indicated his/her understanding and acceptance.     Plan Discussed with:   Anesthesia Plan Comments:         Anesthesia Quick Evaluation

## 2016-10-31 LAB — RPR: RPR Ser Ql: NONREACTIVE

## 2016-10-31 MED ORDER — ONDANSETRON HCL 4 MG/2ML IJ SOLN: 4.0000 mg | INTRAMUSCULAR | Status: DC | PRN

## 2016-10-31 MED ORDER — SENNOSIDES-DOCUSATE SODIUM 8.6-50 MG PO TABS
2.0000 | ORAL_TABLET | ORAL | Status: DC
  Administered 2016-10-31 (×2): 2 via ORAL
  Filled 2016-10-31 (×2): qty 2

## 2016-10-31 MED ORDER — DIPHENHYDRAMINE HCL 25 MG PO CAPS: 25.0000 mg | ORAL_CAPSULE | Freq: Four times a day (QID) | ORAL | Status: DC | PRN

## 2016-10-31 MED ORDER — ACETAMINOPHEN 325 MG PO TABS: 650.0000 mg | ORAL_TABLET | ORAL | Status: DC | PRN

## 2016-10-31 MED ORDER — COCONUT OIL OIL: 1.0000 "application " | TOPICAL_OIL | Status: DC | PRN

## 2016-10-31 MED ORDER — SIMETHICONE 80 MG PO CHEW: 80.0000 mg | CHEWABLE_TABLET | ORAL | Status: DC | PRN

## 2016-10-31 MED ORDER — DIBUCAINE 1 % RE OINT: 1.0000 "application " | TOPICAL_OINTMENT | RECTAL | Status: DC | PRN

## 2016-10-31 MED ORDER — TETANUS-DIPHTH-ACELL PERTUSSIS 5-2.5-18.5 LF-MCG/0.5 IM SUSP: 0.5000 mL | Freq: Once | INTRAMUSCULAR | Status: DC

## 2016-10-31 MED ORDER — ZOLPIDEM TARTRATE 5 MG PO TABS: 5.0000 mg | ORAL_TABLET | Freq: Every evening | ORAL | Status: DC | PRN

## 2016-10-31 MED ORDER — OXYCODONE HCL 5 MG PO TABS: 5.0000 mg | ORAL_TABLET | ORAL | Status: DC | PRN

## 2016-10-31 MED ORDER — IBUPROFEN 600 MG PO TABS
600.0000 mg | ORAL_TABLET | Freq: Four times a day (QID) | ORAL | Status: DC
  Administered 2016-10-31 – 2016-11-01 (×7): 600 mg via ORAL
  Filled 2016-10-31 (×7): qty 1

## 2016-10-31 MED ORDER — ONDANSETRON HCL 4 MG PO TABS: 4.0000 mg | ORAL_TABLET | ORAL | Status: DC | PRN

## 2016-10-31 MED ORDER — PRENATAL MULTIVITAMIN CH
1.0000 | ORAL_TABLET | Freq: Every day | ORAL | Status: DC
  Administered 2016-10-31 – 2016-11-01 (×2): 1 via ORAL
  Filled 2016-10-31 (×2): qty 1

## 2016-10-31 MED ORDER — WITCH HAZEL-GLYCERIN EX PADS
1.0000 | MEDICATED_PAD | CUTANEOUS | Status: DC | PRN
Start: 2016-10-31 — End: 2016-11-01

## 2016-10-31 NOTE — Lactation Note (Signed)
This note was copied from a baby's chart. Lactation Consultation Note  Patient Name: Girl Drema PryLaura Hachey BJYNW'GToday's Date: 10/31/2016  Breastfeeding consultation services and support information given and reviewed with mom.  Mom has a history of a breast reduction 2 years ago.  Recommended patient view bfar.org site for education about BF after breast surgery.  Discussed the possibility of low milk supply due to surgery.  Very small drop of colostrum from right breast and none noted on left.  Newborn was diagnosed prenatally with trisomy 2021.  Plan is to put baby to breast with feeding cues, post pump every 3 hours x 15 minutes and supplement with 10 mls of expressed milk/formula by bottle.  Observed mom bottle feed baby and noted a good suck with little dribbling of milk. Symphony pump set up and initiated for breast stimulation.  Encouraged to call for assist/concerns prn.   Maternal Data    Feeding Feeding Type: Bottle Fed - Formula Nipple Type: Slow - flow  LATCH Score/Interventions                      Lactation Tools Discussed/Used     Consult Status      Huston FoleyMOULDEN, Paula S 10/31/2016, 3:42 PM

## 2016-10-31 NOTE — Anesthesia Postprocedure Evaluation (Signed)
Anesthesia Post Note  Patient: Courtney CanterburyLaura Elizabeth Marcellus  Procedure(s) Performed: * No procedures listed *  Patient location during evaluation: Mother Baby Anesthesia Type: Epidural Level of consciousness: awake and alert and oriented Pain management: pain level controlled Vital Signs Assessment: post-procedure vital signs reviewed and stable Respiratory status: spontaneous breathing and nonlabored ventilation Cardiovascular status: stable Postop Assessment: no headache, patient able to bend at knees, no backache, no signs of nausea or vomiting, epidural receding and adequate PO intake Anesthetic complications: no        Last Vitals:  Vitals:   10/31/16 0114 10/31/16 0621  BP: 120/79 114/61  Pulse: 83 70  Resp: 17 18  Temp: 36.8 C 36.8 C    Last Pain:  Vitals:   10/31/16 0621  TempSrc: Oral  PainSc: 5    Pain Goal:                 Laban EmperorMalinova,Nikita Humble Hristova

## 2016-11-01 ENCOUNTER — Ambulatory Visit: Payer: Self-pay

## 2016-11-01 MED ORDER — IBUPROFEN 600 MG PO TABS: 600.0000 mg | ORAL_TABLET | Freq: Four times a day (QID) | ORAL | 0 refills | Status: DC

## 2016-11-01 NOTE — Discharge Summary (Signed)
OB Discharge Summary  Patient Name: Courtney Costa DOB: [birth year] MRN: [id number]  Date of admission: 10/30/2016 Delivering MD: Courtney Costa   Date of discharge: 11/01/2016  Admitting diagnosis: 39WKS LOW AMNIOTIC FLUID Intrauterine pregnancy: 1416w3d     Secondary diagnosis:Active Problems:   Placental abruption  Additional problems:none     Discharge diagnosis: Term Pregnancy Delivered                                                                     Post partum procedures:n/a  Augmentation: n/a  Complications: None  Hospital course:  Induction of Labor With Vaginal Delivery   41 y.o. yo G3P3003 at 4616w3d was admitted to the hospital 10/30/2016 for induction of labor.  Indication for induction: ? abruption infant trisomy 4121.  Patient had an uncomplicated labor course as follows: Membrane Rupture Time/Date: 10:35 PM ,10/30/2016   Intrapartum Procedures: Episiotomy: None [1]                                         Lacerations:  None [1]  Patient had delivery of a Viable infant.  Information for the patient's newborn:  Courtney Costa, Courtney Costa [[id number]][[id number]]  Delivery Method: Vaginal, Spontaneous Delivery (Filed from Delivery Summary)   10/30/2016  Details of delivery can be found in separate delivery note.  Patient had a routine postpartum course. Patient is discharged home 11/01/16.  Physical exam  Vitals:   10/31/16 0114 10/31/16 0621 10/31/16 1800 11/01/16 0610  BP: 120/79 114/61 120/75 104/62  Pulse: 83 70 92 69  Resp: 17 18 18 18   Temp: 98.2 F (36.8 C) 98.3 F (36.8 C) 98.1 F (36.7 C) 98.3 F (36.8 C)  TempSrc: Oral Oral    SpO2: 97%  100%   Weight:      Height:       General: alert, cooperative and no distress Lochia: appropriate Uterine Fundus: firm Incision: N/A DVT Evaluation: No evidence of DVT seen on physical exam. Labs: Lab Results  Component Value Date   WBC 10.8 (H) 10/30/2016   HGB 13.0 10/30/2016   HCT 37.3 10/30/2016    MCV 89.9 10/30/2016   PLT 236 10/30/2016   CMP Latest Ref Rng & Units 03/26/2016  Glucose 65 - 99 mg/dL 78  BUN 7 - 25 mg/dL 9  Creatinine 7.820.50 - 9.561.10 mg/dL 2.130.59  Sodium 086135 - 578146 mmol/L 136  Potassium 3.5 - 5.3 mmol/L 4.4  Chloride 98 - 110 mmol/L 103  CO2 20 - 31 mmol/L 24  Calcium 8.6 - 10.2 mg/dL 9.3  Total Protein 6.1 - 8.1 g/dL 6.5  Total Bilirubin 0.2 - 1.2 mg/dL 0.6  Alkaline Phos 33 - 115 U/L 40  AST 10 - 30 U/L 11  ALT 6 - 29 U/L 12    Discharge instruction: per After Visit Summary and "Baby and Me Booklet".  After Visit Meds:  Allergies as of 11/01/2016      Reactions   Vagisil [benzocaine-resorcinol] Swelling      Medication List    STOP taking these medications   aspirin EC 81 MG tablet   calcium carbonate 500  MG chewable tablet Commonly known as:  TUMS - dosed in mg elemental calcium   cetirizine 10 MG tablet Commonly known as:  ZYRTEC     TAKE these medications   ibuprofen 600 MG tablet Commonly known as:  ADVIL,MOTRIN Take 1 tablet (600 mg total) by mouth every 6 (six) hours.   multivitamin-prenatal 27-0.8 MG Tabs tablet Take 1 tablet by mouth daily at 12 noon.   ranitidine 300 MG tablet Commonly known as:  ZANTAC Take 1 tablet (300 mg total) by mouth 2 (two) times daily.       Diet: routine diet  Activity: Advance as tolerated. Pelvic rest for 6 weeks.   Outpatient follow up:6 weeks Follow up Appt:Future Appointments Date Time Provider Department Center  12/14/2016 1:30 PM Allie Bossier, MD [facility]   Follow up visit: No Follow-up on file.  Postpartum contraception: Vasectomy  Newborn Data: Live born female  Birth Weight: 6 lb 9 oz (2977 g) APGAR: 9, 9  Baby Feeding: Breast Disposition:home with mother   11/01/2016 Wyvonnia Dusky, CNM

## 2016-11-01 NOTE — Lactation Note (Signed)
This note was copied from a baby's chart. Lactation Consultation Note  Patient Name: Girl Drema PryLaura Macaulay ZOXWR'UToday's Date: 11/01/2016  Mom states that baby is not latching to breast but doing very well with bottle.  Mom continues to pump every 3 hours but not obtaining colostrum yet.  Encouraged to continue pumping.   Maternal Data    Feeding    LATCH Score/Interventions                      Lactation Tools Discussed/Used     Consult Status      Huston FoleyMOULDEN, Graciela S 11/01/2016, 2:37 PM

## 2016-11-01 NOTE — Progress Notes (Signed)
CLINICAL SOCIAL WORK MATERNAL/CHILD NOTE  Patient Details  Name: Courtney Costa MRN: [id number] Date of Birth: 10/30/2018  Date:  11/01/2016  Clinical Social Worker Initiating Note:  Kylon Philbrook, LCSW Date/ Time Initiated:  11/01/16/1000     Child's Name:  Courtney Costa   Legal Guardian:  Other (Comment) (Lakeita and Raul Eddinger)   Need for Interpreter:  None   Date of Referral:  10/31/16     Reason for Referral:  Other (Comment), Parental Support of Children with Anomalies/Syndromes  (Hx of Dep/Anx)   Referral Source:  Central Nursery   Address:  [address] [place], [place] [address]  Phone number:  [phone]   Household Members:  Spouse, Minor Children (Couple has two sons at home: Joey (13) and Donnie (9))   Natural Supports (not living in the home):  Extended Family, Friends, Immediate Family   Professional Supports: None   Employment:     Type of Work: Both parents work and report being under a lot of stress due to the company going bankrupt and bought by another company.  MOB works as an HR Supervisor.   Education:      Financial Resources:  Private Insurance   Other Resources:      Cultural/Religious Considerations Which May Impact Care: None stated.  MOB reports "we are very faith based."  Strengths:  Ability to meet basic needs , Pediatrician chosen , Understanding of illness, Home prepared for child  (Pediatric follow up will be with Dr. Corey Evans)   Risk Factors/Current Problems:  None   Cognitive State:  Able to Concentrate , Alert , Linear Thinking , Insightful , Goal Oriented    Mood/Affect:  Tearful , Interested , Calm , Relaxed    CSW Assessment: CSW met with parents in MOB's first floor room/143 to offer support and complete assessment due to hx of anxiety and depression.  CSW notes from chart review that baby was diagnosed with Trisomy 21 from NIPS.   Parents were pleasant and welcoming for CSW's visit.  FOB  was quiet, but involved in the conversation.  He appears supportive.  MOB was quiet, but talkative and seemed open to processing her feelings regarding baby's diagnosis.  She reports "it was hard," when she found out that baby was at increased risk for Trisomy 21 and admits that they were hopeful that it would not be the case at birth.  Parents state they are still processing that baby has Trisomy 21, but are accepting of the diagnosis.  MOB reports that they have been trying to have another baby for a long time and feel like baby is "perfect."  CSW agrees with her statement and talked about how every baby is unique and special in their own way.  CSW informed them of support available through [facility] Support Network and the Down Syndrome Support Network.  CSW asked if parents would be receptive to a visit from a staff of [facility].  Parents state they would love for this to occur.  CSW provided them with the phone number to the [facility] office in the event [facility] staff is not available tomorrow for a visit in the hospital.  CSW explained that staff is not here on weekends (today).  CSW notes that family resides in [place], and that resources in that area can also be discussed by [facility] staff here.  Parents seemed very appreciative of this information and very open to support services.   CSW inquired about MOB's   mental health overall, not specific to baby's diagnosis.  MOB states she has been feeling well now and during the pregnancy.  She denies any history of anxiety and depression, however, states both she and FOB have been under "a lot of stress" at work.  She denies need for mental health follow up at this time.  CSW provided education regarding PMADs and encouraged parents to speak with a medical professional if they have concerns about their emotions/mental health at any time.  Parents were agreeable.  CSW provided MOB with a New Mom Checklist as a self-evaluation tool.  Parents were appreciative of the visit  and for CSW's concern for their emotional wellbeing.  They report no further questions, concerns or needs at this time.  CSW Plan/Description:  Information/Referral to [employer] , No Further Intervention Required/No Barriers to Discharge, Patient/Family Education     Alphonzo Cruise, [place] 11/01/2016, 12:57 PM

## 2016-11-02 ENCOUNTER — Encounter: Payer: Self-pay | Admitting: Family Medicine

## 2016-11-02 ENCOUNTER — Ambulatory Visit: Payer: Self-pay

## 2016-11-02 LAB — NICOTINE
Albumin, Serum: 3.5
Globulin: 2.5
Nicotine: NEGATIVE
Total Protein: 6 g/dL

## 2016-11-02 NOTE — Lactation Note (Signed)
This note was copied from a baby's chart. Lactation Consultation Note  Patient Name: Courtney Costa   Mom has decided to formula feed only. She has had no breast changes since birth & reports seeing nothing more than drops with pumping. In addition, she reports that she did not have a full supply with her other 2 children (prior to breast surgery). She also shared that her breast surgeon told her she would have a difficult time w/lactation, as her nipple/areola complex were removed from the breasts during her reduction & then reattached.   Mom is now giving regular formula.   Courtney Costa, Courtney Costa [facility], 8:33 AM

## 2016-11-03 ENCOUNTER — Inpatient Hospital Stay (HOSPITAL_COMMUNITY): Admission: RE | Admit: 2016-11-03 | Payer: BLUE CROSS/BLUE SHIELD | Source: Ambulatory Visit

## 2016-11-03 ENCOUNTER — Inpatient Hospital Stay (HOSPITAL_COMMUNITY): Payer: BLUE CROSS/BLUE SHIELD

## 2016-11-26 ENCOUNTER — Telehealth: Payer: Self-pay | Admitting: Family Medicine

## 2016-11-26 MED ORDER — DICLOFENAC SODIUM 1 % TD GEL: 2.0000 g | Freq: Four times a day (QID) | TRANSDERMAL | 11 refills | Status: DC

## 2016-11-26 NOTE — Telephone Encounter (Signed)
Here with her baby for well child check.  [name] notes right hand dequervians.  We will try voltaren gel.  If not better return for recheck.  We can schedule back to back visit.

## 2016-12-14 ENCOUNTER — Ambulatory Visit (INDEPENDENT_AMBULATORY_CARE_PROVIDER_SITE_OTHER): Payer: BLUE CROSS/BLUE SHIELD | Admitting: Obstetrics & Gynecology

## 2016-12-14 ENCOUNTER — Encounter: Payer: Self-pay | Admitting: Obstetrics & Gynecology

## 2016-12-14 VITALS — BP 127/72 | HR 73 | Resp 16 | Ht 62.0 in | Wt 180.0 lb

## 2016-12-14 DIAGNOSIS — Z0000 Encounter for general adult medical examination without abnormal findings: Secondary | ICD-10-CM

## 2016-12-14 NOTE — Progress Notes (Signed)
Post Partum Exam  Courtney Costa is a 41 y.o.MH  [id number] female who presents for a postpartum visit. She is 6 weeks postpartum following a spontaneous vaginal delivery. I have fully reviewed the prenatal and intrapartum course. The delivery was at [redacted]w[redacted]d gestational weeks.  Anesthesia: epidural. Postpartum course has been unremarkable. Baby's course has been unremarkable.  She does have Down's syndrome but mother denies any other issues.Pecola Leisure is feeding by bottle - Similac Pro advanced. Bleeding no bleeding. Bowel function is normal. Bladder function is normal. Patient is sexually active. Contraception method is vasectomy. Postpartum depression screening:neg  The following portions of the patient's history were reviewed and updated as appropriate: allergies, current medications, past family history, past medical history, past social history, past surgical history and problem list.  Review of Systems Pertinent items are noted in HPI.    Objective:  Blood pressure 127/72, pulse 73, resp. rate 16, height  (1.575 m), weight 180 lb (81.6 kg), last menstrual period 12/14/2016, not currently breastfeeding.  General:  alert   Breasts:  inspection negative, no nipple discharge or bleeding, no masses or nodularity palpable  Lungs: clear to auscultation bilaterally  Heart:  regular rate and rhythm, S1, S2 normal, no murmur, click, rub or gallop  Abdomen: soft, non-tender; bowel sounds normal; no masses,  no organomegaly   Vulva:  normal  Vagina: normal vagina  Cervix:  not examined  Corpus: not examined  Adnexa:  not evaluated  Rectal Exam: Not performed.        Assessment:    Normal postpartum exam. Pap smear not done at today's visit.   Plan:   1. Contraception: condoms 3. Follow up in: 2 years or as needed.  Mammogram in the next few months

## 2016-12-15 ENCOUNTER — Other Ambulatory Visit: Payer: Self-pay | Admitting: Obstetrics & Gynecology

## 2016-12-15 DIAGNOSIS — Z1231 Encounter for screening mammogram for malignant neoplasm of breast: Secondary | ICD-10-CM

## 2017-01-05 ENCOUNTER — Ambulatory Visit: Payer: BLUE CROSS/BLUE SHIELD

## 2017-01-25 ENCOUNTER — Ambulatory Visit
Admission: RE | Admit: 2017-01-25 | Discharge: 2017-01-25 | Disposition: A | Discharge status: Home / Self Care | Payer: BLUE CROSS/BLUE SHIELD | Source: Ambulatory Visit | Attending: Obstetrics & Gynecology | Admitting: Obstetrics & Gynecology

## 2017-01-25 DIAGNOSIS — Z1231 Encounter for screening mammogram for malignant neoplasm of breast: Secondary | ICD-10-CM

## 2017-04-30 ENCOUNTER — Encounter: Payer: Self-pay | Admitting: Family Medicine

## 2017-08-09 ENCOUNTER — Encounter: Payer: Self-pay | Admitting: Family Medicine

## 2017-08-10 MED ORDER — CLOBETASOL PROPIONATE 0.05 % EX CREA: 1.0000 "application " | TOPICAL_CREAM | Freq: Two times a day (BID) | CUTANEOUS | 2 refills | Status: DC

## 2018-06-17 ENCOUNTER — Encounter: Payer: Self-pay | Admitting: Sports Medicine

## 2018-06-17 ENCOUNTER — Ambulatory Visit (INDEPENDENT_AMBULATORY_CARE_PROVIDER_SITE_OTHER): Payer: BLUE CROSS/BLUE SHIELD

## 2018-06-17 ENCOUNTER — Ambulatory Visit: Payer: BLUE CROSS/BLUE SHIELD | Admitting: Sports Medicine

## 2018-06-17 DIAGNOSIS — R05 Cough: Secondary | ICD-10-CM | POA: Diagnosis not present

## 2018-06-17 DIAGNOSIS — R059 Cough, unspecified: Secondary | ICD-10-CM

## 2018-06-17 DIAGNOSIS — R509 Fever, unspecified: Secondary | ICD-10-CM

## 2018-06-17 MED ORDER — FLUCONAZOLE 150 MG PO TABS: 150.0000 mg | ORAL_TABLET | Freq: Once | ORAL | 0 refills | Status: AC

## 2018-06-17 MED ORDER — AZITHROMYCIN 250 MG PO TABS: ORAL_TABLET | ORAL | 0 refills | Status: DC

## 2018-06-17 MED ORDER — FLUTICASONE PROPIONATE 50 MCG/ACT NA SUSP: NASAL | 3 refills | Status: DC

## 2018-06-17 NOTE — Assessment & Plan Note (Signed)
Left upper lobe coarse sounds with localized wheezing. Also with facial pain and pressure, radiation to the teeth, this is likely a sinusitis as well as a pneumonia. Azithromycin, Flonase. Frequent yeast infection so adding Diflucan. Chest x-ray.

## 2018-06-17 NOTE — Progress Notes (Signed)
Subjective:    CC: Feeling sick  HPI: For the past several days this pleasant 42 year old female has had increasing cough, wheezing at night, pain and pressure over her face with radiation to her upper teeth, no fevers, chills, symptoms are moderate, persistent.  No skin rash, no GI symptoms, no chest pain or shortness of breath.  I reviewed the past medical history, family history, social history, surgical history, and allergies today and no changes were needed.  Please see the problem list section below in epic for further details.  Past Medical History: Past Medical History:  Diagnosis Date  . Spondylisthesis 05/16/2012  . Spondylisthesis 05/16/2012   Past Surgical History: Past Surgical History:  Procedure Laterality Date  . BREAST REDUCTION SURGERY  08/2014   Social History: Social History   Socioeconomic History  . Marital status: Married    Spouse name: Not on file  . Number of children: Not on file  . Years of education: Not on file  . Highest education level: Not on file  Occupational History  . Not on file  Social Needs  . Financial resource strain: Not on file  . Food insecurity:    Worry: Not on file    Inability: Not on file  . Transportation needs:    Medical: Not on file    Non-medical: Not on file  Tobacco Use  . Smoking status: Never Smoker  . Smokeless tobacco: Never Used  Substance and Sexual Activity  . Alcohol use: No  . Drug use: No  . Sexual activity: Yes    Birth control/protection: None  Lifestyle  . Physical activity:    Days per week: Not on file    Minutes per session: Not on file  . Stress: Not on file  Relationships  . Social connections:    Talks on phone: Not on file    Gets together: Not on file    Attends religious service: Not on file    Active member of club or organization: Not on file    Attends meetings of clubs or organizations: Not on file    Relationship status: Not on file  Other Topics Concern  . Not on file    Social History Narrative  . Not on file   Family History: Family History  Problem Relation Age of Onset  . Hypertension Father   . Psoriasis Father   . Skin cancer Other   . Breast cancer Neg Hx    Allergies: Allergies  Allergen Reactions  . Vagisil [Benzocaine-Resorcinol] Swelling   Medications: See med rec.  Review of Systems: No fevers, chills, night sweats, weight loss, chest pain, or shortness of breath.   Objective:    General: Well Developed, well nourished, and in no acute distress.  Neuro: Alert and oriented x3, extra-ocular muscles intact, sensation grossly intact.  HEENT: Normocephalic, atraumatic, pupils equal round reactive to light, neck supple, no masses, no lymphadenopathy, thyroid nonpalpable.  Oropharynx, nasopharynx, ear canals unremarkable, minimal tenderness over the frontal and maxillary sinuses. Skin: Warm and dry, no rashes. Cardiac: Regular rate and rhythm, no murmurs rubs or gallops, no lower extremity edema.  Respiratory: Coarse rhonchi in the left upper lobe with localized wheeze. Not using accessory muscles, speaking in full sentences.  Impression and Recommendations:    Cough Left upper lobe coarse sounds with localized wheezing. Also with facial pain and pressure, radiation to the teeth, this is likely a sinusitis as well as a pneumonia. Azithromycin, Flonase. Frequent yeast infection so adding Diflucan.  Chest x-ray.  ___________________________________________ Ihor Austin. Benjamin Stain, M.D., ABFM., CAQSM. Primary Care and Sports Medicine [facility]  Adjunct Instructor of Family Medicine  [employer]

## 2018-06-22 ENCOUNTER — Encounter: Payer: Self-pay | Admitting: Sports Medicine

## 2018-06-23 MED ORDER — HYDROCOD POLST-CPM POLST ER 10-8 MG/5ML PO SUER: 5.0000 mL | Freq: Two times a day (BID) | ORAL | 0 refills | Status: DC | PRN

## 2018-07-16 IMAGING — US US MFM OB FOLLOW-UP
1 series · 14 of 28 positions shown · non-contrast
Comparison: none

[Series 1: us mfm ob follow-up · 92 acquisitions, 14 frames shown]
[im 4/92]
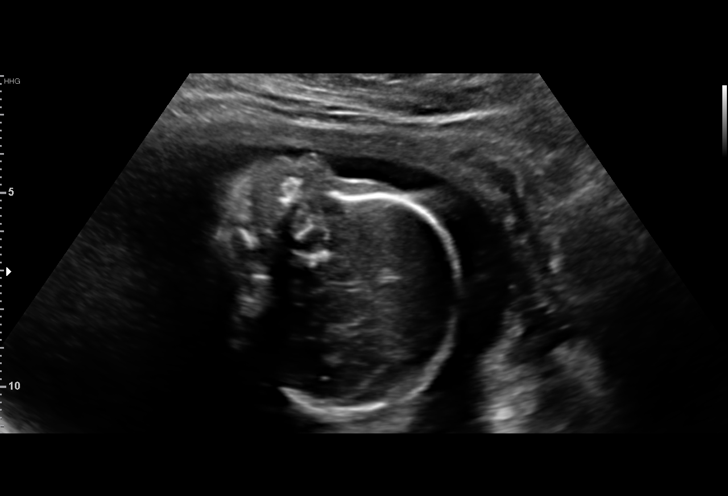
[im 11/92]
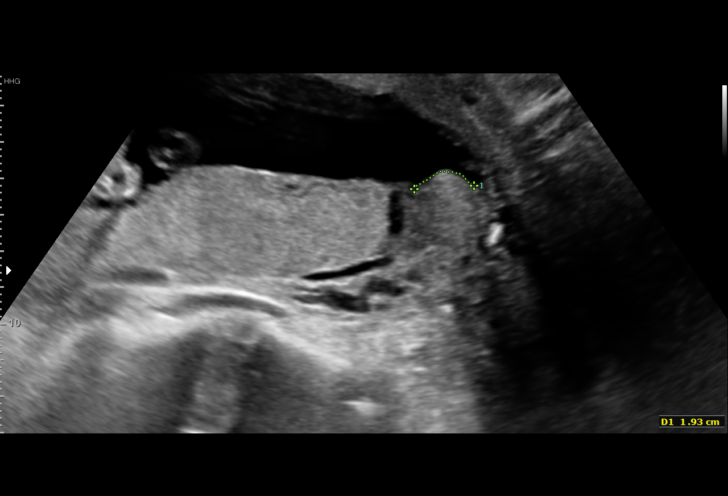
[im 17/92]
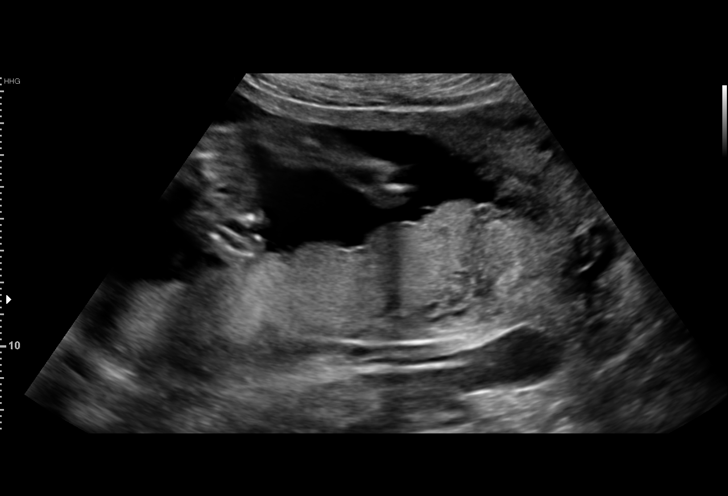
[im 24/92]
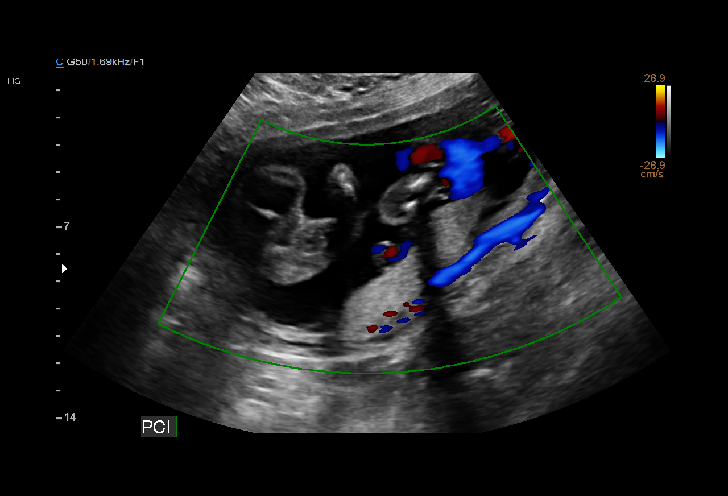
[im 31/92]
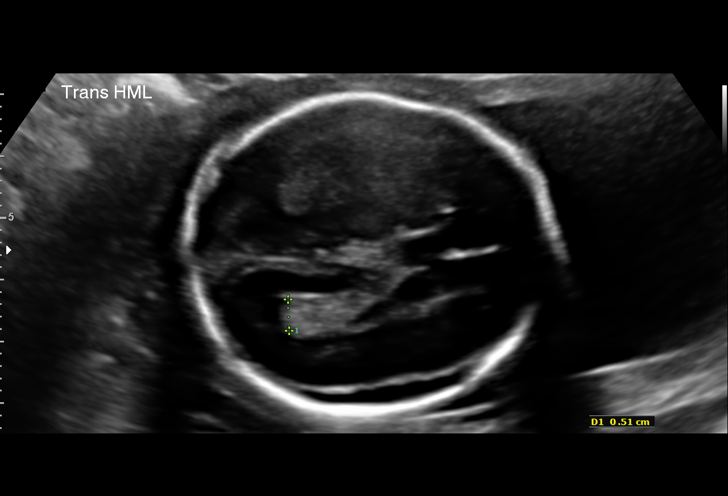
[im 38/92]
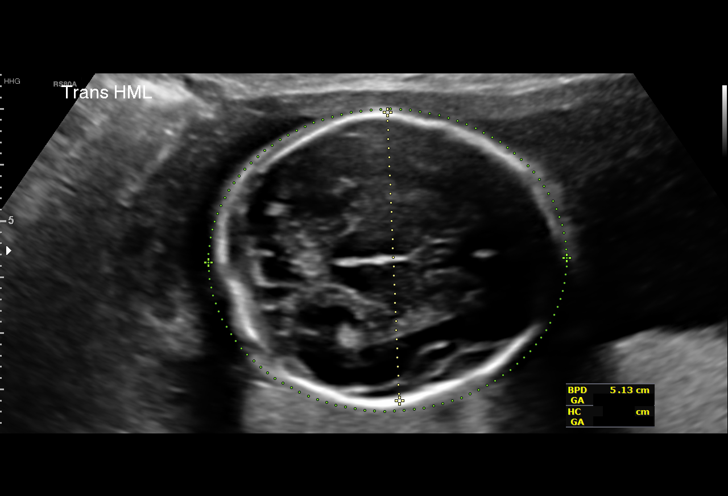
[im 44/92]
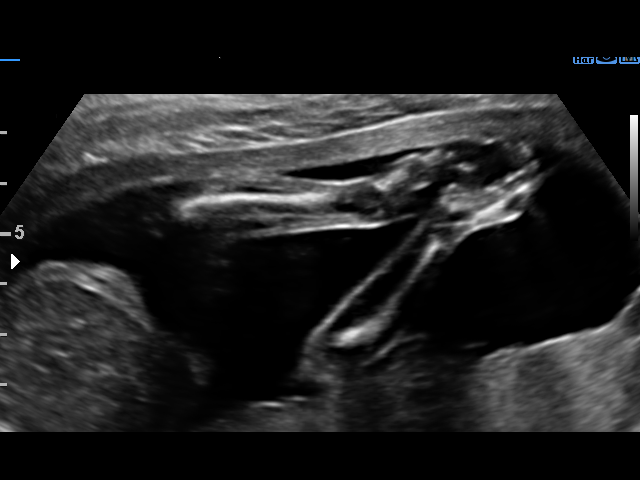
[im 51/92]
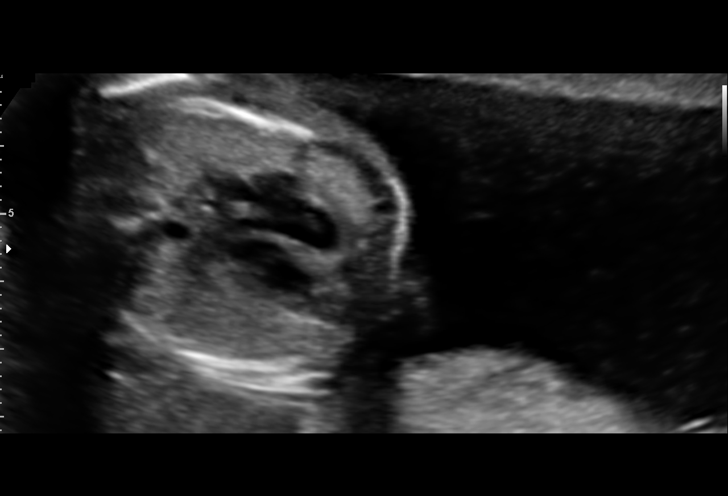
[im 58/92]
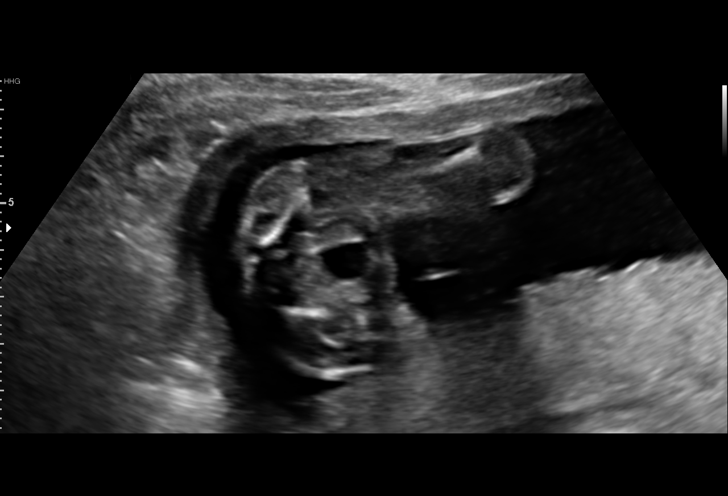
[im 65/92]
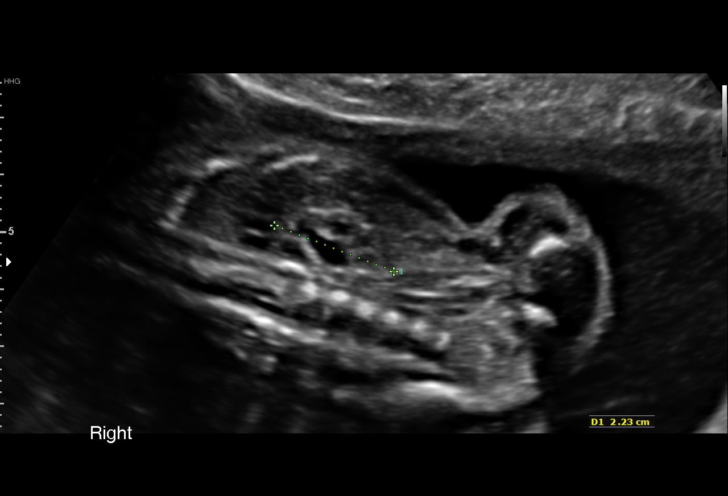
[im 71/92]
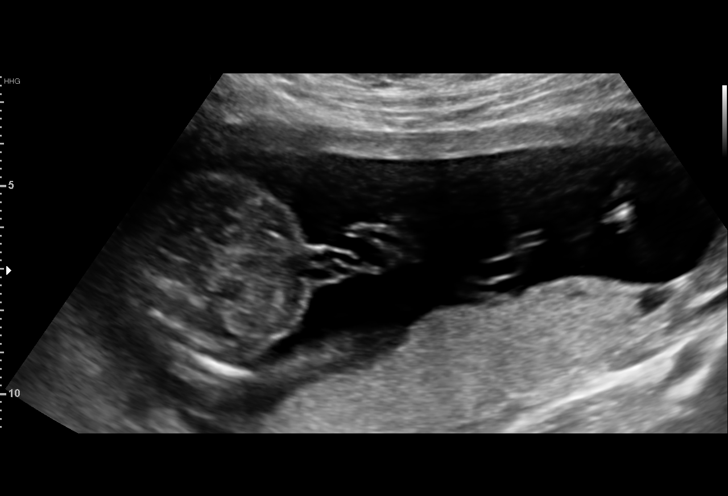
[im 78/92]
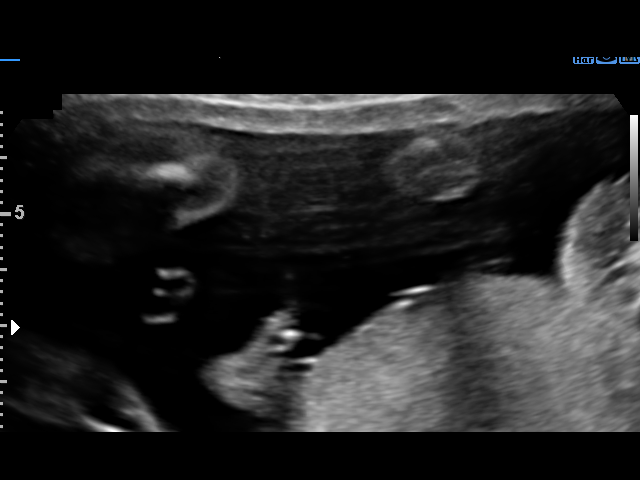
[im 85/92]
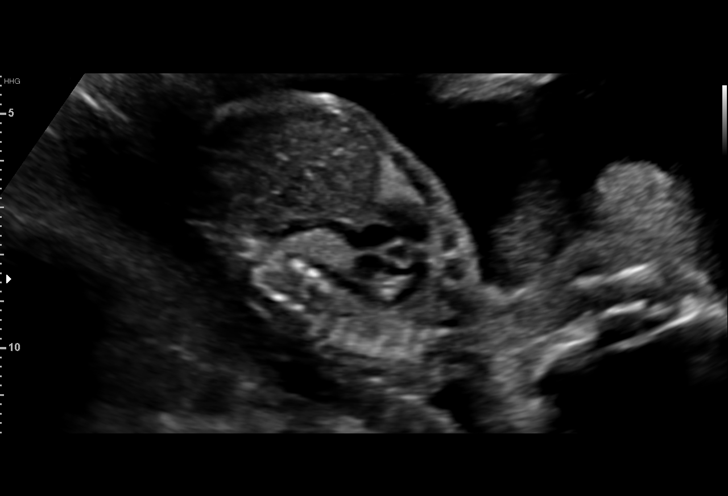
[im 92/92]
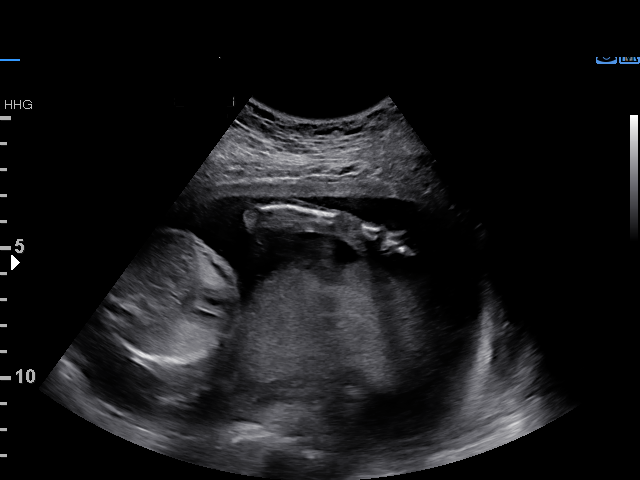

[14 of 28 positions shown; findings below may reference images not displayed]

[REDACTED]

1  MUTWAKEL ANOOR            [id number]      [id number]     [id number]
Indications

21 weeks gestation of pregnancy
Advanced maternal age multigravida 35+
(40), second trimester
Abnormal chromosomal and genetic finding
on antenatal screening of mother; high risk
NIPS for trisomy 21
OB History

Blood Type:            Height:  5'1"   Weight (lb):  190      BMI:
Gravidity:    3         Term:   2
Living:       2
Fetal Evaluation

Num Of Fetuses:     1
Fetal Heart         158
Rate(bpm):
Cardiac Activity:   Observed
Presentation:       Transverse, head to maternal left
Placenta:           Posterior, low-lying, 1.9cm from int os
P. Cord Insertion:  Visualized

Amniotic Fluid
AFI FV:      Subjectively within normal limits

Largest Pocket(cm)
4.9
Biometry
BPD:      51.3  mm     G. Age:  21w 4d         71  %    CI:        77.03   %   70 - 86
FL/HC:      17.5   %   15.9 -
HC:      185.1  mm     G. Age:  20w 6d         35  %    HC/AC:      1.17       1.06 -
AC:      158.4  mm     G. Age:  21w 0d         43  %    FL/BPD:     63.0   %
FL:       32.3  mm     G. Age:  20w 1d         15  %    FL/AC:      20.4   %   20 - 24
HUM:      31.3  mm     G. Age:  20w 3d         31  %
CER:      22.9  mm     G. Age:  21w 3d         64  %
LV:        5.1  mm
CM:        4.5  mm

Est. FW:     366  gm    0 lb 13 oz      36  %
Gestational Age

LMP:           22w 0d       Date:   01/21/16                 EDD:   10/27/16
Clinical EDD:  21w 0d                                        EDD:   11/03/16
U/S Today:     20w 6d                                        EDD:   11/04/16
Best:          21w 0d    Det. By:   Clinical EDD             EDD:   11/03/16
Anatomy

Cranium:               Appears normal         Aortic Arch:            Appears normal
Cavum:                 Appears normal         Ductal Arch:            Previously seen
Ventricles:            Appears normal         Diaphragm:              Appears normal
Choroid Plexus:        Appears normal         Stomach:                Appears normal, left
sided
Cerebellum:            Appears normal         Abdomen:                Appears normal
Posterior Fossa:       Appears normal         Abdominal Wall:         Appears nml (cord
insert, abd wall)
Nuchal Fold:           Not applicable (>20    Cord Vessels:           Previously seen
wks GA)
Face:                  Absent nasal bone      Kidneys:                Appear normal
Lips:                  Appears normal         Bladder:                Appears normal
Thoracic:              Appears normal         Spine:                  Previously seen
Heart:                 Appears normal         Upper Extremities:      Previously seen
(4CH, axis, and situs
RVOT:                  Previously seen        Lower Extremities:      Previously seen
LVOT:                  Appears normal

Other:  Female gender previously seen. Absent nasal bone. Open hands
visualized.
Cervix Uterus Adnexa

Cervix
Length:           3.66  cm.
Normal appearance by transabdominal scan.

Left Ovary
Not visualized.

Right Ovary
Not visualized.
Impression

Single IUP at 21w 0d
Trisomy 21 by NIPT
Absent nasal bone noted
The remainder of the fetal anatomy appears normal
Posterior, low lying placenta noted with the leading edge of
the placenta 1.9 cm from the internal os
Normal amniotic fluid volume
Recommendations

Fetal echo scheduled next week
Ultrasound for growth in 4 weeks

## 2018-07-20 ENCOUNTER — Encounter: Payer: Self-pay | Admitting: Family Medicine

## 2018-12-20 ENCOUNTER — Encounter: Payer: Self-pay | Admitting: Family Medicine

## 2019-01-03 ENCOUNTER — Encounter: Payer: Self-pay | Admitting: Family Medicine

## 2019-05-09 ENCOUNTER — Encounter: Payer: Self-pay | Admitting: Family Medicine

## 2019-08-01 ENCOUNTER — Encounter: Payer: Self-pay | Admitting: Certified Nurse Midwife

## 2019-08-01 ENCOUNTER — Other Ambulatory Visit: Payer: Self-pay

## 2019-08-01 ENCOUNTER — Ambulatory Visit (INDEPENDENT_AMBULATORY_CARE_PROVIDER_SITE_OTHER): Payer: BC Managed Care – PPO | Admitting: Certified Nurse Midwife

## 2019-08-01 VITALS — BP 133/84 | HR 83 | Ht 62.0 in | Wt 187.0 lb

## 2019-08-01 DIAGNOSIS — R309 Painful micturition, unspecified: Secondary | ICD-10-CM | POA: Diagnosis not present

## 2019-08-01 DIAGNOSIS — N939 Abnormal uterine and vaginal bleeding, unspecified: Secondary | ICD-10-CM | POA: Diagnosis not present

## 2019-08-01 DIAGNOSIS — Z124 Encounter for screening for malignant neoplasm of cervix: Secondary | ICD-10-CM | POA: Diagnosis not present

## 2019-08-01 DIAGNOSIS — N814 Uterovaginal prolapse, unspecified: Secondary | ICD-10-CM | POA: Diagnosis not present

## 2019-08-01 DIAGNOSIS — Z1151 Encounter for screening for human papillomavirus (HPV): Secondary | ICD-10-CM

## 2019-08-01 DIAGNOSIS — Z113 Encounter for screening for infections with a predominantly sexual mode of transmission: Secondary | ICD-10-CM | POA: Diagnosis not present

## 2019-08-01 NOTE — Progress Notes (Signed)
GYNECOLOGY ANNUAL PREVENTATIVE CARE ENCOUNTER NOTE  History:     Courtney Costa is a 43 y.o. [phone] female here for a routine annual gynecologic exam.  Current complaints: irregular bleeding since October and "bulge in vagina".   Denies abnormal discharge, urinary symptoms or other gynecologic concerns.  She reports painful intercourse over the past 1-2 months. She also request pap smear today.   Gynecologic History Patient's last menstrual period was 07/29/2019. Contraception: none Last Pap: 04/2016. Results were: normal with negative HPV   Obstetric History OB History  Gravida Para Term Preterm AB Living  3 3 3     3   SAB TAB Ectopic Multiple Live Births        0 1    # Outcome Date GA Lbr Len/2nd Weight Sex Delivery Anes PTL Lv  3 Term 10/30/16 [redacted]w[redacted]d 00:42 / 00:13 6 lb 9 oz (2.977 kg) F Vag-Spont EPI  LIV     Birth Comments: Trisomy 21  2 Term 12/29/06   8 lb 4 oz (3.742 kg) M Vag-Spont     1 Term 2004   8 lb 4 oz (3.742 kg) M Vag-Spont       Past Medical History:  Diagnosis Date  . Spondylisthesis 05/16/2012  . Spondylisthesis 05/16/2012    Past Surgical History:  Procedure Laterality Date  . BREAST REDUCTION SURGERY  08/2014    Current Outpatient Medications on File Prior to Visit  Medication Sig Dispense Refill  . phentermine (ADIPEX-P) 37.5 MG tablet Take 37.5 mg by mouth daily.    Marland Kitchen azithromycin (ZITHROMAX Z-PAK) 250 MG tablet Take 2 tablets (500 mg) on  Day 1,  followed by 1 tablet (250 mg) once daily on Days 2 through 5. (Patient not taking: Reported on 08/01/2019) 6 tablet 0  . chlorpheniramine-HYDROcodone (TUSSIONEX) 10-8 MG/5ML SUER Take 5 mLs by mouth every 12 (twelve) hours as needed for cough (cough, will cause drowsiness.). (Patient not taking: Reported on 08/01/2019) 120 mL 0  . clobetasol cream (TEMOVATE) 2.99 % Apply 1 application topically 2 (two) times daily. (Patient not taking: Reported on 08/01/2019) 60 g 2  . fluticasone (FLONASE) 50  MCG/ACT nasal spray One spray in each nostril twice a day, use left hand for right nostril, and right hand for left nostril. (Patient not taking: Reported on 08/01/2019) 48 g 3  . Prenatal Vit-Fe Fumarate-FA (MULTIVITAMIN-PRENATAL) 27-0.8 MG TABS tablet Take 1 tablet by mouth daily at 12 noon. (Patient not taking: Reported on 08/01/2019) 30 each 6   No current facility-administered medications on file prior to visit.     Allergies  Allergen Reactions  . Vagisil [Benzocaine-Resorcinol] Swelling    Social History:  reports that she has never smoked. She has never used smokeless tobacco. She reports that she does not drink alcohol or use drugs.  Family History  Problem Relation Age of Onset  . Hypertension Father   . Psoriasis Father   . Skin cancer Other   . Breast cancer Neg Hx     The following portions of the patient's history were reviewed and updated as appropriate: allergies, current medications, past family history, past medical history, past social history, past surgical history and problem list.  Review of Systems Pertinent items noted in HPI and remainder of comprehensive ROS otherwise negative.  Physical Exam:  BP 133/84   Pulse 83   Ht 5\' 2"  (1.575 m)   Wt 187 lb (84.8 kg)   LMP 07/29/2019   BMI 34.20 kg/m  CONSTITUTIONAL: Well-developed, well-nourished female in no acute distress.  HENT:  Normocephalic, atraumatic, External right and left ear normal. Oropharynx is clear and moist EYES: Conjunctivae and EOM are normal. Pupils are equal, round, and reactive to light. NECK: Normal range of motion, supple, no masses.  Normal thyroid.  SKIN: Skin is warm and dry. No rash noted. Not diaphoretic. No erythema. No pallor. MUSCULOSKELETAL: Normal range of motion. No tenderness.  No cyanosis, clubbing, or edema.  2+ distal pulses. NEUROLOGIC: Alert and oriented to person, place, and time. Normal reflexes, muscle tone coordination.  PSYCHIATRIC: Normal mood and affect. Normal  behavior. Normal judgment and thought content. CARDIOVASCULAR: Normal heart rate noted, regular rhythm RESPIRATORY: Clear to auscultation bilaterally. Effort and breath sounds normal, no problems with respiration noted. BREASTS: Symmetric in size. No masses, tenderness, skin changes, nipple drainage, or lymphadenopathy bilaterally. ABDOMEN: Soft, no distention noted.  No tenderness, rebound or guarding.  PELVIC: Normal appearing external genitalia and urethral meatus; normal appearing vaginal mucosa and cervix.  Small amount of dark red vaginal bleeding without clots present.  Pap smear obtained.  Slightly enlarged uterus, no other palpable masses, no uterine or adnexal tenderness. Kegal and vagal exercises performed. 6cm cystocele noted during vagal.    Assessment and Plan:    1. Abnormal vaginal bleeding - Patient reports since October having heavy cycles and spotting occur every 2 weeks  - Not currently on any birth control  - Patient reports fam hx of menopause around mid 69s  - Reports dysmenorrhea and dyspareunia  - Cytology - PAP( [facility]) - US Pelvis Complete; Future  2. Cystocele with prolapse - 6cm cystocele noted with vagal  - Educated and discussed options of PT, pessary, or surgery for treatment - Urine Culture  Will follow up results of pap smear and manage accordingly. Ultrasound scheduled Will follow up after ultrasound to determine best plan of care- discussed if ultrasound normal will discuss IUD for AUB and possible sling or pessary for cystocele      Sharyon Cable, CNM [facility] [employer], [facility]

## 2019-08-01 NOTE — Patient Instructions (Addendum)
Pelvic Organ Prolapse Pelvic organ prolapse is the stretching, bulging, or dropping of pelvic organs into an abnormal position. It happens when the muscles and tissues that surround and support pelvic structures become weak or stretched. Pelvic organ prolapse can involve the:  Vagina (vaginal prolapse).  Uterus (uterine prolapse).  Bladder (cystocele).  Rectum (rectocele).  Intestines (enterocele). When organs other than the vagina are involved, they often bulge into the vagina or protrude from the vagina, depending on how severe the prolapse is. What are the causes? This condition may be caused by:  Pregnancy, labor, and childbirth.  Past pelvic surgery.  Decreased production of the hormone estrogen associated with menopause.  Consistently lifting more than 50 lb (23 kg).  Obesity.  Long-term inability to pass stool (chronic constipation).  A cough that lasts a long time (chronic).  Buildup of fluid in the abdomen due to certain diseases and other conditions. What are the signs or symptoms? Symptoms of this condition include:  Passing a little urine (loss of bladder control) when you cough, sneeze, strain, and exercise (stress incontinence). This may be worse immediately after childbirth. It may gradually improve over time.  Feeling pressure in your pelvis or vagina. This pressure may increase when you cough or when you are passing stool.  A bulge that protrudes from the opening of your vagina.  Difficulty passing urine or stool.  Pain in your lower back.  Pain, discomfort, or disinterest in sex.  Repeated bladder infections (urinary tract infections).  Difficulty inserting a tampon. In some people, this condition causes no symptoms. How is this diagnosed? This condition may be diagnosed based on a vaginal and rectal exam. During the exam, you may be asked to cough and strain while you are lying down, sitting, and standing up. Your health care provider will  determine if other tests are required, such as bladder function tests. How is this treated? Treatment for this condition may depend on your symptoms. Treatment may include:  Lifestyle changes, such as changes to your diet.  Emptying your bladder at scheduled times (bladder training therapy). This can help reduce or avoid urinary incontinence.  Estrogen. Estrogen may help mild prolapse by increasing the strength and tone of pelvic floor muscles.  Kegel exercises. These may help mild cases of prolapse by strengthening and tightening the muscles of the pelvic floor.  A soft, flexible device that helps support the vaginal walls and keep pelvic organs in place (pessary). This is inserted into your vagina by your health care provider.  Surgery. This is often the only form of treatment for severe prolapse. Follow these instructions at home:  Avoid drinking beverages that contain caffeine or alcohol.  Increase your intake of high-fiber foods. This can help decrease constipation and straining during bowel movements.  Lose weight if recommended by your health care provider.  Wear a sanitary pad or adult diapers if you have urinary incontinence.  Avoid heavy lifting and straining with exercise and work. Do not hold your breath when you perform mild to moderate lifting and exercise activities. Limit your activities as directed by your health care provider.  Do Kegel exercises as directed by your health care provider. To do this: ? Squeeze your pelvic floor muscles tight. You should feel a tight lift in your rectal area and a tightness in your vaginal area. Keep your stomach, buttocks, and legs relaxed. ? Hold the muscles tight for up to 10 seconds. ? Relax your muscles. ? Repeat this exercise 50 times a day,   or as many times as told by your health care provider. Continue to do this exercise for at least 4-6 weeks, or for as long as told by your health care provider.  Take over-the-counter and  prescription medicines only as told by your health care provider.  If you have a pessary, take care of it as told by your health care provider.  Keep all follow-up visits as told by your health care provider. This is important. Contact a health care provider if you:  Have symptoms that interfere with your daily activities or sex life.  Need medicine to help with the discomfort.  Notice bleeding from your vagina that is not related to your period.  Have a fever.  Have pain or bleeding when you urinate.  Have bleeding when you pass stool.  Pass urine when you have sex.  Have chronic constipation.  Have a pessary that falls out.  Have bad smelling vaginal discharge.  Have an unusual, low pain in your abdomen. Summary  Pelvic organ prolapse is the stretching, bulging, or dropping of pelvic organs into an abnormal position. It happens when the muscles and tissues that surround and support pelvic structures become weak or stretched.  When organs other than the vagina are involved, they often bulge into the vagina or protrude from the vagina, depending on how severe the prolapse is.  In most cases, this condition needs to be treated only if it produces symptoms. Treatment may include lifestyle changes, estrogen, Kegel exercises, pessary insertion, or surgery.  Avoid heavy lifting and straining with exercise and work. Do not hold your breath when you perform mild to moderate lifting and exercise activities. Limit your activities as directed by your health care provider. This information is not intended to replace advice given to you by your health care provider. Make sure you discuss any questions you have with your health care provider. Document Released: 03/21/2014 Document Revised: 09/15/2017 Document Reviewed: 09/15/2017 Elsevier Patient Education  2020 Elsevier Inc.   Perimenopause  Perimenopause is the normal time of life before and after menstrual periods stop completely  (menopause). Perimenopause can begin 2-8 years before menopause, and it usually lasts for 1 year after menopause. During perimenopause, the ovaries may or may not produce an egg. What are the causes? This condition is caused by a natural change in hormone levels that happens as you get older. What increases the risk? This condition is more likely to start at an earlier age if you have certain medical conditions or treatments, including:  A tumor of the pituitary gland in the brain.  A disease that affects the ovaries and hormone production.  Radiation treatment for cancer.  Certain cancer treatments, such as chemotherapy or hormone (anti-estrogen) therapy.  Heavy smoking and excessive alcohol use.  Family history of early menopause. What are the signs or symptoms? Perimenopausal changes affect each woman differently. Symptoms of this condition may include:  Hot flashes.  Night sweats.  Irregular menstrual periods.  Decreased sex drive.  Vaginal dryness.  Headaches.  Mood swings.  Depression.  Memory problems or trouble concentrating.  Irritability.  Tiredness.  Weight gain.  Anxiety.  Trouble getting pregnant. How is this diagnosed? This condition is diagnosed based on your medical history, a physical exam, your age, your menstrual history, and your symptoms. Hormone tests may also be done. How is this treated? In some cases, no treatment is needed. You and your health care provider should make a decision together about whether treatment is necessary. Treatment   will be based on your individual condition and preferences. Various treatments are available, such as:  Menopausal hormone therapy (MHT).  Medicines to treat specific symptoms.  Acupuncture.  Vitamin or herbal supplements. Before starting treatment, make sure to let your health care provider know if you have a personal or family history of:  Heart disease.  Breast cancer.  Blood clots.   Diabetes.  Osteoporosis. Follow these instructions at home: Lifestyle  Do not use any products that contain nicotine or tobacco, such as cigarettes and e-cigarettes. If you need help quitting, ask your health care provider.  Eat a balanced diet that includes fresh fruits and vegetables, whole grains, soybeans, eggs, lean meat, and low-fat dairy.  Get at least 30 minutes of physical activity on 5 or more days each week.  Avoid alcoholic and caffeinated beverages, as well as spicy foods. This may help prevent hot flashes.  Get 7-8 hours of sleep each night.  Dress in layers that can be removed to help you manage hot flashes.  Find ways to manage stress, such as deep breathing, meditation, or journaling. General instructions  Keep track of your menstrual periods, including: ? When they occur. ? How heavy they are and how long they last. ? How much time passes between periods.  Keep track of your symptoms, noting when they start, how often you have them, and how long they last.  Take over-the-counter and prescription medicines only as told by your health care provider.  Take vitamin supplements only as told by your health care provider. These may include calcium, vitamin E, and vitamin D.  Use vaginal lubricants or moisturizers to help with vaginal dryness and improve comfort during sex.  Talk with your health care provider before starting any herbal supplements.  Keep all follow-up visits as told by your health care provider. This is important. This includes any group therapy or counseling. Contact a health care provider if:  You have heavy vaginal bleeding or pass blood clots.  Your period lasts more than 2 days longer than normal.  Your periods are recurring sooner than 21 days.  You bleed after having sex. Get help right away if:  You have chest pain, trouble breathing, or trouble talking.  You have severe depression.  You have pain when you urinate.  You have  severe headaches.  You have vision problems. Summary  Perimenopause is the time when a woman's body begins to move into menopause. This may happen naturally or as a result of other health problems or medical treatments.  Perimenopause can begin 2-8 years before menopause, and it usually lasts for 1 year after menopause.  Perimenopausal symptoms can be managed through medicines, lifestyle changes, and complementary therapies such as acupuncture. This information is not intended to replace advice given to you by your health care provider. Make sure you discuss any questions you have with your health care provider. Document Released: 10/01/2004 Document Revised: 08/06/2017 Document Reviewed: 09/29/2016 Elsevier Patient Education  2020 Elsevier Inc.  

## 2019-08-02 ENCOUNTER — Ambulatory Visit (INDEPENDENT_AMBULATORY_CARE_PROVIDER_SITE_OTHER): Payer: BC Managed Care – PPO

## 2019-08-02 DIAGNOSIS — N83291 Other ovarian cyst, right side: Secondary | ICD-10-CM | POA: Diagnosis not present

## 2019-08-02 DIAGNOSIS — N939 Abnormal uterine and vaginal bleeding, unspecified: Secondary | ICD-10-CM | POA: Diagnosis not present

## 2019-08-03 LAB — URINE CULTURE
MICRO NUMBER:: 1135559
SPECIMEN QUALITY:: ADEQUATE

## 2019-08-04 LAB — CYTOLOGY - PAP
Chlamydia: NEGATIVE
Comment: NEGATIVE
Comment: NEGATIVE
Comment: NORMAL
Diagnosis: NEGATIVE
High risk HPV: NEGATIVE
Neisseria Gonorrhea: NEGATIVE

## 2019-08-10 ENCOUNTER — Ambulatory Visit: Payer: BC Managed Care – PPO | Admitting: Obstetrics and Gynecology

## 2019-08-14 ENCOUNTER — Telehealth: Payer: Self-pay | Admitting: *Deleted

## 2019-08-14 ENCOUNTER — Ambulatory Visit: Payer: BC Managed Care – PPO | Admitting: Obstetrics & Gynecology

## 2019-08-14 NOTE — Telephone Encounter (Signed)
Left patient an urgent message to call the office before her appointment today at 4:15 with a 4:00pm arrival. Needs to schedule with Ervin at clinic or [place].

## 2019-09-13 ENCOUNTER — Ambulatory Visit: Payer: BC Managed Care – PPO | Admitting: Obstetrics and Gynecology

## 2019-09-13 ENCOUNTER — Encounter: Payer: Self-pay | Admitting: Obstetrics and Gynecology

## 2019-09-13 ENCOUNTER — Other Ambulatory Visit: Payer: Self-pay

## 2019-09-13 DIAGNOSIS — N393 Stress incontinence (female) (male): Secondary | ICD-10-CM | POA: Diagnosis not present

## 2019-09-13 DIAGNOSIS — N8111 Cystocele, midline: Secondary | ICD-10-CM

## 2019-09-13 DIAGNOSIS — N920 Excessive and frequent menstruation with regular cycle: Secondary | ICD-10-CM | POA: Diagnosis not present

## 2019-09-13 NOTE — Progress Notes (Signed)
Patient ID: Courtney Costa, female   DOB: 05-19-1976, 44 y.o.   MRN: [id number] Courtney Costa presents for eval of heavy cycles and cystocele. Pt reports reg heavy cycles for the last few months. U/S normal. Cylces last 7 days, changes pads 4-6 times a day. Has SUI Sx as well. Wears a pad for accidents.  She has noticed some vaginal pressure and vaginal "bulge" for the last few months as well.  Sexual active without problems No contraception TSVD x 3 largest 8 + pounds Pap 11/20, normal  PE AF VSS Lungs clear Heart RRR Abd soft + BS GU Nl EGBUS, midline Grade cystocele, cervix no lesion, uterus @ 10 weeks, mobile non tender, no adnexal masses.  A/P Menorrhagia        Pelvic relaxation        SUI Tx options from conservative, medical and surgery reviewed with pt. Including PT and pessay. Pt uncertain at this time. Will think over and call back with choice. Will proceed them.

## 2019-09-13 NOTE — Progress Notes (Signed)
Pt is here for F/U for heavy cycles and cystocele. [place] on 08/02/19. Pt reports that she is still experiencing heavy cycles and would like to know what options there are to alleviate her symptoms.

## 2019-11-14 ENCOUNTER — Other Ambulatory Visit: Payer: Self-pay

## 2019-11-14 ENCOUNTER — Ambulatory Visit (INDEPENDENT_AMBULATORY_CARE_PROVIDER_SITE_OTHER): Payer: BC Managed Care – PPO | Admitting: Sports Medicine

## 2019-11-14 ENCOUNTER — Ambulatory Visit (INDEPENDENT_AMBULATORY_CARE_PROVIDER_SITE_OTHER): Payer: BC Managed Care – PPO

## 2019-11-14 ENCOUNTER — Encounter: Payer: Self-pay | Admitting: Sports Medicine

## 2019-11-14 DIAGNOSIS — M25532 Pain in left wrist: Secondary | ICD-10-CM

## 2019-11-14 DIAGNOSIS — M25531 Pain in right wrist: Secondary | ICD-10-CM | POA: Insufficient documentation

## 2019-11-14 MED ORDER — MELOXICAM 15 MG PO TABS: ORAL_TABLET | ORAL | 3 refills | Status: DC

## 2019-11-14 NOTE — Assessment & Plan Note (Signed)
Courtney Costa is a pleasant 44 year old female, for several months she is had bilateral wrist pain, occasional clicking and locking. She has pain mostly in the right wrist today, referrable to the [facility]. We will start conservatively, x-rays, meloxicam, right-sided Velcro wrist brace to wear at night, she has 1 for the left side already. Rehab exercises given. Return to see me in 1 month, MR arthrogram if no better.

## 2019-11-14 NOTE — Progress Notes (Signed)
    Procedures performed today:    None.  Independent interpretation of notes and tests performed by another provider:   None.  Impression and Recommendations:    Bilateral wrist pain Courtney Costa is a pleasant 44 year old female, for several months she is had bilateral wrist pain, occasional clicking and locking. She has pain mostly in the right wrist today, referrable to the [facility]. We will start conservatively, x-rays, meloxicam, right-sided Velcro wrist brace to wear at night, she has 1 for the left side already. Rehab exercises given. Return to see me in 1 month, MR arthrogram if no better.    ___________________________________________ Ihor Austin. Benjamin Stain, M.D., ABFM., CAQSM. Primary Care and Sports Medicine [facility]  Adjunct Instructor of Family Medicine  [employer]

## 2019-11-22 ENCOUNTER — Encounter: Payer: Self-pay | Admitting: Family Medicine

## 2019-11-22 ENCOUNTER — Ambulatory Visit (INDEPENDENT_AMBULATORY_CARE_PROVIDER_SITE_OTHER): Payer: BC Managed Care – PPO | Admitting: Family Medicine

## 2019-11-22 ENCOUNTER — Other Ambulatory Visit: Payer: Self-pay

## 2019-11-22 VITALS — BP 142/84 | HR 84 | Temp 98.1°F | Ht 61.0 in | Wt 186.0 lb

## 2019-11-22 DIAGNOSIS — Z13220 Encounter for screening for lipoid disorders: Secondary | ICD-10-CM | POA: Diagnosis not present

## 2019-11-22 DIAGNOSIS — Z0000 Encounter for general adult medical examination without abnormal findings: Secondary | ICD-10-CM

## 2019-11-22 DIAGNOSIS — F430 Acute stress reaction: Secondary | ICD-10-CM

## 2019-11-22 DIAGNOSIS — F411 Generalized anxiety disorder: Secondary | ICD-10-CM

## 2019-11-22 NOTE — Progress Notes (Signed)
Courtney Costa - 44 y.o. female MRN [id number]  Date of birth: Jan 09, 1976  Subjective Chief Complaint  Patient presents with  . Annual Exam    HPI Courtney Costa is a 44 y.o. female here today for annual exam.  She has history of depression and anxiety.  She was also recently diagnosed with cystocele.  She is considering having surgery to correct this.  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  She has previously taken citalopram to help with this.  Not at a point where she feels like she needs to start medication again.   She admits that diet could be better.  Often tired by the end of the day so she will end up ordering take out.  She does try to walk some for exercise.   She is a non-smoker and denies EtOH use.   She has GYN and is up to date on PAP.   Depression screen PHQ 2/9 11/22/2019  Decreased Interest 0  Down, Depressed, Hopeless 1  PHQ - 2 Score 1  Altered sleeping 2  Tired, decreased energy 2  Change in appetite 3  Feeling bad or failure about yourself  2  Trouble concentrating 0  Moving slowly or fidgety/restless 2  Suicidal thoughts 0  PHQ-9 Score 12  Difficult doing work/chores Somewhat difficult   GAD 7 : Generalized Anxiety Score 11/22/2019  Nervous, Anxious, on Edge 1  Control/stop worrying 2  Worry too much - different things 2  Trouble relaxing 2  Restless 1  Easily annoyed or irritable 3  Afraid - awful might happen 1  Total GAD 7 Score 12  Anxiety Difficulty Somewhat difficult    Review of Systems  Constitutional: Negative for chills, fever, malaise/fatigue and weight loss.  HENT: Negative for congestion, ear pain and sore throat.   Eyes: Negative for blurred vision, double vision and pain.  Respiratory: Negative for cough and shortness of breath.   Cardiovascular: Negative for chest  pain and palpitations.  Gastrointestinal: Negative for abdominal pain, blood in stool, constipation, heartburn and nausea.  Genitourinary: Negative for dysuria and urgency.  Musculoskeletal: Negative for joint pain and myalgias.  Neurological: Negative for dizziness and headaches.  Endo/Heme/Allergies: Does not bruise/bleed easily.  Psychiatric/Behavioral: Negative for depression. The patient is nervous/anxious. The patient does not have insomnia.     Allergies  Allergen Reactions  . Vagisil [Benzocaine-Resorcinol] Swelling    Past Medical History:  Diagnosis Date  . Spondylisthesis 05/16/2012  . Spondylisthesis 05/16/2012    Past Surgical History:  Procedure Laterality Date  . BREAST REDUCTION SURGERY  08/2014    Social History   Socioeconomic History  . Marital status: Married    Spouse name: Not on file  . Number of children: Not on file  . Years of education: Not on file  . Highest education level: Not on file  Occupational History  . Not on file  Tobacco Use  . Smoking status: Never Smoker  . Smokeless tobacco: Never Used  Substance and Sexual Activity  . Alcohol use: No  . Drug use: No  . Sexual activity: Yes    Birth control/protection: None  Other Topics Concern  . Not on file  Social History Narrative  . Not on file   Social Determinants of Health   Financial Resource Strain:   . Difficulty of Paying Living  Expenses:   Food Insecurity:   . Worried About Programme researcher, broadcasting/film/video in the Last Year:   . Barista in the Last Year:   Transportation Needs:   . Freight forwarder (Medical):   Marland Kitchen Lack of Transportation (Non-Medical):   Physical Activity:   . Days of Exercise per Week:   . Minutes of Exercise per Session:   Stress:   . Feeling of Stress :   Social Connections:   . Frequency of Communication with Friends and Family:   . Frequency of Social Gatherings with Friends and Family:   . Attends Religious Services:   . Active Member of Clubs  or Organizations:   . Attends Banker Meetings:   Marland Kitchen Marital Status:     Family History  Problem Relation Age of Onset  . Hypertension Father   . Psoriasis Father   . Skin cancer Other   . Breast cancer Neg Hx     Health Maintenance  Topic Date Due  . INFLUENZA VACCINE  12/06/2019 (Originally 04/08/2019)  . PAP SMEAR-Modifier  07/31/2022  . TETANUS/TDAP  08/14/2026  . HIV Screening  Completed     ----------------------------------------------------------------------------------------------------------------------------------------------------------------------------------------------------------------- Physical Exam BP (!) 142/84   Pulse 84   Temp 98.1 F (36.7 C) (Oral)   Ht 5\' 1"  (1.549 m)   Wt 186 lb (84.4 kg)   LMP 11/15/2019   BMI 35.14 kg/m   Physical Exam Constitutional:      General: She is not in acute distress. HENT:     Head: Normocephalic and atraumatic.     Nose: Nose normal.  Eyes:     General: No scleral icterus.    Conjunctiva/sclera: Conjunctivae normal.  Neck:     Thyroid: No thyromegaly.  Cardiovascular:     Rate and Rhythm: Normal rate and regular rhythm.     Heart sounds: Normal heart sounds.  Pulmonary:     Effort: Pulmonary effort is normal.     Breath sounds: Normal breath sounds.  Abdominal:     General: Bowel sounds are normal. There is no distension.     Palpations: Abdomen is soft.     Tenderness: There is no abdominal tenderness. There is no guarding.  Musculoskeletal:        General: Normal range of motion.     Cervical back: Normal range of motion and neck supple.  Lymphadenopathy:     Cervical: No cervical adenopathy.  Skin:    General: Skin is warm and dry.     Findings: No rash.  Neurological:     Mental Status: She is alert and oriented to person, place, and time.     Cranial Nerves: No cranial nerve deficit.     Coordination: Coordination normal.  Psychiatric:        Mood and Affect: Mood normal.         Behavior: Behavior normal.     ------------------------------------------------------------------------------------------------------------------------------------------------------------------------------------------------------------------- Assessment and Plan  Anxiety as acute reaction to exceptional stress Continues with anxiety, managing ok for now.  She would like to hold off on medications for now.   Well adult exam Well adult Orders Placed This Encounter  Procedures  . COMPLETE METABOLIC PANEL WITH GFR  . CBC  . Lipid Profile  . TSH  . Vitamin D (25 hydroxy)  Screening: lipid Immunizations: UTD Anticipatory guidance/Risk factor reduction:  Discussed working on increasing activity and making changes to diet. Additional recommendations per AVS   No orders of the defined types  were placed in this encounter.   No follow-ups on file.    This visit occurred during the SARS-CoV-2 public health emergency.  Safety protocols were in place, including screening questions prior to the visit, additional usage of staff PPE, and extensive cleaning of exam room while observing appropriate contact time as indicated for disinfecting solutions.

## 2019-11-22 NOTE — Assessment & Plan Note (Signed)
Continues with anxiety, managing ok for now.  She would like to hold off on medications for now.

## 2019-11-22 NOTE — Assessment & Plan Note (Signed)
Well adult Orders Placed This Encounter  Procedures  . COMPLETE METABOLIC PANEL WITH GFR  . CBC  . Lipid Profile  . TSH  . Vitamin D (25 hydroxy)  Screening: lipid Immunizations: UTD Anticipatory guidance/Risk factor reduction:  Discussed working on increasing activity and making changes to diet. Additional recommendations per AVS

## 2019-11-22 NOTE — Patient Instructions (Signed)
Preventive Care 21-46 Years Old, Female Preventive care refers to visits with your health care provider and lifestyle choices that can promote health and wellness. This includes:  A yearly physical exam. This may also be called an annual well check.  Regular dental visits and eye exams.  Immunizations.  Screening for certain conditions.  Healthy lifestyle choices, such as eating a healthy diet, getting regular exercise, not using drugs or products that contain nicotine and tobacco, and limiting alcohol use. What can I expect for my preventive care visit? Physical exam Your health care provider will check your:  Height and weight. This may be used to calculate body mass index (BMI), which tells if you are at a healthy weight.  Heart rate and blood pressure.  Skin for abnormal spots. Counseling Your health care provider may ask you questions about your:  Alcohol, tobacco, and drug use.  Emotional well-being.  Home and relationship well-being.  Sexual activity.  Eating habits.  Work and work environment.  Method of birth control.  Menstrual cycle.  Pregnancy history. What immunizations do I need?  Influenza (flu) vaccine  This is recommended every year. Tetanus, diphtheria, and pertussis (Tdap) vaccine  You may need a Td booster every 10 years. Varicella (chickenpox) vaccine  You may need this if you have not been vaccinated. Human papillomavirus (HPV) vaccine  If recommended by your health care provider, you may need three doses over 6 months. Measles, mumps, and rubella (MMR) vaccine  You may need at least one dose of MMR. You may also need a second dose. Meningococcal conjugate (MenACWY) vaccine  One dose is recommended if you are age 19-21 years and a first-year college student living in a residence hall, or if you have one of several medical conditions. You may also need additional booster doses. Pneumococcal conjugate (PCV13) vaccine  You may need  this if you have certain conditions and were not previously vaccinated. Pneumococcal polysaccharide (PPSV23) vaccine  You may need one or two doses if you smoke cigarettes or if you have certain conditions. Hepatitis A vaccine  You may need this if you have certain conditions or if you travel or work in places where you may be exposed to hepatitis A. Hepatitis B vaccine  You may need this if you have certain conditions or if you travel or work in places where you may be exposed to hepatitis B. Haemophilus influenzae type b (Hib) vaccine  You may need this if you have certain conditions. You may receive vaccines as individual doses or as more than one vaccine together in one shot (combination vaccines). Talk with your health care provider about the risks and benefits of combination vaccines. What tests do I need?  Blood tests  Lipid and cholesterol levels. These may be checked every 5 years starting at age 20.  Hepatitis C test.  Hepatitis B test. Screening  Diabetes screening. This is done by checking your blood sugar (glucose) after you have not eaten for a while (fasting).  Sexually transmitted disease (STD) testing.  BRCA-related cancer screening. This may be done if you have a family history of breast, ovarian, tubal, or peritoneal cancers.  Pelvic exam and Pap test. This may be done every 3 years starting at age 21. Starting at age 30, this may be done every 5 years if you have a Pap test in combination with an HPV test. Talk with your health care provider about your test results, treatment options, and if necessary, the need for more tests.   Follow these instructions at home: Eating and drinking   Eat a diet that includes fresh fruits and vegetables, whole grains, lean protein, and low-fat dairy.  Take vitamin and mineral supplements as recommended by your health care provider.  Do not drink alcohol if: ? Your health care provider tells you not to drink. ? You are  pregnant, may be pregnant, or are planning to become pregnant.  If you drink alcohol: ? Limit how much you have to 0-1 drink a day. ? Be aware of how much alcohol is in your drink. In the [place] one drink equals one 12 oz bottle of beer (355 mL), one 5 oz glass of wine (148 mL), or one 1 oz glass of hard liquor (44 mL). Lifestyle  Take daily care of your teeth and gums.  Stay active. Exercise for at least 30 minutes on 5 or more days each week.  Do not use any products that contain nicotine or tobacco, such as cigarettes, e-cigarettes, and chewing tobacco. If you need help quitting, ask your health care provider.  If you are sexually active, practice safe sex. Use a condom or other form of birth control (contraception) in order to prevent pregnancy and STIs (sexually transmitted infections). If you plan to become pregnant, see your health care provider for a preconception visit. What's next?  Visit your health care provider once a year for a well check visit.  Ask your health care provider how often you should have your eyes and teeth checked.  Stay up to date on all vaccines. This information is not intended to replace advice given to you by your health care provider. Make sure you discuss any questions you have with your health care provider. Document Revised: 05/05/2018 Document Reviewed: 05/05/2018 Elsevier Patient Education  2020 [employer].

## 2019-11-23 LAB — CBC
HCT: 44.6 % (ref 35.0–45.0)
Hemoglobin: 14.7 g/dL (ref 11.7–15.5)
MCH: 28.3 pg (ref 27.0–33.0)
MCHC: 33 g/dL (ref 32.0–36.0)
MCV: 85.9 fL (ref 80.0–100.0)
MPV: 11.3 fL (ref 7.5–12.5)
Platelets: 250 10*3/uL (ref 140–400)
RBC: 5.19 10*6/uL — ABNORMAL HIGH (ref 3.80–5.10)
RDW: 12.6 % (ref 11.0–15.0)
WBC: 6 10*3/uL (ref 3.8–10.8)

## 2019-11-23 LAB — LIPID PANEL
Cholesterol: 212 mg/dL — ABNORMAL HIGH (ref <200)
HDL: 64 mg/dL (ref >=50)
LDL Cholesterol (Calc): 122 mg/dL (calc) — ABNORMAL HIGH
Non-HDL Cholesterol (Calc): 148 mg/dL (calc) — ABNORMAL HIGH (ref <130)
Total CHOL/HDL Ratio: 3.3 (calc) (ref <5.0)
Triglycerides: 148 mg/dL (ref <150)

## 2019-11-23 LAB — COMPLETE METABOLIC PANEL WITH GFR
AG Ratio: 1.5 (calc) (ref 1.0–2.5)
ALT: 18 U/L (ref 6–29)
AST: 16 U/L (ref 10–35)
Albumin: 4.3 g/dL (ref 3.6–5.1)
Alkaline phosphatase (APISO): 72 U/L (ref 31–125)
BUN: 14 mg/dL (ref 7–25)
CO2: 30 mmol/L (ref 20–32)
Calcium: 10 mg/dL (ref 8.6–10.2)
Chloride: 101 mmol/L (ref 98–110)
Creat: 0.65 mg/dL (ref 0.50–1.10)
GFR, Est African American: 124 mL/min/{1.73_m2} (ref >=60)
GFR, Est Non African American: 107 mL/min/{1.73_m2} (ref >=60)
Globulin: 2.8 g/dL (calc) (ref 1.9–3.7)
Glucose, Bld: 88 mg/dL (ref 65–139)
Potassium: 4.3 mmol/L (ref 3.5–5.3)
Sodium: 138 mmol/L (ref 135–146)
Total Bilirubin: 0.5 mg/dL (ref 0.2–1.2)
Total Protein: 7.1 g/dL (ref 6.1–8.1)

## 2019-11-23 LAB — VITAMIN D 25 HYDROXY (VIT D DEFICIENCY, FRACTURES): Vit D, 25-Hydroxy: 16 ng/mL — ABNORMAL LOW (ref 30–100)

## 2019-11-23 LAB — TSH: TSH: 3.73 mIU/L

## 2019-11-27 ENCOUNTER — Other Ambulatory Visit (HOSPITAL_BASED_OUTPATIENT_CLINIC_OR_DEPARTMENT_OTHER): Payer: Self-pay | Admitting: Obstetrics & Gynecology

## 2019-11-27 DIAGNOSIS — Z1231 Encounter for screening mammogram for malignant neoplasm of breast: Secondary | ICD-10-CM

## 2019-11-29 ENCOUNTER — Other Ambulatory Visit: Payer: Self-pay

## 2019-11-29 ENCOUNTER — Ambulatory Visit (INDEPENDENT_AMBULATORY_CARE_PROVIDER_SITE_OTHER): Payer: BC Managed Care – PPO

## 2019-11-29 DIAGNOSIS — Z1231 Encounter for screening mammogram for malignant neoplasm of breast: Secondary | ICD-10-CM | POA: Diagnosis not present

## 2020-07-13 ENCOUNTER — Emergency Department: Admit: 2020-07-13 | Discharge status: Against Medical Advice | Payer: Self-pay

## 2020-07-13 DIAGNOSIS — J069 Acute upper respiratory infection, unspecified: Secondary | ICD-10-CM | POA: Diagnosis not present

## 2020-07-13 DIAGNOSIS — K1379 Other lesions of oral mucosa: Secondary | ICD-10-CM | POA: Diagnosis not present

## 2020-09-19 DIAGNOSIS — Z20822 Contact with and (suspected) exposure to covid-19: Secondary | ICD-10-CM | POA: Diagnosis not present

## 2020-09-24 ENCOUNTER — Encounter: Payer: Self-pay | Admitting: Family Medicine

## 2020-09-25 DIAGNOSIS — J069 Acute upper respiratory infection, unspecified: Secondary | ICD-10-CM | POA: Diagnosis not present

## 2020-09-25 DIAGNOSIS — U071 COVID-19: Secondary | ICD-10-CM | POA: Diagnosis not present

## 2020-10-17 ENCOUNTER — Other Ambulatory Visit: Payer: Self-pay

## 2020-10-17 ENCOUNTER — Ambulatory Visit: Payer: BC Managed Care – PPO | Admitting: Obstetrics and Gynecology

## 2020-10-17 ENCOUNTER — Encounter: Payer: Self-pay | Admitting: Obstetrics and Gynecology

## 2020-10-17 VITALS — BP 126/74 | HR 78 | Resp 16 | Ht 62.0 in | Wt 200.0 lb

## 2020-10-17 DIAGNOSIS — L0292 Furuncle, unspecified: Secondary | ICD-10-CM

## 2020-10-17 MED ORDER — CEPHALEXIN 500 MG PO CAPS: 500.0000 mg | ORAL_CAPSULE | Freq: Three times a day (TID) | ORAL | 2 refills | Status: DC

## 2020-10-17 NOTE — Progress Notes (Signed)
45 yo P3 here for evaluation of vulva lesions. 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 She reports noticing a lesions on Monday which was painful to touch. As the week progressed, the lesion increased in size and walking and sitting became more uncomfortable. Patient is without any other complaints.  Past Medical History:  Diagnosis Date  . Spondylisthesis 05/16/2012  . Spondylisthesis 05/16/2012   Past Surgical History:  Procedure Laterality Date  . BREAST REDUCTION SURGERY  08/2014  . REDUCTION MAMMAPLASTY     Family History  Problem Relation Age of Onset  . Hypertension Father   . Psoriasis Father   . Skin cancer Other   . Breast cancer Neg Hx    Social History   Tobacco Use  . Smoking status: Never Smoker  . Smokeless tobacco: Never Used  Vaping Use  . Vaping Use: Never used  Substance Use Topics  . Alcohol use: No  . Drug use: No   ROS See pertinent in HPI. All other systems reviewed and non contributory Blood pressure 126/74, pulse 78, resp. rate 16, height 5\' 2"  (1.575 m), weight 200 lb (90.7 kg), last menstrual period 09/28/2020. GENERAL: Well-developed, well-nourished female in no acute distress.  ABDOMEN: Soft, nontender, nondistended. No organomegaly. PELVIC: Normal external female genitalia with a 1.5 cm boil on right buttock near right labia. Tender to touch, no fluctuance. Vagina is pink and rugated.  Normal discharge. EXTREMITIES: No cyanosis, clubbing, or edema, 2+ distal pulses.  A/P 45 yo with boil on perineum - Abscess not ready to be drained - Advised patient to continue warm compresses and sitz bath - rx keflex provided - Patient with normal pap smear and mammogram in 2021 - RTC prn

## 2020-10-17 NOTE — Patient Instructions (Signed)

## 2020-12-30 ENCOUNTER — Encounter: Payer: Self-pay | Admitting: Family Medicine

## 2021-01-07 ENCOUNTER — Ambulatory Visit: Payer: BC Managed Care – PPO | Admitting: Family Medicine

## 2021-01-08 ENCOUNTER — Other Ambulatory Visit: Payer: Self-pay | Admitting: Family Medicine

## 2021-01-08 ENCOUNTER — Other Ambulatory Visit: Payer: Self-pay

## 2021-01-08 ENCOUNTER — Ambulatory Visit (INDEPENDENT_AMBULATORY_CARE_PROVIDER_SITE_OTHER): Payer: BC Managed Care – PPO

## 2021-01-08 DIAGNOSIS — Z1231 Encounter for screening mammogram for malignant neoplasm of breast: Secondary | ICD-10-CM

## 2021-01-13 ENCOUNTER — Encounter: Payer: Self-pay | Admitting: Family Medicine

## 2021-01-13 ENCOUNTER — Other Ambulatory Visit: Payer: Self-pay

## 2021-01-13 ENCOUNTER — Ambulatory Visit: Payer: BC Managed Care – PPO | Admitting: Family Medicine

## 2021-01-13 DIAGNOSIS — L309 Dermatitis, unspecified: Secondary | ICD-10-CM | POA: Diagnosis not present

## 2021-01-13 MED ORDER — CLOBETASOL PROPIONATE 0.05 % EX CREA: 1.0000 "application " | TOPICAL_CREAM | Freq: Two times a day (BID) | CUTANEOUS | 2 refills | Status: DC

## 2021-01-13 NOTE — Patient Instructions (Signed)
Great to see you! Renewal of cream has been sent in.  See you at your physical!

## 2021-01-13 NOTE — Progress Notes (Signed)
Courtney Costa - 45 y.o. female MRN [id number]  Date of birth: 14-Jan-1976  Subjective Chief Complaint  Patient presents with  . Rash    HPI Courtney Costa is a 45 y.o. female here today to discuss refill of topical steroid.  She has used topical steroid to vulvar area due irritation from moisture related to prolapsed bladder.  Clobetasol cream has worked very well for her.  She is requesting refill of this.  She denies pain, vaginal discharge, dysuria.   ROS:  A comprehensive ROS was completed and negative except as noted per HPI  Allergies  Allergen Reactions  . Vagisil [Anti-Itch Vaginal] Swelling    Past Medical History:  Diagnosis Date  . Spondylisthesis 05/16/2012  . Spondylisthesis 05/16/2012    Past Surgical History:  Procedure Laterality Date  . BREAST REDUCTION SURGERY  08/2014  . REDUCTION MAMMAPLASTY Bilateral     Social History   Socioeconomic History  . Marital status: Married    Spouse name: Not on file  . Number of children: Not on file  . Years of education: Not on file  . Highest education level: Not on file  Occupational History  . Not on file  Tobacco Use  . Smoking status: Never Smoker  . Smokeless tobacco: Never Used  Vaping Use  . Vaping Use: Never used  Substance and Sexual Activity  . Alcohol use: No  . Drug use: No  . Sexual activity: Yes    Birth control/protection: None  Other Topics Concern  . Not on file  Social History Narrative  . Not on file   Social Determinants of Health   Financial Resource Strain: Not on file  Food Insecurity: Not on file  Transportation Needs: Not on file  Physical Activity: Not on file  Stress: Not on file  Social Connections: Not on file    Family History  Problem Relation Age of Onset  . Hypertension Father   . Psoriasis Father   . Breast cancer Neg Hx     Health Maintenance  Topic Date Due  . Hepatitis C Screening  Never done  . COLONOSCOPY (Pts 45-37yrs Insurance  coverage will need to be confirmed)  Never done  . INFLUENZA VACCINE  04/07/2021  . PAP SMEAR-Modifier  07/31/2022  . TETANUS/TDAP  08/14/2026  . COVID-19 Vaccine  Completed  . HIV Screening  Completed  . HPV VACCINES  Aged Out     ----------------------------------------------------------------------------------------------------------------------------------------------------------------------------------------------------------------- Physical Exam BP 131/83 (BP Location: Left Arm, Patient Position: Sitting, Cuff Size: Normal)   Pulse 70   Temp 97.6 F (36.4 C)   Wt 203 lb 6.4 oz (92.3 kg)   SpO2 96%   BMI 37.20 kg/m   Physical Exam Constitutional:      Appearance: Normal appearance.  Eyes:     General: No scleral icterus. Musculoskeletal:     Cervical back: Neck supple.  Neurological:     General: No focal deficit present.     Mental Status: She is alert.  Psychiatric:        Mood and Affect: Mood normal.        Behavior: Behavior normal.     ------------------------------------------------------------------------------------------------------------------------------------------------------------------------------------------------------------------- Assessment and Plan  Vulvar dermatitis She has done well with clobetasol as needed.  Will continue this.  Discussed signs of yeast vaginitis and she is instructed to contact me if symptoms change or are no longer responding to steroid cream.    Meds ordered this encounter  Medications  . clobetasol cream (TEMOVATE) 0.05 %  Sig: Apply 1 application topically 2 (two) times daily.    Dispense:  60 g    Refill:  2    No follow-ups on file.    This visit occurred during the SARS-CoV-2 public health emergency.  Safety protocols were in place, including screening questions prior to the visit, additional usage of staff PPE, and extensive cleaning of exam room while observing appropriate contact time as indicated for  disinfecting solutions.

## 2021-01-13 NOTE — Assessment & Plan Note (Signed)
She has done well with clobetasol as needed.  Will continue this.  Discussed signs of yeast vaginitis and she is instructed to contact me if symptoms change or are no longer responding to steroid cream.

## 2021-01-16 ENCOUNTER — Encounter: Payer: BC Managed Care – PPO | Admitting: Family Medicine

## 2021-01-23 ENCOUNTER — Ambulatory Visit (INDEPENDENT_AMBULATORY_CARE_PROVIDER_SITE_OTHER): Payer: BC Managed Care – PPO | Admitting: Obstetrics and Gynecology

## 2021-01-23 ENCOUNTER — Other Ambulatory Visit: Payer: Self-pay

## 2021-01-23 ENCOUNTER — Encounter: Payer: Self-pay | Admitting: Obstetrics and Gynecology

## 2021-01-23 VITALS — BP 136/84 | HR 71 | Ht 61.0 in | Wt 205.2 lb

## 2021-01-23 DIAGNOSIS — N393 Stress incontinence (female) (male): Secondary | ICD-10-CM | POA: Diagnosis not present

## 2021-01-23 DIAGNOSIS — N8111 Cystocele, midline: Secondary | ICD-10-CM

## 2021-01-23 DIAGNOSIS — N920 Excessive and frequent menstruation with regular cycle: Secondary | ICD-10-CM | POA: Diagnosis not present

## 2021-01-23 MED ORDER — TRANEXAMIC ACID 650 MG PO TABS: 1300.0000 mg | ORAL_TABLET | Freq: Three times a day (TID) | ORAL | 2 refills | Status: DC

## 2021-01-23 MED ORDER — SOLIFENACIN SUCCINATE 5 MG PO TABS: 5.0000 mg | ORAL_TABLET | Freq: Every day | ORAL | 5 refills | Status: DC

## 2021-01-23 NOTE — Progress Notes (Signed)
Patient ID: Courtney Costa, female   DOB: 02-10-1976, 45 y.o.   MRN: [id number] [name] presents to discuss her heavy cycles and SUI. Cycles are still monthly, last 5-7 days, heavy, no cramps  Still problems with SUI and wears a pad for protection  H/O TSVD x 3 ( largest 8 # 6 oz) Nl GYN U/S 2021  Sexual active  Pap smear and mammogram UTP  PE AF VSS Lungs Clear Heart RRR Abd soft + BS GU nl EGBUS, grade 1 cystocele, uterus @ 10 weeks, mobile, no masses or tenderness  A/P Menorrhagia       SUI  Discussed Tx options with pt. Will try TXA for cycles and [facility] for SUI. R/B/U reviewed with pt. F/U in 3 months

## 2021-01-23 NOTE — Progress Notes (Signed)
Patient is in the office for annual. Last pap 08-01-19 Last mammogram 01-08-21 Pt reports history of prolapsed bladder, wants to discuss options. Pt reports heavy menstrual cycles, has to wear depends with a pad, changing 3x/day. LMP 12-06-20, pt cannot remember if she had a cycle in May.

## 2021-02-10 ENCOUNTER — Encounter: Payer: Self-pay | Admitting: Family Medicine

## 2021-02-10 ENCOUNTER — Ambulatory Visit (INDEPENDENT_AMBULATORY_CARE_PROVIDER_SITE_OTHER): Payer: BC Managed Care – PPO | Admitting: Family Medicine

## 2021-02-10 ENCOUNTER — Other Ambulatory Visit: Payer: Self-pay

## 2021-02-10 VITALS — BP 144/88 | HR 75 | Temp 97.8°F | Ht 60.83 in | Wt 198.0 lb

## 2021-02-10 DIAGNOSIS — Z13220 Encounter for screening for lipoid disorders: Secondary | ICD-10-CM

## 2021-02-10 DIAGNOSIS — Z1211 Encounter for screening for malignant neoplasm of colon: Secondary | ICD-10-CM

## 2021-02-10 DIAGNOSIS — R5383 Other fatigue: Secondary | ICD-10-CM

## 2021-02-10 DIAGNOSIS — Z0000 Encounter for general adult medical examination without abnormal findings: Secondary | ICD-10-CM | POA: Diagnosis not present

## 2021-02-10 NOTE — Assessment & Plan Note (Signed)
Well adult Orders Placed This Encounter  Procedures  . COMPLETE METABOLIC PANEL WITH GFR  . CBC with Differential  . Lipid Panel w/reflex Direct LDL  . TSH  . Ambulatory referral to Gastroenterology    Referral Priority:   Routine    Referral Type:   Consultation    Referral Reason:   Specialty Services Required    Number of Visits Requested:   1  Screening: Referral for colonoscopy, lipid panel Immunizations: UTD Anticipatory guidance/Risk factor reduction : Recommendations per AVS

## 2021-02-10 NOTE — Progress Notes (Signed)
Courtney Costa - 45 y.o. female MRN [id number]  Date of birth: 10/26/1975  Subjective Chief Complaint  Patient presents with  . Annual Exam    HPI Courtney Costa is a 45 y.o. female here today for annual exam.  She has been under a little more stress this week as her son is graduating and she has several family members coming into town.  BP is slightly elevated today.    She has had some difficulty with weight loss.  She is not really exercising and admits that diet could be improved.  Often times eats freezer meals or fast food out of convenience.    She is a non-smoker.  She does not consume EtOH regularly.    She is due for colon cancer screening.  Prefers to have this completed in [place].   Review of Systems  Constitutional: Negative for chills, fever, malaise/fatigue and weight loss.  HENT: Negative for congestion, ear pain and sore throat.   Eyes: Negative for blurred vision, double vision and pain.  Respiratory: Negative for cough and shortness of breath.   Cardiovascular: Negative for chest pain and palpitations.  Gastrointestinal: Negative for abdominal pain, blood in stool, constipation, heartburn and nausea.  Genitourinary: Negative for dysuria and urgency.  Musculoskeletal: Negative for joint pain and myalgias.  Neurological: Negative for dizziness and headaches.  Endo/Heme/Allergies: Does not bruise/bleed easily.  Psychiatric/Behavioral: Negative for depression. The patient is not nervous/anxious and does not have insomnia.      Allergies  Allergen Reactions  . Vagisil [Anti-Itch Vaginal] Swelling    Past Medical History:  Diagnosis Date  . Spondylisthesis 05/16/2012  . Spondylisthesis 05/16/2012    Past Surgical History:  Procedure Laterality Date  . BREAST REDUCTION SURGERY  08/2014  . REDUCTION MAMMAPLASTY Bilateral     Social History   Socioeconomic History  . Marital status: Married    Spouse name: Not on file  . Number of  children: Not on file  . Years of education: Not on file  . Highest education level: Not on file  Occupational History  . Not on file  Tobacco Use  . Smoking status: Never Smoker  . Smokeless tobacco: Never Used  Vaping Use  . Vaping Use: Never used  Substance and Sexual Activity  . Alcohol use: No  . Drug use: No  . Sexual activity: Yes    Birth control/protection: None  Other Topics Concern  . Not on file  Social History Narrative  . Not on file   Social Determinants of Health   Financial Resource Strain: Not on file  Food Insecurity: Not on file  Transportation Needs: Not on file  Physical Activity: Not on file  Stress: Not on file  Social Connections: Not on file    Family History  Problem Relation Age of Onset  . Hypertension Father   . Psoriasis Father   . Breast cancer Neg Hx     Health Maintenance  Topic Date Due  . Hepatitis C Screening  Never done  . COLONOSCOPY (Pts 45-70yrs Insurance coverage will need to be confirmed)  Never done  . INFLUENZA VACCINE  04/07/2021  . MAMMOGRAM  01/08/2022  . PAP SMEAR-Modifier  07/31/2022  . Zoster Vaccines- Shingrix (1 of 2) 02/16/2024  . TETANUS/TDAP  08/14/2026  . COVID-19 Vaccine  Completed  . HIV Screening  Completed  . Pneumococcal Vaccine 91-63 Years old  Aged Out  . HPV VACCINES  Aged Out     ----------------------------------------------------------------------------------------------------------------------------------------------------------------------------------------------------------------- Physical Exam  BP (!) 144/88 (BP Location: Left Arm, Patient Position: Sitting, Cuff Size: Normal)   Pulse 75   Temp 97.8 F (36.6 C)   Ht 5' 0.83" (1.545 m)   Wt 198 lb (89.8 kg)   SpO2 97%   BMI 37.62 kg/m   Physical Exam Constitutional:      General: She is not in acute distress. HENT:     Head: Normocephalic and atraumatic.     Right Ear: Tympanic membrane normal.     Left Ear: Tympanic membrane  normal.     Nose: Nose normal.  Eyes:     General: No scleral icterus.    Conjunctiva/sclera: Conjunctivae normal.  Neck:     Thyroid: No thyromegaly.  Cardiovascular:     Rate and Rhythm: Normal rate and regular rhythm.     Heart sounds: Normal heart sounds.  Pulmonary:     Effort: Pulmonary effort is normal.     Breath sounds: Normal breath sounds.  Abdominal:     General: Bowel sounds are normal. There is no distension.     Palpations: Abdomen is soft.     Tenderness: There is no abdominal tenderness. There is no guarding.  Musculoskeletal:        General: Normal range of motion.     Cervical back: Normal range of motion and neck supple.  Lymphadenopathy:     Cervical: No cervical adenopathy.  Skin:    General: Skin is warm and dry.     Findings: No rash.  Neurological:     Mental Status: She is alert and oriented to person, place, and time.     Cranial Nerves: No cranial nerve deficit.     Coordination: Coordination normal.  Psychiatric:        Behavior: Behavior normal.     ------------------------------------------------------------------------------------------------------------------------------------------------------------------------------------------------------------------- Assessment and Plan  Well adult exam Well adult Orders Placed This Encounter  Procedures  . COMPLETE METABOLIC PANEL WITH GFR  . CBC with Differential  . Lipid Panel w/reflex Direct LDL  . TSH  . Ambulatory referral to Gastroenterology    Referral Priority:   Routine    Referral Type:   Consultation    Referral Reason:   Specialty Services Required    Number of Visits Requested:   1  Screening: Referral for colonoscopy, lipid panel Immunizations: UTD Anticipatory guidance/Risk factor reduction : Recommendations per AVS   No orders of the defined types were placed in this encounter.   No follow-ups on file.    This visit occurred during the SARS-CoV-2 public health  emergency.  Safety protocols were in place, including screening questions prior to the visit, additional usage of staff PPE, and extensive cleaning of exam room while observing appropriate contact time as indicated for disinfecting solutions.

## 2021-02-10 NOTE — Patient Instructions (Signed)
Preventive Care 16-47 Years Old, Female Preventive care refers to lifestyle choices and visits with your health care provider that can promote health and wellness. This includes:  A yearly physical exam. This is also called an annual wellness visit.  Regular dental and eye exams.  Immunizations.  Screening for certain conditions.  Healthy lifestyle choices, such as: ? Eating a healthy diet. ? Getting regular exercise. ? Not using drugs or products that contain nicotine and tobacco. ? Limiting alcohol use. What can I expect for my preventive care visit? Physical exam Your health care provider will check your:  Height and weight. These may be used to calculate your BMI (body mass index). BMI is a measurement that tells if you are at a healthy weight.  Heart rate and blood pressure.  Body temperature.  Skin for abnormal spots. Counseling Your health care provider may ask you questions about your:  Past medical problems.  Family's medical history.  Alcohol, tobacco, and drug use.  Emotional well-being.  Home life and relationship well-being.  Sexual activity.  Diet, exercise, and sleep habits.  Work and work Statistician.  Access to firearms.  Method of birth control.  Menstrual cycle.  Pregnancy history. What immunizations do I need? Vaccines are usually given at various ages, according to a schedule. Your health care provider will recommend vaccines for you based on your age, medical history, and lifestyle or other factors, such as travel or where you work.   What tests do I need? Blood tests  Lipid and cholesterol levels. These may be checked every 5 years, or more often if you are over 25 years old.  Hepatitis C test.  Hepatitis B test. Screening  Lung cancer screening. You may have this screening every year starting at age 33 if you have a 30-pack-year history of smoking and currently smoke or have quit within the past 15 years.  Colorectal cancer  screening. ? All adults should have this screening starting at age 73 and continuing until age 39. ? Your health care provider may recommend screening at age 70 if you are at increased risk. ? You will have tests every 1-10 years, depending on your results and the type of screening test.  Diabetes screening. ? This is done by checking your blood sugar (glucose) after you have not eaten for a while (fasting). ? You may have this done every 1-3 years.  Mammogram. ? This may be done every 1-2 years. ? Talk with your health care provider about when you should start having regular mammograms. This may depend on whether you have a family history of breast cancer.  BRCA-related cancer screening. This may be done if you have a family history of breast, ovarian, tubal, or peritoneal cancers.  Pelvic exam and Pap test. ? This may be done every 3 years starting at age 3. ? Starting at age 58, this may be done every 5 years if you have a Pap test in combination with an HPV test. Other tests  STD (sexually transmitted disease) testing, if you are at risk.  Bone density scan. This is done to screen for osteoporosis. You may have this scan if you are at high risk for osteoporosis. Talk with your health care provider about your test results, treatment options, and if necessary, the need for more tests. Follow these instructions at home: Eating and drinking  Eat a diet that includes fresh fruits and vegetables, whole grains, lean protein, and low-fat dairy products.  Take vitamin and mineral supplements  as recommended by your health care provider.  Do not drink alcohol if: ? Your health care provider tells you not to drink. ? You are pregnant, may be pregnant, or are planning to become pregnant.  If you drink alcohol: ? Limit how much you have to 0-1 drink a day. ? Be aware of how much alcohol is in your drink. In the [place] one drink equals one 12 oz bottle of beer (355 mL), one 5 oz glass of  wine (148 mL), or one 1 oz glass of hard liquor (44 mL).   Lifestyle  Take daily care of your teeth and gums. Brush your teeth every morning and night with fluoride toothpaste. Floss one time each day.  Stay active. Exercise for at least 30 minutes 5 or more days each week.  Do not use any products that contain nicotine or tobacco, such as cigarettes, e-cigarettes, and chewing tobacco. If you need help quitting, ask your health care provider.  Do not use drugs.  If you are sexually active, practice safe sex. Use a condom or other form of protection to prevent STIs (sexually transmitted infections).  If you do not wish to become pregnant, use a form of birth control. If you plan to become pregnant, see your health care provider for a prepregnancy visit.  If told by your health care provider, take low-dose aspirin daily starting at age 65.  Find healthy ways to cope with stress, such as: ? Meditation, yoga, or listening to music. ? Journaling. ? Talking to a trusted person. ? Spending time with friends and family. Safety  Always wear your seat belt while driving or riding in a vehicle.  Do not drive: ? If you have been drinking alcohol. Do not ride with someone who has been drinking. ? When you are tired or distracted. ? While texting.  Wear a helmet and other protective equipment during sports activities.  If you have firearms in your house, make sure you follow all gun safety procedures. What's next?  Visit your health care provider once a year for an annual wellness visit.  Ask your health care provider how often you should have your eyes and teeth checked.  Stay up to date on all vaccines. This information is not intended to replace advice given to you by your health care provider. Make sure you discuss any questions you have with your health care provider. Document Revised: 05/28/2020 Document Reviewed: 05/05/2018 Elsevier Patient Education  2021 [employer].

## 2021-02-11 LAB — CBC WITH DIFFERENTIAL/PLATELET
Absolute Monocytes: 384 cells/uL (ref 200–950)
Basophils Absolute: 39 cells/uL (ref 0–200)
Basophils Relative: 0.6 %
Eosinophils Absolute: 78 cells/uL (ref 15–500)
Eosinophils Relative: 1.2 %
HCT: 43.7 % (ref 35.0–45.0)
Hemoglobin: 14.2 g/dL (ref 11.7–15.5)
Lymphs Abs: 2054 cells/uL (ref 850–3900)
MCH: 28.8 pg (ref 27.0–33.0)
MCHC: 32.5 g/dL (ref 32.0–36.0)
MCV: 88.6 fL (ref 80.0–100.0)
MPV: 11.3 fL (ref 7.5–12.5)
Monocytes Relative: 5.9 %
Neutro Abs: 3946 cells/uL (ref 1500–7800)
Neutrophils Relative %: 60.7 %
Platelets: 266 10*3/uL (ref 140–400)
RBC: 4.93 10*6/uL (ref 3.80–5.10)
RDW: 12.4 % (ref 11.0–15.0)
Total Lymphocyte: 31.6 %
WBC: 6.5 10*3/uL (ref 3.8–10.8)

## 2021-02-11 LAB — COMPLETE METABOLIC PANEL WITH GFR
AG Ratio: 1.5 (calc) (ref 1.0–2.5)
ALT: 27 U/L (ref 6–29)
AST: 19 U/L (ref 10–35)
Albumin: 4.1 g/dL (ref 3.6–5.1)
Alkaline phosphatase (APISO): 79 U/L (ref 31–125)
BUN: 12 mg/dL (ref 7–25)
CO2: 26 mmol/L (ref 20–32)
Calcium: 9.4 mg/dL (ref 8.6–10.2)
Chloride: 102 mmol/L (ref 98–110)
Creat: 0.66 mg/dL (ref 0.50–1.10)
GFR, Est African American: 123 mL/min/{1.73_m2} (ref >=60)
GFR, Est Non African American: 106 mL/min/{1.73_m2} (ref >=60)
Globulin: 2.7 g/dL (calc) (ref 1.9–3.7)
Glucose, Bld: 88 mg/dL (ref 65–99)
Potassium: 4.4 mmol/L (ref 3.5–5.3)
Sodium: 138 mmol/L (ref 135–146)
Total Bilirubin: 0.7 mg/dL (ref 0.2–1.2)
Total Protein: 6.8 g/dL (ref 6.1–8.1)

## 2021-02-11 LAB — TSH: TSH: 3.19 mIU/L

## 2021-02-11 LAB — LIPID PANEL W/REFLEX DIRECT LDL
Cholesterol: 215 mg/dL — ABNORMAL HIGH (ref <200)
HDL: 66 mg/dL (ref >=50)
LDL Cholesterol (Calc): 124 mg/dL (calc) — ABNORMAL HIGH
Non-HDL Cholesterol (Calc): 149 mg/dL (calc) — ABNORMAL HIGH (ref <130)
Total CHOL/HDL Ratio: 3.3 (calc) (ref <5.0)
Triglycerides: 130 mg/dL (ref <150)

## 2021-02-21 DIAGNOSIS — J0110 Acute frontal sinusitis, unspecified: Secondary | ICD-10-CM | POA: Diagnosis not present

## 2021-02-26 ENCOUNTER — Encounter: Payer: Self-pay | Admitting: Family Medicine

## 2021-02-27 ENCOUNTER — Other Ambulatory Visit: Payer: Self-pay | Admitting: Family Medicine

## 2021-02-27 MED ORDER — SEMAGLUTIDE-WEIGHT MANAGEMENT 0.25 MG/0.5ML ~~LOC~~ SOAJ: 0.2500 mg | SUBCUTANEOUS | 0 refills | Status: AC

## 2021-02-27 MED ORDER — SEMAGLUTIDE-WEIGHT MANAGEMENT 2.4 MG/0.75ML ~~LOC~~ SOAJ: 2.4000 mg | SUBCUTANEOUS | 2 refills | Status: DC

## 2021-02-27 MED ORDER — SEMAGLUTIDE-WEIGHT MANAGEMENT 1.7 MG/0.75ML ~~LOC~~ SOAJ: 1.7000 mg | SUBCUTANEOUS | 0 refills | Status: DC

## 2021-02-27 MED ORDER — SEMAGLUTIDE-WEIGHT MANAGEMENT 0.5 MG/0.5ML ~~LOC~~ SOAJ: 0.5000 mg | SUBCUTANEOUS | 0 refills | Status: AC

## 2021-02-27 MED ORDER — SEMAGLUTIDE-WEIGHT MANAGEMENT 1 MG/0.5ML ~~LOC~~ SOAJ
1.0000 mg | SUBCUTANEOUS | 0 refills | Status: DC
Start: 2021-04-26 — End: 2021-05-08

## 2021-03-01 DIAGNOSIS — J019 Acute sinusitis, unspecified: Secondary | ICD-10-CM | POA: Diagnosis not present

## 2021-03-01 DIAGNOSIS — A499 Bacterial infection, unspecified: Secondary | ICD-10-CM | POA: Diagnosis not present

## 2021-03-06 ENCOUNTER — Other Ambulatory Visit: Payer: Self-pay | Admitting: Family Medicine

## 2021-04-11 DIAGNOSIS — Z1211 Encounter for screening for malignant neoplasm of colon: Secondary | ICD-10-CM | POA: Diagnosis not present

## 2021-04-11 LAB — HM COLONOSCOPY

## 2021-05-08 ENCOUNTER — Encounter: Payer: Self-pay | Admitting: Family Medicine

## 2021-05-08 MED ORDER — SEMAGLUTIDE-WEIGHT MANAGEMENT 1.7 MG/0.75ML ~~LOC~~ SOAJ: 1.7000 mg | SUBCUTANEOUS | 0 refills | Status: AC

## 2021-05-08 MED ORDER — SEMAGLUTIDE-WEIGHT MANAGEMENT 1 MG/0.5ML ~~LOC~~ SOAJ: 1.0000 mg | SUBCUTANEOUS | 0 refills | Status: AC

## 2021-05-08 MED ORDER — SEMAGLUTIDE-WEIGHT MANAGEMENT 2.4 MG/0.75ML ~~LOC~~ SOAJ: 2.4000 mg | SUBCUTANEOUS | 2 refills | Status: AC

## 2021-05-15 ENCOUNTER — Emergency Department (INDEPENDENT_AMBULATORY_CARE_PROVIDER_SITE_OTHER)
Admission: RE | Admit: 2021-05-15 | Discharge: 2021-05-15 | Disposition: A | Discharge status: Home / Self Care | Payer: BC Managed Care – PPO | Source: Ambulatory Visit

## 2021-05-15 ENCOUNTER — Telehealth: Payer: Self-pay

## 2021-05-15 ENCOUNTER — Other Ambulatory Visit: Payer: Self-pay

## 2021-05-15 VITALS — BP 123/83 | HR 90 | Temp 98.8°F | Resp 18

## 2021-05-15 DIAGNOSIS — T783XXA Angioneurotic edema, initial encounter: Secondary | ICD-10-CM | POA: Diagnosis not present

## 2021-05-15 DIAGNOSIS — K120 Recurrent oral aphthae: Secondary | ICD-10-CM | POA: Diagnosis not present

## 2021-05-15 DIAGNOSIS — T7840XA Allergy, unspecified, initial encounter: Secondary | ICD-10-CM | POA: Diagnosis not present

## 2021-05-15 DIAGNOSIS — Z789 Other specified health status: Secondary | ICD-10-CM | POA: Diagnosis not present

## 2021-05-15 MED ORDER — METHYLPREDNISOLONE ACETATE 80 MG/ML IJ SUSP
80.0000 mg | Freq: Once | INTRAMUSCULAR | Status: AC
  Administered 2021-05-15: 80 mg via INTRAMUSCULAR

## 2021-05-15 MED ORDER — PREDNISONE 20 MG PO TABS: ORAL_TABLET | ORAL | 0 refills | Status: DC

## 2021-05-15 MED ORDER — CHLORHEXIDINE GLUCONATE 0.12 % MT SOLN: 15.0000 mL | Freq: Two times a day (BID) | OROMUCOSAL | 0 refills | Status: AC

## 2021-05-15 NOTE — Discharge Instructions (Addendum)
Instructed/advised patient take medication as directed with food to completion.  Advised/instructed patient may use Peridex twice daily.  Encouraged patient to increase daily water intake while taking these medications.

## 2021-05-15 NOTE — ED Provider Notes (Signed)
[facility]    CSN: [id number] Arrival date & time: 05/15/21  0943      History   Chief Complaint Chief Complaint  Patient presents with   Allergic Reaction   Mouth Lesions    HPI Courtney Costa is a 45 y.o. female.   HPI 45 year old female presents canker sores inside of mouth lip and under tongue for 2 days.  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 Authorizing Provider  chlorhexidine (PERIDEX) 0.12 % solution Use as directed 15 mLs in the mouth or throat 2 (two) times daily for 7 days. 05/15/21 05/22/21 Yes Trevor Iha, FNP  predniSONE (DELTASONE) 20 MG tablet Take 3 tabs PO x 5 days. 05/15/21  Yes Trevor Iha, FNP  clobetasol cream (TEMOVATE) 0.05 % Apply 1 application topically 2 (two) times daily. 01/13/21   Everrett Coombe, DO  Semaglutide-Weight Management 1 MG/0.5ML SOAJ Inject 1 mg into the skin once a week for 28 days. 05/08/21 06/05/21  Everrett Coombe, DO  Semaglutide-Weight Management 1.7 MG/0.75ML SOAJ Inject 1.7 mg into the skin  once a week for 28 days. 05/25/21 06/22/21  Everrett Coombe, DO  Semaglutide-Weight Management 2.4 MG/0.75ML SOAJ Inject 2.4 mg into the skin once a week for 28 days. 06/23/21 07/21/21  Everrett Coombe, DO  solifenacin (VESICARE) 5 MG tablet Take 1 tablet (5 mg total) by mouth daily. 01/23/21   Hermina Staggers, MD  tranexamic acid (LYSTEDA) 650 MG TABS tablet Take 2 tablets (1,300 mg total) by mouth 3 (three) times daily. Take during menses for a maximum of five days 01/23/21   Hermina Staggers, MD    Family History Family History  Problem Relation Age of Onset   Hypertension Father    Psoriasis Father    Breast cancer Neg Hx     Social History Social History   Tobacco Use   Smoking status: Never   Smokeless tobacco: Never  Vaping Use   Vaping Use: Never used  Substance Use Topics   Alcohol use: No   Drug use: No     Allergies   Vagisil [anti-itch vaginal]   Review of Systems Review of Systems  HENT:  Positive for mouth sores.   All other systems reviewed and are negative.   Physical Exam Triage Vital  Signs ED Triage Vitals  Enc Vitals Group     BP 05/15/21 0956 123/83     Pulse Rate 05/15/21 0955 90     Resp 05/15/21 0955 18     Temp 05/15/21 0955 98.8 F (37.1 C)     Temp src --      SpO2 05/15/21 0956 98 %     Weight --      Height --      Head Circumference --      Peak Flow --      Pain Score 05/15/21 0956 6     Pain Loc --      Pain Edu? --      Excl. in GC? --    No data found.  Updated Vital Signs BP 123/83   Pulse 90   Temp 98.8 F (37.1 C)   Resp 18   SpO2 98%   Physical Exam Vitals and nursing note reviewed.  Constitutional:      Appearance: Normal appearance. She is obese.  HENT:     Head: Normocephalic and atraumatic.     Right Ear: Tympanic membrane, ear canal and external ear normal.     Left Ear: Tympanic membrane, ear canal and external ear normal.     Mouth/Throat:     Mouth: Mucous membranes are moist.     Pharynx:  Oropharynx is clear.     Comments: Mouth: Erythematous aphthous ulcers noted of frenulum of tongue, openings of the submandibular ducts, gingival covering of maximal alveolar process, satellite diffuse aphthous ulcers of gingival/buccal mucosa noted. Eyes:     Extraocular Movements: Extraocular movements intact.     Conjunctiva/sclera: Conjunctivae normal.     Pupils: Pupils are equal, round, and reactive to light.  Cardiovascular:     Rate and Rhythm: Normal rate and regular rhythm.     Pulses: Normal pulses.     Heart sounds: Normal heart sounds.  Pulmonary:     Effort: Pulmonary effort is normal.     Breath sounds: Normal breath sounds. No wheezing, rhonchi or rales.  Musculoskeletal:        General: Normal range of motion.     Cervical back: Normal range of motion and neck supple. Tenderness present.  Lymphadenopathy:     Cervical: Cervical adenopathy present.  Skin:    General: Skin is warm.  Neurological:     General: No focal deficit present.     Mental Status: She is alert and oriented to person, place, and time. Mental status is at baseline.  Psychiatric:        Mood and Affect: Mood normal.        Behavior: Behavior normal.        Thought Content: Thought content normal.     UC Treatments / Results  Labs (all labs ordered are listed, but only abnormal results are displayed) Labs Reviewed - No data to display  EKG   Radiology No results found.  Procedures Procedures (including critical care time)  Medications Ordered in UC Medications  methylPREDNISolone acetate (DEPO-MEDROL) injection 80 mg (80 mg Intramuscular Given 05/15/21 1103)    Initial Impression / Assessment and Plan / UC Course  I have reviewed the triage vital signs and the nursing notes.  Pertinent labs & imaging results that were available during my care of the patient were reviewed by me and considered in my medical decision making (see chart for details).     MDM: 1.  Allergic reaction,  initial encounter-IM  Depo-Medrol 80 given once in clinic prior to discharge, Rx prednisone burst; 2.  Aphthous ulcer of mouth-Rx'd Peridex; Instructed/advised patient take medication as directed with food to completion.  Advised/instructed patient may use Peridex twice daily.  Encouraged patient to increase daily water intake while taking these medications.  Patient discharged home, hemodynamically stable. Final Clinical Impressions(s) / UC Diagnoses   Final diagnoses:  Allergic reaction, initial encounter  Aphthous ulcer of mouth     Discharge Instructions      Instructed/advised patient take medication as directed with food to completion.  Advised/instructed patient may use Peridex twice daily.  Encouraged patient to increase daily water intake while taking these medications.     ED Prescriptions     Medication Sig Dispense Auth. Provider   predniSONE (DELTASONE) 20 MG tablet Take 3 tabs PO x 5 days. 15 tablet Trevor Iha, FNP   chlorhexidine (PERIDEX) 0.12 % solution Use as directed 15 mLs in the mouth or throat 2 (two) times daily for 7 days. 473 mL Trevor Iha, FNP      PDMP not reviewed this encounter.   Trevor Iha, FNP 05/15/21 1113

## 2021-05-15 NOTE — Telephone Encounter (Signed)
Using Covermymeds a PA was completed and submitted, awaiting response. Also, used new function in covermymeds where the patient can get text updates about her PA.

## 2021-05-15 NOTE — ED Triage Notes (Addendum)
Pt here with canker sores on inside upper left lip and under tongue. Started feeling achy in joints as well. Put some [place] ointment on sores and starting feeling tingling in throat and mouth throughout the night. Swelling also apparent and worse this morning. Ling sounds clear and no obstructing throat swelling.

## 2021-06-12 NOTE — Telephone Encounter (Signed)
Medication: Semaglutide-Weight Management 1.7 MG/0.75ML SOAJ Appeal submitted via CoverMyMeds on 06/12/2021 Appeal submission pending

## 2021-06-19 NOTE — Telephone Encounter (Signed)
Medication: Semaglutide-Weight Management 1.7 MG/0.75ML SOAJ Prior authorization determination received Medication has been denied Reason for denial:  "This health benefit plan does not cover the following services, supplies, drugs or charges: Any treatment or regimen, medical or surgical, for the purpose of reducing or controlling the weight of the member, or for the treatment of obesity, except for surgical treatment of morbid obesity, or as specifically covered by this health benefit plan."

## 2021-09-19 ENCOUNTER — Encounter: Payer: Self-pay | Admitting: Family Medicine

## 2021-09-19 DIAGNOSIS — J019 Acute sinusitis, unspecified: Secondary | ICD-10-CM | POA: Diagnosis not present

## 2021-09-25 LAB — HEMOGLOBIN A1C: Hemoglobin A1C: 5.4

## 2021-09-25 LAB — LIPID PANEL
Cholesterol: 231 — AB (ref 0–200)
HDL: 59 (ref 35–70)
LDL Cholesterol: 131
LDl/HDL Ratio: 3.9
Triglycerides: 209 — AB (ref 40–160)

## 2021-10-03 ENCOUNTER — Encounter: Payer: Self-pay | Admitting: Family Medicine

## 2021-10-03 NOTE — Telephone Encounter (Signed)
Pt has been scheduled with PCP.  

## 2021-10-03 NOTE — Telephone Encounter (Signed)
Please contact the patient to schedule an appointment with provider for weight loss rx. Thanks in advance.

## 2021-10-23 ENCOUNTER — Ambulatory Visit: Payer: BC Managed Care – PPO | Admitting: Family Medicine

## 2021-10-23 ENCOUNTER — Other Ambulatory Visit: Payer: Self-pay

## 2021-10-23 ENCOUNTER — Encounter: Payer: Self-pay | Admitting: Family Medicine

## 2021-10-23 VITALS — BP 142/88 | HR 82 | Ht 62.0 in | Wt 210.0 lb

## 2021-10-23 DIAGNOSIS — F321 Major depressive disorder, single episode, moderate: Secondary | ICD-10-CM | POA: Diagnosis not present

## 2021-10-23 DIAGNOSIS — R635 Abnormal weight gain: Secondary | ICD-10-CM | POA: Insufficient documentation

## 2021-10-23 MED ORDER — BUPROPION HCL ER (XL) 150 MG PO TB24: 150.0000 mg | ORAL_TABLET | Freq: Every day | ORAL | 1 refills | Status: DC

## 2021-10-23 MED ORDER — CLOBETASOL PROPIONATE 0.05 % EX CREA: 1.0000 "application " | TOPICAL_CREAM | Freq: Two times a day (BID) | CUTANEOUS | 2 refills | Status: DC

## 2021-10-23 MED ORDER — PHENTERMINE HCL 37.5 MG PO CAPS: 37.5000 mg | ORAL_CAPSULE | ORAL | 0 refills | Status: DC

## 2021-10-23 NOTE — Assessment & Plan Note (Signed)
I think she needs comprehensive treatment for management of her weight and I recommended that she see healthy weight and wellness.  I will go ahead and start phentermine again to help with appetite suppression and get her started with weight loss.

## 2021-10-23 NOTE — Assessment & Plan Note (Signed)
I think she is dealing with some depression which is hindering her efforts for weight management.  I recommend she look into counseling through EAP program with her employer.  Also going to add bupropion to help with depressive symptoms as well as disordered eating.

## 2021-10-23 NOTE — Progress Notes (Signed)
Courtney Costa - 46 y.o. female MRN [id number]  Date of birth: 01-12-1976  Subjective Chief Complaint  Patient presents with   Follow-up    HPI Courtney Costa is a 46 year old female here today for follow-up of abnormal weight gain.  She continues to have difficulty controlling her weight.  Reports several stressors which are contributing to her difficulty with weight loss.  She reports that she has felt more down and less motivated to exercise.  Cite stressors including work stress, stress with her marriage and her husband's alcohol use, caring for her daughter who was recently diagnosed with alopecia and having to share a house with her husband's family.  She does admit to stress eating and binge eating.  She did try phentermine briefly in the past with some success.  We tried to get Reginal Lutes approved for her however her insurance did not provide any coverage for medications to help with weight management.  ROS:  A comprehensive ROS was completed and negative except as noted per HPI  Allergies  Allergen Reactions   Vagisil [Anti-Itch Vaginal] Swelling    Past Medical History:  Diagnosis Date   Spondylisthesis 05/16/2012   Spondylisthesis 05/16/2012    Past Surgical History:  Procedure Laterality Date   BREAST REDUCTION SURGERY  08/2014   REDUCTION MAMMAPLASTY Bilateral     Social History   Socioeconomic History   Marital status: Married    Spouse name: Not on file   Number of children: Not on file   Years of education: Not on file   Highest education level: Not on file  Occupational History   Not on file  Tobacco Use   Smoking status: Never   Smokeless tobacco: Never  Vaping Use   Vaping Use: Never used  Substance and Sexual Activity   Alcohol use: No   Drug use: No   Sexual activity: Yes    Birth control/protection: None  Other Topics Concern   Not on file  Social History Narrative   Not on file   Social Determinants of Health   Financial Resource Strain: Not on  file  Food Insecurity: Not on file  Transportation Needs: Not on file  Physical Activity: Not on file  Stress: Not on file  Social Connections: Not on file    Family History  Problem Relation Age of Onset   Hypertension Father    Psoriasis Father    Breast cancer Neg Hx     Health Maintenance  Topic Date Due   Hepatitis C Screening  Never done   COLONOSCOPY (Pts 45-27yrs Insurance coverage will need to be confirmed)  Never done   COVID-19 Vaccine (3 - Booster for [employer] series) 11/08/2021 (Originally 10/31/2020)   MAMMOGRAM  01/08/2022   PAP SMEAR-Modifier  07/31/2022   TETANUS/TDAP  08/14/2026   INFLUENZA VACCINE  Completed   HIV Screening  Completed   HPV VACCINES  Aged Out     ----------------------------------------------------------------------------------------------------------------------------------------------------------------------------------------------------------------- Physical Exam BP (!) 142/88 (BP Location: Left Arm, Patient Position: Sitting, Cuff Size: Large)    Pulse 82    Ht 5\' 2"  (1.575 m)    Wt 210 lb (95.3 kg)    SpO2 99%    BMI 38.41 kg/m   Physical Exam Constitutional:      Appearance: Normal appearance.  Eyes:     General: No scleral icterus. Cardiovascular:     Rate and Rhythm: Normal rate and regular rhythm.  Pulmonary:     Effort: Pulmonary effort is normal.  Breath sounds: Normal breath sounds.  Musculoskeletal:     Cervical back: Neck supple.  Neurological:     Mental Status: She is alert.    ------------------------------------------------------------------------------------------------------------------------------------------------------------------------------------------------------------------- Assessment and Plan  Depression I think she is dealing with some depression which is hindering her efforts for weight management.  I recommend she look into counseling through EAP program with her employer.  Also going to add  bupropion to help with depressive symptoms as well as disordered eating.  Abnormal weight gain I think she needs comprehensive treatment for management of her weight and I recommended that she see healthy weight and wellness.  I will go ahead and start phentermine again to help with appetite suppression and get her started with weight loss.   Meds ordered this encounter  Medications   phentermine 37.5 MG capsule    Sig: Take 1 capsule (37.5 mg total) by mouth every morning.    Dispense:  90 capsule    Refill:  0   buPROPion (WELLBUTRIN XL) 150 MG 24 hr tablet    Sig: Take 1 tablet (150 mg total) by mouth daily.    Dispense:  90 tablet    Refill:  1   clobetasol cream (TEMOVATE) 0.05 %    Sig: Apply 1 application topically 2 (two) times daily.    Dispense:  60 g    Refill:  2    Return in about 6 weeks (around 12/04/2021) for Weight mgmt/Mood.    This visit occurred during the SARS-CoV-2 public health emergency.  Safety protocols were in place, including screening questions prior to the visit, additional usage of staff PPE, and extensive cleaning of exam room while observing appropriate contact time as indicated for disinfecting solutions.

## 2021-10-23 NOTE — Patient Instructions (Signed)
Great to see you today! Use EAP program for counseling.  Start bupropion 150mg  daily.  Start phentermine 37.5mg  daily  Follow up with me in 6 weeks.

## 2021-10-25 ENCOUNTER — Encounter: Payer: Self-pay | Admitting: Family Medicine

## 2022-01-08 ENCOUNTER — Ambulatory Visit: Payer: BC Managed Care – PPO

## 2022-01-08 DIAGNOSIS — Z1231 Encounter for screening mammogram for malignant neoplasm of breast: Secondary | ICD-10-CM

## 2022-02-05 ENCOUNTER — Ambulatory Visit (INDEPENDENT_AMBULATORY_CARE_PROVIDER_SITE_OTHER): Payer: BC Managed Care – PPO

## 2022-02-05 ENCOUNTER — Encounter: Payer: Self-pay | Admitting: Sports Medicine

## 2022-02-05 ENCOUNTER — Ambulatory Visit: Payer: BC Managed Care – PPO | Admitting: Sports Medicine

## 2022-02-05 ENCOUNTER — Ambulatory Visit: Payer: BC Managed Care – PPO | Admitting: Family Medicine

## 2022-02-05 DIAGNOSIS — Z6836 Body mass index (BMI) 36.0-36.9, adult: Secondary | ICD-10-CM

## 2022-02-05 DIAGNOSIS — N8111 Cystocele, midline: Secondary | ICD-10-CM | POA: Diagnosis not present

## 2022-02-05 DIAGNOSIS — N913 Primary oligomenorrhea: Secondary | ICD-10-CM

## 2022-02-05 DIAGNOSIS — E6609 Other obesity due to excess calories: Secondary | ICD-10-CM

## 2022-02-05 DIAGNOSIS — Z1231 Encounter for screening mammogram for malignant neoplasm of breast: Secondary | ICD-10-CM

## 2022-02-05 DIAGNOSIS — E66812 Obesity, class 2: Secondary | ICD-10-CM

## 2022-02-05 DIAGNOSIS — N915 Oligomenorrhea, unspecified: Secondary | ICD-10-CM | POA: Insufficient documentation

## 2022-02-05 NOTE — Assessment & Plan Note (Signed)
Has tried multiple medications including phentermine, Mancel Parsons was a plan exclusion, has tried diet, exercise and is unable to lose weight. She does have snoring, joint aches and pains, depression. I think it is time to consider bariatric surgery.

## 2022-02-05 NOTE — Assessment & Plan Note (Signed)
Pleasant 46 year old female, more recently has noted spacing out of her periods, most recent was 3 months, she just had her cycle and is currently on day 6. She took 2 pregnancy test both of which were negative. Her mother had menopause at 51. She is having vasa motor instability and hot flashes. I do think she is perimenopausal, we discussed endocrine physiology and the hypothalamic, pituitary, gonadal axis. We will check FSH, LH, prolactin, TSH, beta-hCG, testosterone as an evaluation for PCOS, estrogens and progesterone. I also explained the importance of body mass index, peripheral estrogen production and oligomenorrhea. See below for that.

## 2022-02-05 NOTE — Progress Notes (Signed)
    Procedures performed today:    None.  Independent interpretation of notes and tests performed by another provider:   None.  Brief History, Exam, Impression, and Recommendations:    Oligomenorrhea Pleasant 46 year old female, more recently has noted spacing out of her periods, most recent was 3 months, she just had her cycle and is currently on day 6. She took 2 pregnancy test both of which were negative. Her mother had menopause at 2. She is having vasa motor instability and hot flashes. I do think she is perimenopausal, we discussed endocrine physiology and the hypothalamic, pituitary, gonadal axis. We will check FSH, LH, prolactin, TSH, beta-hCG, testosterone as an evaluation for PCOS, estrogens and progesterone. I also explained the importance of body mass index, peripheral estrogen production and oligomenorrhea. See below for that.  Obesity Has tried multiple medications including phentermine, Reginal Lutes was a plan exclusion, has tried diet, exercise and is unable to lose weight. She does have snoring, joint aches and pains, depression. I think it is time to consider bariatric surgery.  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  I spent 30 minutes of total time managing this patient today, this includes chart review, face to face, and non-face to face time.  ___________________________________________ Ihor Austin. Benjamin Stain, M.D., ABFM., CAQSM. Primary Care and Sports Medicine [facility]  Adjunct Instructor of Family Medicine  [employer]

## 2022-02-05 NOTE — Assessment & Plan Note (Signed)
Courtney Costa is also having a long history of urinary incontinence due to bladder prolapse, she has done most of the conservative measures including pelvic therapy at home, medications such as oxybutynin, she continues to have discomfort, leakage during most activities, sex, it is probably time to consider a sling type procedure, I would like her to touch base with urology.

## 2022-02-20 LAB — PROGESTERONE: Progesterone: 1.6 ng/mL

## 2022-02-20 LAB — TESTOSTERONE, FREE & TOTAL
Free Testosterone: 3 pg/mL (ref 0.1–6.4)
Testosterone, Total, LC-MS-MS: 39 ng/dL (ref 2–45)

## 2022-02-20 LAB — HCG, QUANTITATIVE, PREGNANCY: HCG, Total, QN: <5 m[IU]/mL

## 2022-02-20 LAB — PROLACTIN: Prolactin: 9.8 ng/mL

## 2022-02-20 LAB — LUTEINIZING HORMONE: LH: 7.7 m[IU]/mL

## 2022-02-20 LAB — FOLLICLE STIMULATING HORMONE: FSH: 5.4 m[IU]/mL

## 2022-02-20 LAB — ESTROGENS, TOTAL: Estrogen: 787.8 pg/mL — ABNORMAL HIGH

## 2022-02-20 LAB — TSH: TSH: 3.63 mIU/L

## 2022-05-02 DIAGNOSIS — L509 Urticaria, unspecified: Secondary | ICD-10-CM | POA: Diagnosis not present

## 2022-05-06 ENCOUNTER — Telehealth: Payer: Self-pay | Admitting: Emergency Medicine

## 2022-05-06 ENCOUNTER — Ambulatory Visit
Admission: RE | Admit: 2022-05-06 | Discharge: 2022-05-06 | Disposition: A | Discharge status: Home / Self Care | Payer: BC Managed Care – PPO | Source: Ambulatory Visit

## 2022-05-06 VITALS — BP 127/87 | HR 98 | Temp 99.2°F | Resp 18 | Ht 62.0 in | Wt 208.0 lb

## 2022-05-06 DIAGNOSIS — T7840XA Allergy, unspecified, initial encounter: Secondary | ICD-10-CM

## 2022-05-06 DIAGNOSIS — L509 Urticaria, unspecified: Secondary | ICD-10-CM | POA: Diagnosis not present

## 2022-05-06 DIAGNOSIS — Z6838 Body mass index (BMI) 38.0-38.9, adult: Secondary | ICD-10-CM | POA: Diagnosis not present

## 2022-05-06 MED ORDER — HYDROXYZINE HCL 25 MG PO TABS: 25.0000 mg | ORAL_TABLET | Freq: Four times a day (QID) | ORAL | 0 refills | Status: DC

## 2022-05-06 MED ORDER — PREDNISONE 50 MG PO TABS: 50.0000 mg | ORAL_TABLET | Freq: Every day | ORAL | 0 refills | Status: DC

## 2022-05-06 MED ORDER — DEXAMETHASONE SODIUM PHOSPHATE 10 MG/ML IJ SOLN
10.0000 mg | Freq: Once | INTRAMUSCULAR | Status: AC
  Administered 2022-05-06: 10 mg via INTRAMUSCULAR

## 2022-05-06 NOTE — ED Triage Notes (Addendum)
Per pt "numbness on body, mostly face, neck and arms. Currently on Prednisone had Tele-Doctor on Saturday and determined I had hives. Started getting better but started noticing more rashes today and the numbness is getting worse" Claritin daily, forgot today & benadryl at night Used Lume  on arm pits 3 days prior to reaction Started phentermine  & wellbutrin 2 weeks ago

## 2022-05-06 NOTE — ED Provider Notes (Signed)
[facility]    CSN: [id number] Arrival date & time: 05/06/22  1438      History   Chief Complaint Chief Complaint  Patient presents with   Allergic Reaction    HPI Kay Shippy is a 46 y.o. female.   Pleasant 46 year old female presents today due to concerns of an allergic reaction.  Was recently started on both Wellbutrin and phentermine for weight loss.  Started having hives generalized across her body, but worse on her scalp, arms, and legs several days ago.  Had a telemed visit on Saturday and was prescribed 10 mg of prednisone.  Sig states "take per package instructions".  Patient states there were no instructions, so she has only been taking one 10 mg prednisone daily since Saturday, was prescribed 21 tabs total.  Feels this has not significantly decreased her hives.  Denies any swelling of her lips or tongue, dysphagia, shortness of breath, wheezing.  No GERD.  Additionally, patient continues to take her medications. Feels she is unable to lose the weight due to always being hungry.    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 Authorizing Provider  hydrOXYzine (ATARAX) 25 MG tablet Take 1 tablet (25 mg total) by mouth every 6 (six) hours. 05/06/22  Yes Tashon Capp L, PA  phentermine 37.5 MG capsule Take 37.5 mg by mouth every morning.   Yes [provider]  predniSONE (DELTASONE) 50 MG tablet Take 1 tablet (50 mg total) by mouth daily with breakfast. 05/06/22  Yes Tzippy Testerman L, PA    Family History Family History  Problem Relation Age of Onset   Hypertension Father    Psoriasis Father    Breast cancer Neg Hx     Social History Social History   Tobacco Use   Smoking status: Never   Smokeless tobacco: Never  Vaping Use   Vaping Use: Never used  Substance Use Topics   Alcohol use: No   Drug use: No     Allergies   Vagisil [anti-itch vaginal] and Benzocaine   Review of Systems Review of Systems Rash, hives, itching   Physical Exam Triage Vital Signs ED Triage Vitals  Enc Vitals Group  BP 05/06/22 1500 127/87     Pulse Rate 05/06/22 1500 98     Resp 05/06/22 1500 18     Temp 05/06/22 1500 99.2 F (37.3 C)     Temp Source 05/06/22 1500 Oral     SpO2 05/06/22 1500 97 %     Weight 05/06/22 1503 208 lb (94.3 kg)     Height 05/06/22 1503 5\' 2"  (1.575 m)     Head Circumference --      Peak Flow --      Pain Score 05/06/22 1502 4     Pain Loc --      Pain Edu? --      Excl. in GC? --    No data found.  Updated Vital Signs BP 127/87 (BP Location: Left Arm)   Pulse 98   Temp 99.2 F (37.3 C) (Oral)   Resp 18   Ht 5\' 2"  (1.575 m)   Wt 208 lb (94.3 kg)   SpO2 97%   BMI 38.04 kg/m   Visual Acuity Right Eye Distance:   Left Eye Distance:   Bilateral Distance:    Right Eye Near:   Left Eye Near:    Bilateral Near:     Physical Exam Vitals and nursing note reviewed.  Constitutional:       General: She is not in acute distress.    Appearance: Normal appearance. She is well-developed. She is obese. She is not ill-appearing or diaphoretic.  HENT:     Head: Normocephalic and atraumatic.     Right Ear: Ear canal and external ear normal.     Left Ear: Ear canal and external ear normal.     Nose: Nose normal. No rhinorrhea.     Mouth/Throat:     Mouth: Mucous membranes are moist.     Pharynx: Oropharynx is clear. No oropharyngeal exudate or posterior oropharyngeal erythema.  Eyes:     General: No scleral icterus.       Right eye: No discharge.        Left eye: No discharge.     Extraocular Movements: Extraocular movements intact.     Conjunctiva/sclera: Conjunctivae normal.     Pupils: Pupils are equal, round, and reactive to light.  Cardiovascular:     Rate and Rhythm: Normal rate and regular rhythm.     Heart sounds: No murmur heard. Pulmonary:     Effort: Pulmonary effort is normal. No respiratory distress.     Breath sounds: Normal breath sounds. No stridor. No wheezing, rhonchi or rales.  Abdominal:     Palpations: Abdomen is soft.     Tenderness: There is no abdominal tenderness.  Musculoskeletal:        General: No swelling.     Cervical back: Normal range of motion and neck supple.  Lymphadenopathy:     Cervical: No cervical adenopathy.  Skin:    General: Skin is warm and dry.     Capillary Refill: Capillary refill takes less than 2 seconds.     Coloration: Skin is not jaundiced.     Findings: Rash (generalized hives, worse to posterior upper arms near triceps and scalp.) present. No erythema.  Neurological:     General: No focal deficit present.     Mental Status: She is alert and oriented to person, place, and time.  Psychiatric:        Mood and Affect: Mood normal.      UC Treatments / Results  Labs (all labs ordered are  listed, but only abnormal results are displayed) Labs Reviewed - No data to display  EKG   Radiology No results  found.  Procedures Procedures (including critical care time)  Medications Ordered in UC Medications  dexamethasone (DECADRON) injection 10 mg (10 mg Intramuscular Given 05/06/22 1533)    Initial Impression / Assessment and Plan / UC Course  I have reviewed the triage vital signs and the nursing notes.  Pertinent labs & imaging results that were available during my care of the patient were reviewed by me and considered in my medical decision making (see chart for details).     Allergic reaction to drug/ urticaria -patient has only been taking 10 mg of prednisone, which I feel is a subtherapeutic dose.  We will have her stop her current prednisone, Decadron injection given in office, will discharge home with 50 mg daily x5 days.  Must stop Wellbutrin. BMI 38 -continue phentermine, blood pressure and pulse rate controlled.  This is unlikely the cause of her symptoms.  Additional medications discussed that could replace the Wellbutrin.  Must follow-up with PCP for prescription.  Had discussion regarding fiber and protein to help maintain satiety. Consider RD referral for meal planning.   Final Clinical Impressions(s) / UC Diagnoses   Final diagnoses:  Allergic reaction to drug, initial encounter  Urticaria  BMI 38.0-38.9,adult     Discharge Instructions      Please stop taking the wellbutrin, I suspect this is the cause of your symptoms. Discussed other possible weight loss options with your PCP, including Topamax, Saxenda, low dose naltrexone (proven to help with carb craving). Belviq was taken off the market 3 years ago. To help curb hunger, increase your protein and fiber intake. Don't eat a carbohydrate without coupling with a good protein source.  You were given a shot of dexamethasone here. This should help with the hives. Start taking 50mg  (ONE pill) prednisone every morning for the next 5 days. I have also sent in hydroxyzine to help with the itching. Take every 6-8 hours as  needed. This may cause drowsiness. If hives persist, or if you develop shortness of breath, swelling, wheezing, please RTC or head to ER.    ED Prescriptions     Medication Sig Dispense Auth. Provider   predniSONE (DELTASONE) 50 MG tablet Take 1 tablet (50 mg total) by mouth daily with breakfast. 5 tablet Cammi Consalvo L, PA   hydrOXYzine (ATARAX) 25 MG tablet Take 1 tablet (25 mg total) by mouth every 6 (six) hours. 12 tablet [place] Droll L,      PDMP not reviewed this encounter.   [place], Maretta Bees 05/06/22 1645

## 2022-05-06 NOTE — Telephone Encounter (Signed)
Call to Jaylynn to give her the contact information for [facility]. Pt had stated she had needed a follow up for a bladder prolapse. Dwayne to send a request via My Chart to her PCP.

## 2022-05-06 NOTE — Discharge Instructions (Addendum)
Please stop taking the wellbutrin, I suspect this is the cause of your symptoms. Discussed other possible weight loss options with your PCP, including Topamax, Saxenda, low dose naltrexone (proven to help with carb craving). Belviq was taken off the market 3 years ago. To help curb hunger, increase your protein and fiber intake. Don't eat a carbohydrate without coupling with a good protein source.  You were given a shot of dexamethasone here. This should help with the hives. Start taking 50mg  (ONE pill) prednisone every morning for the next 5 days. I have also sent in hydroxyzine to help with the itching. Take every 6-8 hours as needed. This may cause drowsiness. If hives persist, or if you develop shortness of breath, swelling, wheezing, please RTC or head to ER.

## 2022-05-15 ENCOUNTER — Encounter: Payer: Self-pay | Admitting: Family Medicine

## 2022-06-09 DIAGNOSIS — N819 Female genital prolapse, unspecified: Secondary | ICD-10-CM | POA: Diagnosis not present

## 2022-07-17 DIAGNOSIS — N393 Stress incontinence (female) (male): Secondary | ICD-10-CM | POA: Diagnosis not present

## 2022-07-20 DIAGNOSIS — N393 Stress incontinence (female) (male): Secondary | ICD-10-CM | POA: Diagnosis not present

## 2022-07-20 DIAGNOSIS — N819 Female genital prolapse, unspecified: Secondary | ICD-10-CM | POA: Diagnosis not present

## 2022-07-22 DIAGNOSIS — J018 Other acute sinusitis: Secondary | ICD-10-CM | POA: Diagnosis not present

## 2022-08-11 DIAGNOSIS — A499 Bacterial infection, unspecified: Secondary | ICD-10-CM | POA: Diagnosis not present

## 2022-09-10 ENCOUNTER — Other Ambulatory Visit: Payer: Self-pay | Admitting: Urology

## 2022-09-22 DIAGNOSIS — N819 Female genital prolapse, unspecified: Secondary | ICD-10-CM | POA: Diagnosis not present

## 2022-09-22 DIAGNOSIS — N393 Stress incontinence (female) (male): Secondary | ICD-10-CM | POA: Diagnosis not present

## 2022-10-01 NOTE — Progress Notes (Addendum)
COVID Vaccine received:  []  No [x]  Yes Date of any COVID positive Test in last 90 days: None  PCP - Luetta Nutting, DO Cardiologist - None  Chest x-ray - 2019 2v  epic EKG -  (2016 epic)  no history to warrant repeat Stress Test -  ECHO -  Cardiac Cath -   PCR screen: []  Ordered & Completed                      [x]   No Order but Needs PROFEND                      []   N/A for this surgery  Surgery Plan:  []  Ambulatory                            [x]  Outpatient in bed                            []  Admit  Anesthesia:    [x]  General  []  Spinal                           []   Choice []   MAC  Bowel Prep - [x]  No  []   Yes _____________  Pacemaker / ICD device [x]  No []  Yes        Device order form faxed [x]  No    []   Yes      Faxed to:  Spinal Cord Stimulator:[x]  No []  Yes      (Remind patient to bring remote DOS) Other Implants:   History of Sleep Apnea? [x]  No []  Yes   CPAP used?- [x]  No []  Yes    Does the patient monitor blood sugar? []  No []  Yes  [x]  N/A  Blood Thinner / Instructions:  none Aspirin Instructions: none  ERAS Protocol Ordered: [x]  No  []  Yes  Patient is to be NPO after: midnight prior  Comments:   Activity level: Patient can climb a flight of stairs without difficulty; [x]  No CP but would have SOB. Patient can perform ADLs without assistance.   Anesthesia review: no pertinent medical or surgical history.   Patient denies shortness of breath, fever, cough and chest pain at PAT appointment.  Patient verbalized understanding and agreement to the Pre-Surgical Instructions that were given to them at this PAT appointment. Patient was also educated of the need to review these PAT instructions again prior to his/her surgery.I reviewed the appropriate phone numbers to call if they have any and questions or concerns.

## 2022-10-01 NOTE — Patient Instructions (Addendum)
SURGICAL WAITING ROOM VISITATION Patients having surgery or a procedure may have no more than 2 support people in the waiting area - these visitors may rotate in the visitor waiting room.   Due to an increase in RSV and influenza rates and associated hospitalizations, children ages 5 and under may not visit patients in [place]. If the patient needs to stay at the hospital during part of their recovery, the visitor guidelines for inpatient rooms apply.  PRE-OP VISITATION  Pre-op nurse will coordinate an appropriate time for 1 support person to accompany the patient in pre-op.  This support person may not rotate.  This visitor will be contacted when the time is appropriate for the visitor to come back in the pre-op area.  Please refer to the [facility] website for the visitor guidelines for Inpatients (after your surgery is over and you are in a regular room).  You are not required to quarantine at this time prior to your surgery. However, you must do this: Hand Hygiene often Do NOT share personal items Notify your provider if you are in close contact with someone who has COVID or you develop fever 100.4 or greater, new onset of sneezing, cough, sore throat, shortness of breath or body aches.  If you test positive for Covid or have been in contact with anyone that has tested positive in the last 10 days please notify you surgeon.    Your procedure is scheduled on:  Friday  October 09, 2022   Report to [facility] Main Entrance: [address] entrance where the [employer] is available.   Report to admitting at:  05:15 AM  +++++Call this number if you have any questions or problems the morning of surgery [phone]  DO NOT EAT OR DRINK ANYTHING AFTER MIDNIGHT THE NIGHT PRIOR TO YOUR SURGERY / PROCEDURE.   FOLLOW BOWEL PREP AND ANY ADDITIONAL PRE OP INSTRUCTIONS YOU RECEIVED FROM YOUR SURGEON'S OFFICE!!!   Oral Hygiene is also important to reduce your risk of  infection.        Remember - BRUSH YOUR TEETH THE MORNING OF SURGERY WITH YOUR REGULAR TOOTHPASTE  Take ONLY these medicines the morning of surgery with A SIP OF WATER: none                    You may not have any metal on your body including hair pins, jewelry, and body piercing  Do not wear make-up, lotions, powders, perfumes or deodorant  Do not wear nail polish including gel and S&S, artificial / acrylic nails, or any other type of covering on natural nails including finger and toenails. If you have artificial nails, gel coating, etc., that needs to be removed by a nail salon, Please have this removed prior to surgery. Not doing so may mean that your surgery could be cancelled or delayed if the Surgeon or anesthesia staff feels like they are unable to monitor you safely.   Do not shave 48 hours prior to surgery to avoid nicks in your skin which may contribute to postoperative infections.   You may bring a small overnight bag with you on the day of surgery, only pack items that are not valuable. [place] IS NOT RESPONSIBLE   FOR VALUABLES THAT ARE LOST OR STOLEN.   Do not bring your home medications to the hospital. The Pharmacy will dispense medications listed on your medication list to you during your admission in the Hospital.  Please read over the following  fact sheets you were given: IF YOU HAVE QUESTIONS ABOUT YOUR PRE-OP INSTRUCTIONS, PLEASE CALL [phone]  (KAY)   [place] - Preparing for Surgery Before surgery, you can play an important role.  Because skin is not sterile, your skin needs to be as free of germs as possible.  You can reduce the number of germs on your skin by washing with CHG (chlorahexidine gluconate) soap before surgery.  CHG is an antiseptic cleaner which kills germs and bonds with the skin to continue killing germs even after washing. Please DO NOT use if you have an allergy to CHG or antibacterial soaps.  If your skin becomes reddened/irritated stop  using the CHG and inform your nurse when you arrive at Short Stay. Do not shave (including legs and underarms) for at least 48 hours prior to the first CHG shower.  You may shave your face/neck.  Please follow these instructions carefully:  1.  Shower with CHG Soap the night before surgery and the  morning of surgery.  2.  If you choose to wash your hair, wash your hair first as usual with your normal  shampoo.  3.  After you shampoo, rinse your hair and body thoroughly to remove the shampoo.                             4.  Use CHG as you would any other liquid soap.  You can apply chg directly to the skin and wash.  Gently with a scrungie or clean washcloth.  5.  Apply the CHG Soap to your body ONLY FROM THE NECK DOWN.   Do not use on face/ open                           Wound or open sores. Avoid contact with eyes, ears mouth and genitals (private parts).                       Wash face,  Genitals (private parts) with your normal soap.             6.  Wash thoroughly, paying special attention to the area where your  surgery  will be performed.  7.  Thoroughly rinse your body with warm water from the neck down.  8.  DO NOT shower/wash with your normal soap after using and rinsing off the CHG Soap.            9.  Pat yourself dry with a clean towel.            10.  Wear clean pajamas.            11.  Place clean sheets on your bed the night of your first shower and do not  sleep with pets.  ON THE DAY OF SURGERY : Do not apply any lotions/deodorants the morning of surgery.  Please wear clean clothes to the hospital/surgery center.    FAILURE TO FOLLOW THESE INSTRUCTIONS MAY RESULT IN THE CANCELLATION OF YOUR SURGERY  PATIENT SIGNATURE_________________________________  NURSE SIGNATURE__________________________________  ________________________________________________________________________

## 2022-10-02 ENCOUNTER — Encounter (HOSPITAL_COMMUNITY): Payer: Self-pay

## 2022-10-02 ENCOUNTER — Other Ambulatory Visit: Payer: Self-pay

## 2022-10-02 ENCOUNTER — Encounter (HOSPITAL_COMMUNITY)
Admission: RE | Admit: 2022-10-02 | Discharge: 2022-10-02 | Disposition: A | Discharge status: Home / Self Care | Payer: BC Managed Care – PPO | Source: Ambulatory Visit | Attending: Urology | Admitting: Urology

## 2022-10-02 VITALS — BP 134/88 | Temp 97.9°F | Resp 16 | Ht 62.0 in | Wt 210.0 lb

## 2022-10-02 DIAGNOSIS — Z01812 Encounter for preprocedural laboratory examination: Secondary | ICD-10-CM | POA: Insufficient documentation

## 2022-10-02 DIAGNOSIS — Z01818 Encounter for other preprocedural examination: Secondary | ICD-10-CM

## 2022-10-02 HISTORY — DX: Other specified health status: Z78.9

## 2022-10-02 LAB — BASIC METABOLIC PANEL
Anion gap: 7 (ref 5–15)
BUN: 14 mg/dL (ref 6–20)
CO2: 25 mmol/L (ref 22–32)
Calcium: 8.9 mg/dL (ref 8.9–10.3)
Chloride: 102 mmol/L (ref 98–111)
Creatinine, Ser: 0.62 mg/dL (ref 0.44–1.00)
GFR, Estimated: >60 mL/min (ref >60)
Glucose, Bld: 91 mg/dL (ref 70–99)
Potassium: 3.6 mmol/L (ref 3.5–5.1)
Sodium: 134 mmol/L — ABNORMAL LOW (ref 135–145)

## 2022-10-02 LAB — CBC
HCT: 42.9 % (ref 36.0–46.0)
Hemoglobin: 13.6 g/dL (ref 12.0–15.0)
MCH: 28.4 pg (ref 26.0–34.0)
MCHC: 31.7 g/dL (ref 30.0–36.0)
MCV: 89.6 fL (ref 80.0–100.0)
Platelets: 280 10*3/uL (ref 150–400)
RBC: 4.79 MIL/uL (ref 3.87–5.11)
RDW: 12.5 % (ref 11.5–15.5)
WBC: 7.7 10*3/uL (ref 4.0–10.5)
nRBC: 0 % (ref 0.0–0.2)

## 2022-10-03 LAB — URINE CULTURE

## 2022-10-08 NOTE — Anesthesia Preprocedure Evaluation (Addendum)
Anesthesia Evaluation  Patient identified by MRN, date of birth, ID band Patient awake    Reviewed: Allergy & Precautions, NPO status , Patient's Chart, lab work & pertinent test results  Airway Mallampati: II  TM Distance: >3 FB Neck ROM: Full    Dental  (+) Dental Advisory Given, Missing   Pulmonary neg pulmonary ROS   Pulmonary exam normal breath sounds clear to auscultation       Cardiovascular negative cardio ROS Normal cardiovascular exam Rhythm:Regular Rate:Normal     Neuro/Psych  PSYCHIATRIC DISORDERS Anxiety Depression    Spondylisthesis    GI/Hepatic negative GI ROS, Neg liver ROS,,,  Endo/Other  negative endocrine ROS  Obesity   Renal/GU negative Renal ROS   PELVIC ORGAN PROLASPE/STRESS URINARY INCONTINENCE    Musculoskeletal negative musculoskeletal ROS (+)    Abdominal   Peds  Hematology negative hematology ROS (+)   Anesthesia Other Findings   Reproductive/Obstetrics                             Anesthesia Physical Anesthesia Plan  ASA: 2  Anesthesia Plan: General   Post-op Pain Management: Tylenol PO (pre-op)*   Induction: Intravenous  PONV Risk Score and Plan: 4 or greater and Midazolam, Scopolamine patch - Pre-op, Dexamethasone and Ondansetron  Airway Management Planned: Oral ETT  Additional Equipment:   Intra-op Plan:   Post-operative Plan: Extubation in OR  Informed Consent: I have reviewed the patients History and Physical, chart, labs and discussed the procedure including the risks, benefits and alternatives for the proposed anesthesia with the patient or authorized representative who has indicated his/her understanding and acceptance.     Dental advisory given  Plan Discussed with: CRNA  Anesthesia Plan Comments: (2nd PIV after induction )       Anesthesia Quick Evaluation

## 2022-10-09 ENCOUNTER — Encounter (HOSPITAL_COMMUNITY): Payer: Self-pay | Admitting: Urology

## 2022-10-09 ENCOUNTER — Other Ambulatory Visit: Payer: Self-pay

## 2022-10-09 ENCOUNTER — Ambulatory Visit (HOSPITAL_COMMUNITY): Payer: BC Managed Care – PPO | Admitting: Certified Registered Nurse Anesthetist

## 2022-10-09 ENCOUNTER — Ambulatory Visit (HOSPITAL_COMMUNITY)
Admission: RE | Admit: 2022-10-09 | Discharge: 2022-10-10 | Disposition: A | Discharge status: Home / Self Care | Payer: BC Managed Care – PPO | Attending: Urology | Admitting: Urology

## 2022-10-09 ENCOUNTER — Encounter (HOSPITAL_COMMUNITY)
Admission: RE | Disposition: A | Discharge status: Home / Self Care | Payer: Self-pay | Source: Home / Self Care | Attending: Urology

## 2022-10-09 DIAGNOSIS — N393 Stress incontinence (female) (male): Secondary | ICD-10-CM | POA: Insufficient documentation

## 2022-10-09 DIAGNOSIS — E669 Obesity, unspecified: Secondary | ICD-10-CM | POA: Diagnosis not present

## 2022-10-09 DIAGNOSIS — N8189 Other female genital prolapse: Secondary | ICD-10-CM | POA: Diagnosis not present

## 2022-10-09 DIAGNOSIS — N8110 Cystocele, unspecified: Secondary | ICD-10-CM | POA: Diagnosis not present

## 2022-10-09 DIAGNOSIS — N736 Female pelvic peritoneal adhesions (postinfective): Secondary | ICD-10-CM | POA: Insufficient documentation

## 2022-10-09 DIAGNOSIS — N819 Female genital prolapse, unspecified: Secondary | ICD-10-CM | POA: Insufficient documentation

## 2022-10-09 DIAGNOSIS — K5900 Constipation, unspecified: Secondary | ICD-10-CM | POA: Insufficient documentation

## 2022-10-09 DIAGNOSIS — N814 Uterovaginal prolapse, unspecified: Secondary | ICD-10-CM | POA: Diagnosis not present

## 2022-10-09 DIAGNOSIS — Z6838 Body mass index (BMI) 38.0-38.9, adult: Secondary | ICD-10-CM | POA: Insufficient documentation

## 2022-10-09 HISTORY — PX: ROBOTIC ASSISTED LAPAROSCOPIC SACROCOLPOPEXY: SHX5388

## 2022-10-09 HISTORY — PX: CYSTOSCOPY: SHX5120

## 2022-10-09 HISTORY — PX: ROBOTIC ASSISTED LAPAROSCOPIC HYSTERECTOMY AND SALPINGECTOMY: SHX6379

## 2022-10-09 HISTORY — PX: PUBOVAGINAL SLING: SHX1035

## 2022-10-09 LAB — HEMOGLOBIN AND HEMATOCRIT, BLOOD
HCT: 40.9 % (ref 36.0–46.0)
Hemoglobin: 13.1 g/dL (ref 12.0–15.0)

## 2022-10-09 LAB — TYPE AND SCREEN
ABO/RH(D): B POS
Antibody Screen: NEGATIVE

## 2022-10-09 LAB — POCT PREGNANCY, URINE: Preg Test, Ur: NEGATIVE

## 2022-10-09 SURGERY — XI ROBOTIC ASSISTED LAPAROSCOPIC HYSTERECTOMY AND SALPINGECTOMY
Anesthesia: General

## 2022-10-09 MED ORDER — ROCURONIUM BROMIDE 10 MG/ML (PF) SYRINGE
PREFILLED_SYRINGE | INTRAVENOUS | Status: DC | PRN
  Administered 2022-10-09: 30 mg via INTRAVENOUS
  Administered 2022-10-09: 20 mg via INTRAVENOUS
  Administered 2022-10-09: 70 mg via INTRAVENOUS
  Administered 2022-10-09: 20 mg via INTRAVENOUS

## 2022-10-09 MED ORDER — MIDAZOLAM HCL 2 MG/2ML IJ SOLN
INTRAMUSCULAR | Status: AC
  Filled 2022-10-09: qty 2

## 2022-10-09 MED ORDER — PHENYLEPHRINE HCL-NACL 20-0.9 MG/250ML-% IV SOLN
INTRAVENOUS | Status: AC
  Filled 2022-10-09: qty 250

## 2022-10-09 MED ORDER — ESTRADIOL 0.1 MG/GM VA CREA
TOPICAL_CREAM | VAGINAL | Status: AC
  Filled 2022-10-09: qty 42.5

## 2022-10-09 MED ORDER — ACETAMINOPHEN 500 MG PO TABS
1000.0000 mg | ORAL_TABLET | Freq: Once | ORAL | Status: AC
  Administered 2022-10-09: 1000 mg via ORAL
  Filled 2022-10-09: qty 2

## 2022-10-09 MED ORDER — FENTANYL CITRATE (PF) 100 MCG/2ML IJ SOLN
INTRAMUSCULAR | Status: AC
  Filled 2022-10-09: qty 2

## 2022-10-09 MED ORDER — PROPOFOL 10 MG/ML IV BOLUS
INTRAVENOUS | Status: DC | PRN
  Administered 2022-10-09: 150 mg via INTRAVENOUS

## 2022-10-09 MED ORDER — FENTANYL CITRATE PF 50 MCG/ML IJ SOSY
PREFILLED_SYRINGE | INTRAMUSCULAR | Status: AC
  Administered 2022-10-09: 50 ug via INTRAVENOUS
  Filled 2022-10-09: qty 2

## 2022-10-09 MED ORDER — KETAMINE HCL 10 MG/ML IJ SOLN
INTRAMUSCULAR | Status: AC
  Filled 2022-10-09: qty 1

## 2022-10-09 MED ORDER — STERILE WATER FOR IRRIGATION IR SOLN
Status: DC | PRN
  Administered 2022-10-09: 1000 mL

## 2022-10-09 MED ORDER — ROCURONIUM BROMIDE 10 MG/ML (PF) SYRINGE
PREFILLED_SYRINGE | INTRAVENOUS | Status: AC
  Filled 2022-10-09: qty 10

## 2022-10-09 MED ORDER — PROMETHAZINE HCL 25 MG/ML IJ SOLN
6.2500 mg | INTRAMUSCULAR | Status: DC | PRN
  Administered 2022-10-09: 12.5 mg via INTRAVENOUS

## 2022-10-09 MED ORDER — LACTATED RINGERS IR SOLN
Status: DC | PRN
  Administered 2022-10-09: 3000 mL

## 2022-10-09 MED ORDER — ORAL CARE MOUTH RINSE: 15.0000 mL | Freq: Once | OROMUCOSAL | Status: AC

## 2022-10-09 MED ORDER — SCOPOLAMINE 1 MG/3DAYS TD PT72
1.0000 | MEDICATED_PATCH | Freq: Once | TRANSDERMAL | Status: DC
  Administered 2022-10-09: 1.5 mg via TRANSDERMAL
  Filled 2022-10-09: qty 1

## 2022-10-09 MED ORDER — FENTANYL CITRATE (PF) 100 MCG/2ML IJ SOLN
INTRAMUSCULAR | Status: DC | PRN
  Administered 2022-10-09 (×8): 50 ug via INTRAVENOUS

## 2022-10-09 MED ORDER — ESTRADIOL 0.1 MG/GM VA CREA
TOPICAL_CREAM | VAGINAL | Status: DC | PRN
  Administered 2022-10-09: 1 via VAGINAL

## 2022-10-09 MED ORDER — DOCUSATE SODIUM 100 MG PO CAPS: 100.0000 mg | ORAL_CAPSULE | Freq: Two times a day (BID) | ORAL | Status: DC

## 2022-10-09 MED ORDER — PHENYLEPHRINE 80 MCG/ML (10ML) SYRINGE FOR IV PUSH (FOR BLOOD PRESSURE SUPPORT)
PREFILLED_SYRINGE | INTRAVENOUS | Status: DC | PRN
  Administered 2022-10-09: 80 ug via INTRAVENOUS

## 2022-10-09 MED ORDER — DIPHENHYDRAMINE HCL 50 MG/ML IJ SOLN: 12.5000 mg | Freq: Four times a day (QID) | INTRAMUSCULAR | Status: DC | PRN

## 2022-10-09 MED ORDER — TRAMADOL HCL 50 MG PO TABS: 50.0000 mg | ORAL_TABLET | Freq: Four times a day (QID) | ORAL | 0 refills | Status: DC | PRN

## 2022-10-09 MED ORDER — MIDAZOLAM HCL 5 MG/5ML IJ SOLN
INTRAMUSCULAR | Status: DC | PRN
  Administered 2022-10-09: 2 mg via INTRAVENOUS

## 2022-10-09 MED ORDER — PROMETHAZINE HCL 25 MG/ML IJ SOLN
INTRAMUSCULAR | Status: AC
  Filled 2022-10-09: qty 1

## 2022-10-09 MED ORDER — CEFAZOLIN SODIUM-DEXTROSE 2-4 GM/100ML-% IV SOLN
INTRAVENOUS | Status: AC
  Filled 2022-10-09: qty 100

## 2022-10-09 MED ORDER — CIPROFLOXACIN IN D5W 400 MG/200ML IV SOLN
400.0000 mg | INTRAVENOUS | Status: AC
  Administered 2022-10-09: 400 mg via INTRAVENOUS
  Filled 2022-10-09: qty 200

## 2022-10-09 MED ORDER — SUGAMMADEX SODIUM 200 MG/2ML IV SOLN
INTRAVENOUS | Status: DC | PRN
  Administered 2022-10-09: 200 mg via INTRAVENOUS

## 2022-10-09 MED ORDER — CEFAZOLIN SODIUM-DEXTROSE 2-4 GM/100ML-% IV SOLN
2.0000 g | INTRAVENOUS | Status: AC
  Administered 2022-10-09 (×2): 2 g via INTRAVENOUS
  Filled 2022-10-09: qty 100

## 2022-10-09 MED ORDER — BUPIVACAINE HCL (PF) 0.5 % IJ SOLN
INTRAMUSCULAR | Status: DC | PRN
  Administered 2022-10-09: 19 mL

## 2022-10-09 MED ORDER — ONDANSETRON HCL 4 MG/2ML IJ SOLN: 4.0000 mg | INTRAMUSCULAR | Status: DC | PRN

## 2022-10-09 MED ORDER — BUPIVACAINE HCL (PF) 0.5 % IJ SOLN
INTRAMUSCULAR | Status: AC
  Filled 2022-10-09: qty 30

## 2022-10-09 MED ORDER — SULFAMETHOXAZOLE-TRIMETHOPRIM 800-160 MG PO TABS: 1.0000 | ORAL_TABLET | Freq: Two times a day (BID) | ORAL | 0 refills | Status: DC

## 2022-10-09 MED ORDER — ONDANSETRON HCL 4 MG/2ML IJ SOLN
INTRAMUSCULAR | Status: AC
  Filled 2022-10-09: qty 2

## 2022-10-09 MED ORDER — TRAMADOL HCL 50 MG PO TABS
50.0000 mg | ORAL_TABLET | Freq: Four times a day (QID) | ORAL | Status: DC | PRN
  Administered 2022-10-09 – 2022-10-10 (×3): 100 mg via ORAL
  Filled 2022-10-09 (×3): qty 2

## 2022-10-09 MED ORDER — HYDROMORPHONE HCL 1 MG/ML IJ SOLN
0.5000 mg | INTRAMUSCULAR | Status: DC | PRN
  Administered 2022-10-09: 1 mg via INTRAVENOUS
  Filled 2022-10-09: qty 1

## 2022-10-09 MED ORDER — LACTATED RINGERS IV SOLN: INTRAVENOUS | Status: DC

## 2022-10-09 MED ORDER — HYOSCYAMINE SULFATE 0.125 MG SL SUBL: 0.1250 mg | SUBLINGUAL_TABLET | SUBLINGUAL | Status: DC | PRN

## 2022-10-09 MED ORDER — SODIUM CHLORIDE (PF) 0.9 % IJ SOLN
INTRAMUSCULAR | Status: DC | PRN
  Administered 2022-10-09: 10 mL

## 2022-10-09 MED ORDER — DOCUSATE SODIUM 100 MG PO CAPS
100.0000 mg | ORAL_CAPSULE | Freq: Two times a day (BID) | ORAL | Status: DC
  Administered 2022-10-09 – 2022-10-10 (×2): 100 mg via ORAL
  Filled 2022-10-09 (×2): qty 1

## 2022-10-09 MED ORDER — PROPOFOL 10 MG/ML IV BOLUS
INTRAVENOUS | Status: AC
  Filled 2022-10-09: qty 20

## 2022-10-09 MED ORDER — ACETAMINOPHEN 10 MG/ML IV SOLN
1000.0000 mg | Freq: Four times a day (QID) | INTRAVENOUS | Status: AC
  Administered 2022-10-09 – 2022-10-10 (×4): 1000 mg via INTRAVENOUS
  Filled 2022-10-09 (×4): qty 100

## 2022-10-09 MED ORDER — FENTANYL CITRATE PF 50 MCG/ML IJ SOSY
25.0000 ug | PREFILLED_SYRINGE | INTRAMUSCULAR | Status: DC | PRN
  Administered 2022-10-09: 50 ug via INTRAVENOUS

## 2022-10-09 MED ORDER — KETAMINE HCL 10 MG/ML IJ SOLN
INTRAMUSCULAR | Status: DC | PRN
  Administered 2022-10-09: 10 mg via INTRAVENOUS
  Administered 2022-10-09 (×2): 20 mg via INTRAVENOUS

## 2022-10-09 MED ORDER — LACTATED RINGERS IV SOLN: INTRAVENOUS | Status: DC | PRN

## 2022-10-09 MED ORDER — CIPROFLOXACIN IN D5W 400 MG/200ML IV SOLN
400.0000 mg | Freq: Two times a day (BID) | INTRAVENOUS | Status: AC
  Administered 2022-10-09: 400 mg via INTRAVENOUS
  Filled 2022-10-09: qty 200

## 2022-10-09 MED ORDER — SODIUM CHLORIDE 0.45 % IV SOLN: INTRAVENOUS | Status: DC

## 2022-10-09 MED ORDER — LIDOCAINE HCL (PF) 2 % IJ SOLN
INTRAMUSCULAR | Status: AC
  Filled 2022-10-09: qty 5

## 2022-10-09 MED ORDER — DEXAMETHASONE SODIUM PHOSPHATE 10 MG/ML IJ SOLN
INTRAMUSCULAR | Status: AC
  Filled 2022-10-09: qty 1

## 2022-10-09 MED ORDER — FENTANYL CITRATE PF 50 MCG/ML IJ SOSY
PREFILLED_SYRINGE | INTRAMUSCULAR | Status: AC
  Administered 2022-10-09: 25 ug via INTRAVENOUS
  Filled 2022-10-09: qty 1

## 2022-10-09 MED ORDER — KETOROLAC TROMETHAMINE 30 MG/ML IJ SOLN
30.0000 mg | Freq: Four times a day (QID) | INTRAMUSCULAR | Status: DC
  Administered 2022-10-09 – 2022-10-10 (×4): 30 mg via INTRAVENOUS
  Filled 2022-10-09 (×4): qty 1

## 2022-10-09 MED ORDER — CHLORHEXIDINE GLUCONATE 0.12 % MT SOLN
15.0000 mL | Freq: Once | OROMUCOSAL | Status: AC
  Administered 2022-10-09: 15 mL via OROMUCOSAL

## 2022-10-09 MED ORDER — SODIUM CHLORIDE (PF) 0.9 % IJ SOLN
INTRAMUSCULAR | Status: AC
  Filled 2022-10-09: qty 10

## 2022-10-09 MED ORDER — ONDANSETRON HCL 4 MG/2ML IJ SOLN
INTRAMUSCULAR | Status: DC | PRN
  Administered 2022-10-09: 4 mg via INTRAVENOUS

## 2022-10-09 MED ORDER — LIDOCAINE 2% (20 MG/ML) 5 ML SYRINGE
INTRAMUSCULAR | Status: DC | PRN
  Administered 2022-10-09: 80 mg via INTRAVENOUS

## 2022-10-09 MED ORDER — DIPHENHYDRAMINE HCL 12.5 MG/5ML PO ELIX: 12.5000 mg | ORAL_SOLUTION | Freq: Four times a day (QID) | ORAL | Status: DC | PRN

## 2022-10-09 MED ORDER — BUPIVACAINE LIPOSOME 1.3 % IJ SUSP
INTRAMUSCULAR | Status: DC | PRN
  Administered 2022-10-09: 20 mL

## 2022-10-09 MED ORDER — BUPIVACAINE LIPOSOME 1.3 % IJ SUSP
INTRAMUSCULAR | Status: AC
  Filled 2022-10-09: qty 20

## 2022-10-09 MED ORDER — DEXAMETHASONE SODIUM PHOSPHATE 10 MG/ML IJ SOLN
INTRAMUSCULAR | Status: DC | PRN
  Administered 2022-10-09: 5 mg via INTRAVENOUS

## 2022-10-09 SURGICAL SUPPLY — 90 items
BAG COUNTER SPONGE SURGICOUNT (BAG) IMPLANT
BAG LAPAROSCOPIC 12 15 PORT 16 (BASKET) IMPLANT
BAG RETRIEVAL 12/15 (BASKET) ×2
BAG URINE DRAIN 2000ML AR STRL (UROLOGICAL SUPPLIES) IMPLANT
BLADE HEX COATED 2.75 (ELECTRODE) ×2 IMPLANT
BLADE SURG 15 STRL LF DISP TIS (BLADE) ×4 IMPLANT
BLADE SURG 15 STRL SS (BLADE) ×4
BRIEF MESH DISP LRG (UNDERPADS AND DIAPERS) IMPLANT
CATH FOLEY 2WAY SLVR  5CC 16FR (CATHETERS) ×2
CATH FOLEY 2WAY SLVR 5CC 16FR (CATHETERS) ×2 IMPLANT
CHLORAPREP W/TINT 26 (MISCELLANEOUS) ×2 IMPLANT
CLIP LIGATING HEM O LOK PURPLE (MISCELLANEOUS) ×2 IMPLANT
CLIP LIGATING HEMO LOK XL GOLD (MISCELLANEOUS) IMPLANT
CLIP LIGATING HEMO O LOK GREEN (MISCELLANEOUS) IMPLANT
COVER BACK TABLE 60X90IN (DRAPES) ×2 IMPLANT
COVER SURGICAL LIGHT HANDLE (MISCELLANEOUS) ×2 IMPLANT
COVER TIP SHEARS 8 DVNC (MISCELLANEOUS) ×2 IMPLANT
COVER TIP SHEARS 8MM DA VINCI (MISCELLANEOUS) ×2
DERMABOND ADVANCED .7 DNX12 (GAUZE/BANDAGES/DRESSINGS) ×2 IMPLANT
DRAIN CHANNEL RND F F (WOUND CARE) IMPLANT
DRAPE ARM DVNC X/XI (DISPOSABLE) ×8 IMPLANT
DRAPE COLUMN DVNC XI (DISPOSABLE) ×2 IMPLANT
DRAPE DA VINCI XI ARM (DISPOSABLE) ×8
DRAPE DA VINCI XI COLUMN (DISPOSABLE) ×2
DRAPE INCISE IOBAN 66X45 STRL (DRAPES) ×2 IMPLANT
DRAPE SHEET LG 3/4 BI-LAMINATE (DRAPES) ×4 IMPLANT
DRAPE SURG IRRIG POUCH 19X23 (DRAPES) ×2 IMPLANT
ELECT PENCIL ROCKER SW 15FT (MISCELLANEOUS) ×2 IMPLANT
ELECT REM PT RETURN 15FT ADLT (MISCELLANEOUS) ×2 IMPLANT
GAUZE 4X4 16PLY ~~LOC~~+RFID DBL (SPONGE) ×4 IMPLANT
GAUZE STRIP PACKING 2INX5YD (MISCELLANEOUS) ×2 IMPLANT
GLOVE BIO SURGEON STRL SZ 6.5 (GLOVE) ×2 IMPLANT
GLOVE BIOGEL PI IND STRL 8 (GLOVE) ×2 IMPLANT
GLOVE SURG LX STRL 7.5 STRW (GLOVE) ×6 IMPLANT
GOWN SRG XL LVL 4 BRTHBL STRL (GOWNS) ×2 IMPLANT
GOWN STRL NON-REIN XL LVL4 (GOWNS) ×2
GOWN STRL REUS W/ TWL XL LVL3 (GOWN DISPOSABLE) ×4 IMPLANT
GOWN STRL REUS W/TWL XL LVL3 (GOWN DISPOSABLE) ×4
HOLDER FOLEY CATH W/STRAP (MISCELLANEOUS) ×2 IMPLANT
IRRIG SUCT STRYKERFLOW 2 WTIP (MISCELLANEOUS) ×2
IRRIGATION SUCT STRKRFLW 2 WTP (MISCELLANEOUS) ×2 IMPLANT
KIT BASIN OR (CUSTOM PROCEDURE TRAY) ×2 IMPLANT
KIT TURNOVER KIT A (KITS) IMPLANT
MANIPULATOR UTERINE 4.5 ZUMI (MISCELLANEOUS) IMPLANT
MARKER SKIN DUAL TIP RULER LAB (MISCELLANEOUS) ×2 IMPLANT
MESH Y UPSYLON VAGINAL (Mesh General) IMPLANT
NEEDLE HYPO 22GX1.5 SAFETY (NEEDLE) IMPLANT
NS IRRIG 1000ML POUR BTL (IV SOLUTION) ×2 IMPLANT
OCCLUDER COLPOPNEUMO (BALLOONS) IMPLANT
PACKING VAGINAL (PACKING) IMPLANT
PAD OB MATERNITY 4.3X12.25 (PERSONAL CARE ITEMS) IMPLANT
PAD POSITIONING PINK XL (MISCELLANEOUS) ×2 IMPLANT
PLUG CATH AND CAP STER (CATHETERS) ×2 IMPLANT
RETRACTOR LONRSTAR 16.6X16.6CM (MISCELLANEOUS) ×2 IMPLANT
RETRACTOR STAY HOOK 5MM (MISCELLANEOUS) ×2 IMPLANT
RETRACTOR STER APS 16.6X16.6CM (MISCELLANEOUS) ×2
SEAL CANN UNIV 5-8 DVNC XI (MISCELLANEOUS) ×8 IMPLANT
SEAL XI 5MM-8MM UNIVERSAL (MISCELLANEOUS) ×8
SET IRRIG Y TYPE TUR BLADDER L (SET/KITS/TRAYS/PACK) IMPLANT
SET TUBE SMOKE EVAC HIGH FLOW (TUBING) ×2 IMPLANT
SHEET LAVH (DRAPES) ×2 IMPLANT
SLING LYNX SUPRAPUBIC (Sling) ×2 IMPLANT
SOL ELECTROSURG ANTI STICK (MISCELLANEOUS) ×2
SOL PREP POV-IOD 4OZ 10% (MISCELLANEOUS) IMPLANT
SOLUTION ELECTROSURG ANTI STCK (MISCELLANEOUS) ×2 IMPLANT
SPIKE FLUID TRANSFER (MISCELLANEOUS) ×2 IMPLANT
SURGILUBE 2OZ TUBE FLIPTOP (MISCELLANEOUS) IMPLANT
SUT MNCRL AB 4-0 PS2 18 (SUTURE) ×4 IMPLANT
SUT PROLENE 2 0 CT 1 (SUTURE) ×2 IMPLANT
SUT VIC AB 0 CT1 27 (SUTURE) ×2
SUT VIC AB 0 CT1 27XBRD ANTBC (SUTURE) ×2 IMPLANT
SUT VIC AB 2-0 CT2 27 (SUTURE) IMPLANT
SUT VIC AB 2-0 SH 27 (SUTURE) ×12
SUT VIC AB 2-0 SH 27XBRD (SUTURE) ×10 IMPLANT
SUT VIC AB 2-0 UR6 27 (SUTURE) ×2 IMPLANT
SUT VIC AB 3-0 SH 27 (SUTURE) ×2
SUT VIC AB 3-0 SH 27X BRD (SUTURE) ×2 IMPLANT
SUT VICRYL 0 UR6 27IN ABS (SUTURE) ×2 IMPLANT
SUT VLOC 180 3-0 9IN GS21 (SUTURE) IMPLANT
SYR 10ML LL (SYRINGE) ×2 IMPLANT
SYR 50ML LL SCALE MARK (SYRINGE) IMPLANT
SYS BAG RETRIEVAL 10MM (BASKET)
SYSTEM BAG RETRIEVAL 10MM (BASKET) IMPLANT
TOWEL OR 17X26 10 PK STRL BLUE (TOWEL DISPOSABLE) ×2 IMPLANT
TRAY LAPAROSCOPIC (CUSTOM PROCEDURE TRAY) ×2 IMPLANT
TROCAR Z THREAD OPTICAL 12X100 (TROCAR) ×2 IMPLANT
TROCAR Z-THREAD FIOS 5X100MM (TROCAR) ×2 IMPLANT
TUBING CONNECTING 10 (TUBING) ×2 IMPLANT
WATER STERILE IRR 1000ML POUR (IV SOLUTION) ×2 IMPLANT
WATER STERILE IRR 500ML POUR (IV SOLUTION) ×2 IMPLANT

## 2022-10-09 NOTE — Discharge Instructions (Signed)

## 2022-10-09 NOTE — Op Note (Signed)
Preoperative diagnosis:   Pelvic organ prolapse  Postoperative diagnosis:   Same  Procedure: Robotic-assisted laparoscopic supracervical hysterectomy and  bilateral salpingectomy (bilateral ovarian sparing) Robotic-assisted laparoscopic sacrocolpopexy Mid-urethral sling cystoscopy  Surgeon: Ardis Hughs, MD First assistant: Debbrah Alar, PA-C  An assistant was required for this surgical procedure.  The duties of the assistant included but were not limited to suctioning, passing suture, camera manipulation, retraction. This procedure would not be able to be performed without an Environmental consultant.   Anesthesia: General  Complications: None  Intraoperative findings:  [employer] Y mesh used for the sacrocolpopexy.  EBL: 250 mL  Specimens: Uterus and proximal cervix, bilateral fallopian tubes and ovaries  Indication: Courtney Costa is a 47 y.o. female patient with symptomatic pelvic organ prolapse.    After reviewing the management options for treatment, she elected to proceed with the above surgical procedure(s). We have discussed the potential benefits and risks of the procedure, side effects of the proposed treatment, the likelihood of the patient achieving the goals of the procedure, and any potential problems that might occur during the procedure or recuperation. Informed consent has been obtained.  Description of procedure:  The patient was taken to the operating room and general anesthesia was induced.  The patient was placed in the dorsal lithotomy position, prepped and draped in the usual sterile fashion, and preoperative antibiotics were administered. A preoperative time-out was performed.    A Foley catheter was then placed and placed to gravity drainage. I then made a periumbilical incision carrying the dissection down to the patient's fascia with electrocautery.  Once to the fascia, the fascia was incised and a small puncture hole made in the  peritoneum to allow passage of a 98mm port.   The abdomen was insufflated and the remaining ports placed under digital guidance.  2 ports were placed lateral to the umbilicus on the right proximally 10 cm apart.  The most lateral port was approximately 3 cm above the anterior iliac spine.  2 additional ports were placed in the patient's right side in comparable positions to the most lateral port on the right was a 12 mm port.the robot was then docked at an angle from the leg obliquely along the side of the left leg.  We then began our surgery by cleaning up some of the pelvic adhesions to the small bowel and colon.  Once this was completed I started dissecting at the sacral promontory located 3 cm medial to the location where the ureter crosses over the right iliac vessels at the pelvic brim. The posterior peritoneum was incised and the sacral prominence cleared off an area taking care to avoid the middle sacral vessels and the iliac branches.  I then created a posterior peritoneal tunnel starting at the sacral promontory and tunneling down the right pelvic sidewall down into the pelvis breaking back through the posterior peritoneum around the vesico-vaginal junction posteriorly.  I then continued the posterior dissection retracting down on the rectum and finding the avascular plane between the posterior vaginal wall and the rectum.  I carried this dissection down as far as I could to along the area of the perineal body.  Then focused my attention to the uterus and hysterectomy.  I first started by taking the right round ligament with a series of by polar cautery.  I then dissected the anterior leaf of the broad ligament slightly more proximal and then distally down across the anterior mucosa to the salpinx and the internal cervical  os.  I then dissected free the right salpinx, sparing the ovary. Once the anterior leaf of the broad ligament had been completely dissected on the patient's right and a small bladder  flap had been created anteriorly attention was turned to the left side where a similar dissection was carried out. I then turned my attention to the anterior plane between the anterior vaginal wall and the bladder.  I was able to obtain access to the avascular plane and with a combination of both monopolar cautery and blunt dissection was able to get down to the bladder neck.  I then turned my attention back to the patient's uterus and skeletonized the right uterine artery and vein and then took this with a series of bipolar moves.  I then performed a similar uterus pedicle ligation on the left.  At this point I was able to identify the patient's cervix and came through supracervical with monopolar cautery once the uterus was freed from all its attachments it was pushed into the left paracolic gutter and our attention was turned to placing the mesh.  Mesh was measured at approximately 7.5 cm anteriorly and 7.5 cm posteriorly and I cut this on the back table.  The mesh was then placed into the patient's abdomen through the assistant port and the anterior leaf was secured down onto the anterior vaginal wall with the apex at the bladder neck.  The posterior leaf was then secured down on the posterior vaginal wall.  These were sewn down with 2-0 Vicryl.  Between 6 and 8 were done on each side.  At this point I then went back to the previously dissected sacral promontory and posterior peritoneal tunnel and inserted a instrument through the tunnel and grasped the end of the mesh at the vaginal cuff and pull it up to the sacrum.  I then checked to ensure that the sacral mesh was not too tight by performing a vaginal exam.  I then secured the sacral leg of the mesh using a 0 Prolene.  I then reapproximated the posterior peritoneum with a 2-0 Vicryl in a running fashion around the sacral promontory.  The pelvic peritoneum was closed using a pursestring.  A small Endo Catch bag was then gently passed through the assistant  port and the uterus was placed in the bag.  The bag was then brought out through the camera port once the trochars were removed.  We then made a slightly larger extraction incision to remove the uterus.  The fascia was then closed with 0 Vicryl in a figure-of-eight fashion.  The skin was closed with 4-0 Monocryl's.  Dermabond was applied to the incision and exparel injected into the incisions.  The Foley catheter was then capped. The horseshoe [place] retractor was then applied to the drape and the Foley catheter was attached to it. Using the skin hooks for skin nodes were placed in the corners of the labia minora to open up the vaginal vestibule. 5 cc of quarter percent Marcaine with 1% epinephrine was then injected into the periurethral tissue. The suprapubic incisions were then marked out 1 cm lateral to midline and 1 cm above the pubic bone. Using a 15 blade a stab incision was made in both sides of midline. Gauze is placed over these areas to control the skin bleeding. A 1.5 cm incision was then made in the mid urethra through the vaginal mucosa. Using this Lee scissors dissection was then carried out. Her urethra we laterally on both sides  to the endopelvic fascia. The dissection was completed once there was enough space to place a finger through the incision on both sides of the urethra. Using the [facility] needle set both needles were passed through the stab incisions suprapubically down posterior to the pubic bone and then through the periurethral incisions using the index finger to guide the needle out of the incision. Once both needles had been placed the Foley catheter was removed and cystoscopy was performed. A 70 lens was passed gently through the patient's urethra and into the bladder under visual guidance. A 360 cystoscopic evaluation was performed. There was no mucosal abnormalities or evidence of perforation from the needles. The cystoscope was then removed and the Foley catheter  replaced. The bladder was then drained again and the Foley catheter. The ends of the sling were then attached to the needles and the needles pulled up through the retropubic space and out the suprapubic incision. Once the sling was noted to be centered, the blue plastic cap at the apex of the sling was cut and the plastic sheath was then pulled out. The mesh was noted to be well seated around the urethra. A right angle was used to ensure that the proper amount of tension for the sling around the urethra applied. The sling was noted to be tension free and well positioned. At this point copious amounts of double antibiotic irrigation was then used to irrigate the incision the periurethral space and the vagina. The incision was then closed with 2-0 Vicryl in a running fashion. The stab incisions in the suprapubic area were closed with Dermabond. The vagina was then packed with clindamycin impregnated vaginal packing. The patient was subsequently extubated and returned to the PACU in stable condition.  Estrace impregnated packing was then placed into her vagina which will be left in overnight.  The patient was subsequently extubated and returned to the PACU in excellent condition.

## 2022-10-09 NOTE — Anesthesia Procedure Notes (Signed)
Procedure Name: Intubation Date/Time: 10/09/2022 7:45 AM  Performed by: Santa Lighter, MDPre-anesthesia Checklist: Patient identified, Emergency Drugs available, Suction available, Patient being monitored and Timeout performed Patient Re-evaluated:Patient Re-evaluated prior to induction Oxygen Delivery Method: Circle system utilized Preoxygenation: Pre-oxygenation with 100% oxygen Induction Type: IV induction Ventilation: Mask ventilation without difficulty Laryngoscope Size: Mac and 3 Grade View: Grade II Tube type: Oral Tube size: 7.0 mm Number of attempts: 1 Airway Equipment and Method: Stylet Placement Confirmation: ETT inserted through vocal cords under direct vision, positive ETCO2, CO2 detector and breath sounds checked- equal and bilateral Secured at: 22 cm Tube secured with: Tape Dental Injury: Teeth and Oropharynx as per pre-operative assessment

## 2022-10-09 NOTE — Interval H&P Note (Signed)
History and Physical Interval Note:  10/09/2022 7:29 AM  Randa Evens  has presented today for surgery, with the diagnosis of PELVIC ORGAN PROLASPE/STRESS URINARY INCONTINENCE.  The various methods of treatment have been discussed with the patient and family. After consideration of risks, benefits and other options for treatment, the patient has consented to  Procedure(s) with comments: XI ROBOTIC ASSISTED LAPAROSCOPIC SUPRACERVICAL HYSTERECTOMY AND BILATERAL  SALPINGECTOMY (Bilateral) - 270 MINUTES NEEDED FOR CASE XI ROBOTIC ASSISTED LAPAROSCOPIC SACROCOLPOPEXY (N/A) MID-URETHRAL SLING (N/A) CYSTOSCOPY (N/A) as a surgical intervention.  The patient's history has been reviewed, patient examined, no change in status, stable for surgery.  I have reviewed the patient's chart and labs.  Questions were answered to the patient's satisfaction.     Ardis Hughs

## 2022-10-09 NOTE — H&P (Signed)
Pt presents today for pre-operative history and physical exam in anticipation of robotic assisted lap supracervical hysterectomy, bilateral salpingectomy, sacrocolpopexy, and mid urethral sling by Dr. Louis Meckel on 10/09/22. She is doing well and is without complaint.   Pt denies F/C, HA, CP, SOB, N/V, diarrhea/constipation, back pain, flank pain, hematuria, and dysuria.    HX:     CC: Pelvic organ prolapse  HPI: Courtney Costa is a 47 year-old female established patient who is here for further evaluation of her pelvic prolapse.  UDS: Courtney Costa held a max capacity of approx. 400 mls. Her 1st sensation was felt at 100 mls. There was positive SUI with and without prolapse reduction. She leaked with both coughing and Valsalva. Leakage was much worse when prolapse was not reduced. Please information above. No instability was noted. She was able to generate a voluntary contraction and void 333 mls with max flow of 2 ml/s. EMG leads were basically quiet during the voiding phase. PVR was approx. 33 mls. No trabeculation was noted. No reflux was seen.   The patient is here today for interval follow-up after undergoing urodynamics. She is here for further discussion of potential surgery for her prolapse and urinary incontinence.   She first noticed her pelvic prolapse approximately 06/08/2019. She does have pelvic pain. Her symptoms have been worse over the last year.   She does have an abnormal sensation when she needs to urinate. She is urinating more frequently now than usual. She does not have a good size and strength to her urinary stream. She is not having problems with emptying her bladder well.   She is having problems with urinary control or incontinence. She does wear protective pads. She can not get to the bathroom in time when she gets the urge to urinate. She does leak urine when she coughs, laughs, sneezes or bears down. The patient does not have a history of recurrent UTIs.   The patient does  posture when she voids or defecates. The patient has or has had trouble with constipation.   The patient has no history of pelvic floor surgery - she maintains her uterus.     ALLERGIES: Benzocaine - Swelling, Hives, Itching Vagisil CREA - Itching, Hives, Swelling    MEDICATIONS: Clobetasol Propionate     GU PSH: Complex cystometrogram, w/ void pressure and urethral pressure profile studies, any technique - 07/17/2022 Complex Uroflow - 07/17/2022 Emg surf Electrd - 07/17/2022 Inject For cystogram - 07/17/2022 Intrabd voidng Press - 07/17/2022       PSH Notes: Breast reduction (2015), laser eye (2012)   NON-GU PSH: None   GU PMH: Female genital prolapse, unspecified - 07/20/2022, - 07/17/2022, - 06/09/2022 Stress Incontinence - 07/20/2022, - 07/17/2022      PMH Notes:  1898-09-07 00:00:00 - Note: Normal Routine History And Physical Adult   NON-GU PMH: Anxiety Hypercholesterolemia    FAMILY HISTORY: 1 Daughter - Daughter 2 sons - Son nephrolithiasis - Father   SOCIAL HISTORY: Marital Status: Married Preferred Language: English; Ethnicity: Not Hispanic Or Latino Current Smoking Status: Patient has never smoked.   Tobacco Use Assessment Completed: Used Tobacco in last 30 days? Does not use smokeless tobacco. Has never drank.  Does not use drugs. Drinks 1 caffeinated drink per day. Has not had a blood transfusion. Patient's occupation Dentist.    REVIEW OF SYSTEMS:    GU Review Female:   Patient denies frequent urination, hard to postpone urination, burning /pain with urination, get up at night  to urinate, leakage of urine, stream starts and stops, trouble starting your stream, have to strain to urinate, and being pregnant.  Gastrointestinal (Upper):   Patient denies nausea, vomiting, and indigestion/ heartburn.  Gastrointestinal (Lower):   Patient reports constipation. Patient denies diarrhea.  Constitutional:   Patient denies fever, night sweats,  weight loss, and fatigue.  Skin:   Patient denies itching and skin rash/ lesion.  Eyes:   Patient denies blurred vision and double vision.  Ears/ Nose/ Throat:   Patient denies sore throat and sinus problems.  Hematologic/Lymphatic:   Patient denies swollen glands and easy bruising.  Cardiovascular:   Patient denies leg swelling and chest pains.  Respiratory:   Patient denies cough and shortness of breath.  Endocrine:   Patient denies excessive thirst.  Musculoskeletal:   Patient denies back pain and joint pain.  Neurological:   Patient denies headaches and dizziness.  Psychologic:   Patient denies depression and anxiety.   VITAL SIGNS:      09/22/2022 03:47 PM  Weight 215 lb / 97.52 kg  Height 61 in / 154.94 cm  BP 126/86 mmHg  Heart Rate 66 /min  BMI 40.6 kg/m   MULTI-SYSTEM PHYSICAL EXAMINATION:    Constitutional: Well-nourished. No physical deformities. Normally developed. Good grooming.  Neck: Neck symmetrical, not swollen. Normal tracheal position.  Respiratory: Normal breath sounds. No labored breathing, no use of accessory muscles.   Cardiovascular: Regular rate and rhythm. No murmur, no gallop.   Lymphatic: No enlargement of neck, axillae, groin.  Skin: No paleness, no jaundice, no cyanosis. No lesion, no ulcer, no rash.  Neurologic / Psychiatric: Oriented to time, oriented to place, oriented to person. No depression, no anxiety, no agitation.  Gastrointestinal: No mass, no tenderness, no rigidity, non obese abdomen.  Eyes: Normal conjunctivae. Normal eyelids.  Ears, Nose, Mouth, and Throat: Left ear no scars, no lesions, no masses. Right ear no scars, no lesions, no masses. Nose no scars, no lesions, no masses. Normal hearing. Normal lips.  Musculoskeletal: Normal gait and station of head and neck.     Complexity of Data:  Records Review:   Previous Patient Records  Urine Test Review:   Urinalysis   09/22/22  Urinalysis  Urine Appearance Clear   Urine Color Yellow    Urine Glucose Neg mg/dL  Urine Bilirubin Neg mg/dL  Urine Ketones Neg mg/dL  Urine Specific Gravity 1.025   Urine Blood Neg ery/uL  Urine pH 6.0   Urine Protein Neg mg/dL  Urine Urobilinogen 0.2 mg/dL  Urine Nitrites Neg   Urine Leukocyte Esterase Neg leu/uL   PROCEDURES:          Urinalysis - 81003 Dipstick Dipstick Cont'd  Color: Yellow Bilirubin: Neg  Appearance: Clear Ketones: Neg  Specific Gravity: 1.025 Blood: Neg  pH: 6.0 Protein: Neg  Glucose: Neg Urobilinogen: 0.2    Nitrites: Neg    Leukocyte Esterase: Neg    Notes:      ASSESSMENT:      ICD-10 Details  1 GU:   Female genital prolapse, unspecified - N81.9   2   Stress Incontinence - N39.3    PLAN:           Schedule Return Visit/Planned Activity: Keep Scheduled Appointment - Schedule Surgery          Document Letter(s):  Created for Patient: Clinical Summary         Notes:   There are no changes in the patients history or physical exam since  last evaluation by Dr. Louis Meckel. Pt is scheduled to undergo RAL supracervical hysterectomy, bilateral salpingectomy, sacrocolpopexy, and mid urethral sling on 10/09/22.   All pt's questions were answered to the best of my ability.

## 2022-10-09 NOTE — Transfer of Care (Signed)
Immediate Anesthesia Transfer of Care Note  Patient: Courtney Costa  Procedure(s) Performed: XI ROBOTIC ASSISTED LAPAROSCOPIC SUPRACERVICAL HYSTERECTOMY AND BILATERAL  SALPINGECTOMY (Bilateral) XI ROBOTIC ASSISTED LAPAROSCOPIC SACROCOLPOPEXY MID-URETHRAL SLING CYSTOSCOPY  Patient Location: PACU  Anesthesia Type:General  Level of Consciousness: drowsy  Airway & Oxygen Therapy: Patient Spontanous Breathing and Patient connected to face mask oxygen  Post-op Assessment: Report given to RN and Post -op Vital signs reviewed and stable  Post vital signs: Reviewed and stable  Last Vitals:  Vitals Value Taken Time  BP 128/82 10/09/22 1237  Temp    Pulse 71 10/09/22 1240  Resp 12 10/09/22 1240  SpO2 100 % 10/09/22 1240  Vitals shown include unvalidated device data.  Last Pain:  Vitals:   10/09/22 0559  TempSrc:   PainSc: 0-No pain         Complications: No notable events documented.

## 2022-10-09 NOTE — Anesthesia Postprocedure Evaluation (Signed)
Anesthesia Post Note  Patient: Courtney Costa  Procedure(s) Performed: XI ROBOTIC ASSISTED LAPAROSCOPIC SUPRACERVICAL HYSTERECTOMY AND BILATERAL  SALPINGECTOMY (Bilateral) XI ROBOTIC ASSISTED LAPAROSCOPIC SACROCOLPOPEXY MID-URETHRAL SLING CYSTOSCOPY     Patient location during evaluation: PACU Anesthesia Type: General Level of consciousness: awake and alert Pain management: pain level controlled Vital Signs Assessment: post-procedure vital signs reviewed and stable Respiratory status: spontaneous breathing, nonlabored ventilation, respiratory function stable and patient connected to nasal cannula oxygen Cardiovascular status: blood pressure returned to baseline and stable Postop Assessment: no apparent nausea or vomiting Anesthetic complications: no   No notable events documented.  Last Vitals:  Vitals:   10/09/22 1506 10/09/22 1516  BP: 135/81 135/81  Pulse: 62 62  Resp:  16  Temp: 37 C 37 C  SpO2: 100%     Last Pain:  Vitals:   10/09/22 1516  TempSrc: Oral  PainSc:                  Santa Lighter

## 2022-10-10 DIAGNOSIS — N819 Female genital prolapse, unspecified: Secondary | ICD-10-CM | POA: Diagnosis not present

## 2022-10-10 DIAGNOSIS — N736 Female pelvic peritoneal adhesions (postinfective): Secondary | ICD-10-CM | POA: Diagnosis not present

## 2022-10-10 DIAGNOSIS — Z6838 Body mass index (BMI) 38.0-38.9, adult: Secondary | ICD-10-CM | POA: Diagnosis not present

## 2022-10-10 DIAGNOSIS — N393 Stress incontinence (female) (male): Secondary | ICD-10-CM | POA: Diagnosis not present

## 2022-10-10 DIAGNOSIS — K5900 Constipation, unspecified: Secondary | ICD-10-CM | POA: Diagnosis not present

## 2022-10-10 DIAGNOSIS — N8110 Cystocele, unspecified: Secondary | ICD-10-CM | POA: Diagnosis not present

## 2022-10-10 DIAGNOSIS — E669 Obesity, unspecified: Secondary | ICD-10-CM | POA: Diagnosis not present

## 2022-10-10 LAB — BASIC METABOLIC PANEL
Anion gap: 8 (ref 5–15)
BUN: 9 mg/dL (ref 6–20)
CO2: 25 mmol/L (ref 22–32)
Calcium: 8.2 mg/dL — ABNORMAL LOW (ref 8.9–10.3)
Chloride: 101 mmol/L (ref 98–111)
Creatinine, Ser: 0.66 mg/dL (ref 0.44–1.00)
GFR, Estimated: >60 mL/min (ref >60)
Glucose, Bld: 99 mg/dL (ref 70–99)
Potassium: 3.5 mmol/L (ref 3.5–5.1)
Sodium: 134 mmol/L — ABNORMAL LOW (ref 135–145)

## 2022-10-10 LAB — HEMOGLOBIN AND HEMATOCRIT, BLOOD
HCT: 34.1 % — ABNORMAL LOW (ref 36.0–46.0)
Hemoglobin: 10.9 g/dL — ABNORMAL LOW (ref 12.0–15.0)

## 2022-10-10 NOTE — Progress Notes (Signed)
  Transition of Care [facility]) Screening Note   Patient Details  Name: Courtney Costa Date of Birth: [birth year]   Transition of Care [facility]) CM/SW Contact:    Henrietta Dine, RN Phone Number: 10/10/2022, 1:53 PM    Transition of Care Department [facility]) has reviewed patient and no TOC needs have been identified at this time. We will continue to monitor patient advancement through interdisciplinary progression rounds. If new patient transition needs arise, please place a TOC consult.

## 2022-10-10 NOTE — Progress Notes (Signed)
Removed vaginal packing and foley discontinued per MD order.

## 2022-10-10 NOTE — Progress Notes (Signed)
#   16 Fr ureteral cath inserted w return of 1000 cc's amber urine. Instructed and demonstrated w pt on care of foley cath, how to empty bag, and how to connect/reconnect from standard drainage bag at night, then change to leg bag during the day. Reviewed written d/c instructions w pt and her sister and all questions answered. She verbalized understanding. D/c via w/c w all belongings in stable condition.

## 2022-10-10 NOTE — Discharge Summary (Signed)
[facility] Discharge Summary  Admit date: 10/09/2022  Discharge date and time: 10/10/22   Discharge to: Home  Discharge Service: Urology  Discharge Attending Physician:  Dr. Louis Meckel  Discharge  Diagnoses: Cystocele with prolapse  Secondary Diagnosis: Principal Problem:   Cystocele with prolapse   OR Procedures: Procedure(s): XI ROBOTIC ASSISTED LAPAROSCOPIC SUPRACERVICAL HYSTERECTOMY AND BILATERAL  SALPINGECTOMY XI ROBOTIC ASSISTED LAPAROSCOPIC SACROCOLPOPEXY MID-URETHRAL SLING CYSTOSCOPY 10/09/2022   Ancillary Procedures: None   Discharge Day Services: The patient was seen and examined by the Urology team both in the morning and immediately prior to discharge.  Vital signs and laboratory values were stable and within normal limits.  The physical exam was benign and unchanged and all surgical wounds were examined.  Discharge instructions were explained and all questions answered.  Subjective  No acute events overnight. Pain Controlled. No fever or chills.  Objective Patient Vitals for the past 8 hrs:  BP Temp Temp src Pulse Resp SpO2  10/10/22 1326 114/77 100 F (37.8 C) Axillary 69 18 99 %  10/10/22 0959 (!) 108/58 98 F (36.7 C) Oral 61 18 99 %   No intake/output data recorded.  General Appearance:        No acute distress Lungs:                       Normal work of breathing on room air Heart:                                Regular rate and rhythm Abdomen:                         Soft, non-tender, non-distended Extremities:                      Warm and well perfused   Hospital Course:  The patient underwent robotic sacrocolpopexy with mid urethral sling on 10/09/2022.  The patient tolerated the procedure well, was extubated in the OR, and afterwards was taken to the PACU for routine post-surgical care. When stable the patient was transferred to the floor.   The patient did well postoperatively.  The patient's diet was slowly advanced and at the time of discharge  was tolerating a regular diet.  The patient was discharged home 1 Day Post-Op, at which point was tolerating a regular solid diet, have adequate pain control with P.O. pain medication, and could ambulate without difficulty. Unfortunately she was unable to pass a trial of void and so catheter was replaced for bladder rest. She will follow-up with [place] in a few days for repeat voiding trial and post operative check  Condition at Discharge: Improved  Discharge Medications:  Allergies as of 10/10/2022       Reactions   Vagisil [anti-itch Vaginal] Swelling   Benzocaine Swelling   Angioedema - can use lidocain        Medication List     STOP taking these medications    ibuprofen 200 MG tablet Commonly known as: ADVIL   nitrofurantoin (macrocrystal-monohydrate) 100 MG capsule Commonly known as: MACROBID       TAKE these medications    clobetasol cream 0.05 % Commonly known as: TEMOVATE Apply 1 Application topically daily as needed (irritation).   docusate sodium 100 MG capsule Commonly known as: COLACE Take 1 capsule (100 mg total) by mouth 2 (two) times daily.  hydrOXYzine 25 MG tablet Commonly known as: ATARAX Take 1 tablet (25 mg total) by mouth every 6 (six) hours.   predniSONE 50 MG tablet Commonly known as: DELTASONE Take 1 tablet (50 mg total) by mouth daily with breakfast.   sulfamethoxazole-trimethoprim 800-160 MG tablet Commonly known as: BACTRIM DS Take 1 tablet by mouth 2 (two) times daily.   traMADol 50 MG tablet Commonly known as: Ultram Take 1-2 tablets (50-100 mg total) by mouth every 6 (six) hours as needed for moderate pain or severe pain.

## 2022-10-10 NOTE — Progress Notes (Signed)
Mobility Specialist - Progress Note   10/10/22 1130  Mobility  Activity Ambulated with assistance in hallway  Level of Assistance Modified independent, requires aide device or extra time  Assistive Device Front wheel walker  Distance Ambulated (ft) 200 ft  Activity Response Tolerated well  Mobility Referral Yes  $Mobility charge 1 Mobility   Pt received in recliner and agreeable to mobility. Prior to ambulating pt stated she was in a lot of pain but still eager to ambulate. C/o pain near incisions & bladder feeling "full". No other complaints during session. Pt to recliner after session with all needs met.  [facility]

## 2022-10-11 ENCOUNTER — Encounter (HOSPITAL_COMMUNITY): Payer: Self-pay | Admitting: Urology

## 2022-10-12 LAB — SURGICAL PATHOLOGY

## 2022-10-13 DIAGNOSIS — N393 Stress incontinence (female) (male): Secondary | ICD-10-CM | POA: Diagnosis not present

## 2022-10-23 DIAGNOSIS — N819 Female genital prolapse, unspecified: Secondary | ICD-10-CM | POA: Diagnosis not present

## 2022-10-23 DIAGNOSIS — N393 Stress incontinence (female) (male): Secondary | ICD-10-CM | POA: Diagnosis not present

## 2022-10-23 DIAGNOSIS — R8271 Bacteriuria: Secondary | ICD-10-CM | POA: Diagnosis not present

## 2022-11-20 DIAGNOSIS — N819 Female genital prolapse, unspecified: Secondary | ICD-10-CM | POA: Diagnosis not present

## 2022-11-23 ENCOUNTER — Ambulatory Visit: Payer: BC Managed Care – PPO | Admitting: Sports Medicine

## 2022-11-23 ENCOUNTER — Ambulatory Visit (INDEPENDENT_AMBULATORY_CARE_PROVIDER_SITE_OTHER): Payer: BC Managed Care – PPO

## 2022-11-23 DIAGNOSIS — M545 Low back pain, unspecified: Secondary | ICD-10-CM

## 2022-11-23 DIAGNOSIS — M5136 Other intervertebral disc degeneration, lumbar region: Secondary | ICD-10-CM | POA: Diagnosis not present

## 2022-11-23 DIAGNOSIS — M25512 Pain in left shoulder: Secondary | ICD-10-CM | POA: Diagnosis not present

## 2022-11-23 DIAGNOSIS — G8929 Other chronic pain: Secondary | ICD-10-CM | POA: Diagnosis not present

## 2022-11-23 MED ORDER — CYCLOBENZAPRINE HCL 10 MG PO TABS: 5.0000 mg | ORAL_TABLET | Freq: Every day | ORAL | 3 refills | Status: DC

## 2022-11-23 MED ORDER — MELOXICAM 15 MG PO TABS: ORAL_TABLET | ORAL | 3 refills | Status: DC

## 2022-11-23 MED ORDER — PREDNISONE 50 MG PO TABS: ORAL_TABLET | ORAL | 0 refills | Status: DC

## 2022-11-23 NOTE — Assessment & Plan Note (Signed)
Years of left shoulder pain, labral signs on exam, adding x-rays, formal physical therapy, MR arthrography if not better in 6 weeks.

## 2022-11-23 NOTE — Assessment & Plan Note (Signed)
Also having chronic axial low back pain, bilateral with radiation down into the buttock and thighs, occasional sciatic symptoms but when sitting on the toilet, pain is worse with Valsalva. She also has significant nocturnal symptoms, adding 5 days of steroids, Flexeril at night, meloxicam during the day after prednisone finishes. Formal physical therapy, x-rays, return to see me in 6 weeks, MR for interventional planning if not better.

## 2022-11-23 NOTE — Progress Notes (Signed)
    Procedures performed today:    None.  Independent interpretation of notes and tests performed by another provider:   None.  Brief History, Exam, Impression, and Recommendations:    Chronic left shoulder pain Years of left shoulder pain, labral signs on exam, adding x-rays, formal physical therapy, MR arthrography if not better in 6 weeks.  Chronic bilateral low back pain Also having chronic axial low back pain, bilateral with radiation down into the buttock and thighs, occasional sciatic symptoms but when sitting on the toilet, pain is worse with Valsalva. She also has significant nocturnal symptoms, adding 5 days of steroids, Flexeril at night, meloxicam during the day after prednisone finishes. Formal physical therapy, x-rays, return to see me in 6 weeks, MR for interventional planning if not better.    ____________________________________________ Gwen Her. Dianah Field, M.D., ABFM., CAQSM., AME. Primary Care and Sports Medicine [facility]  Adjunct Professor of [place] [employer]  Risk manager

## 2022-11-24 ENCOUNTER — Other Ambulatory Visit: Payer: Self-pay

## 2022-11-24 ENCOUNTER — Ambulatory Visit: Payer: BC Managed Care – PPO | Attending: Sports Medicine | Admitting: Physical Therapy

## 2022-11-24 ENCOUNTER — Encounter: Payer: Self-pay | Admitting: Sports Medicine

## 2022-11-24 ENCOUNTER — Encounter: Payer: Self-pay | Admitting: Physical Therapy

## 2022-11-24 DIAGNOSIS — M5459 Other low back pain: Secondary | ICD-10-CM | POA: Diagnosis not present

## 2022-11-24 DIAGNOSIS — M6281 Muscle weakness (generalized): Secondary | ICD-10-CM | POA: Insufficient documentation

## 2022-11-24 DIAGNOSIS — G8929 Other chronic pain: Secondary | ICD-10-CM | POA: Diagnosis not present

## 2022-11-24 DIAGNOSIS — M25512 Pain in left shoulder: Secondary | ICD-10-CM | POA: Insufficient documentation

## 2022-11-24 NOTE — Therapy (Signed)
OUTPATIENT PHYSICAL THERAPY EVALUATION   Patient Name: Courtney Costa MRN: [id number] DOB:1975/10/05, 47 y.o., female Today's Date: 11/24/2022  END OF SESSION:  PT End of Session - 11/24/22 1031     Visit Number 1    Number of Visits 12    Date for PT Re-Evaluation 01/05/23    Authorization Type [employer]    PT Start Time 0930    PT Stop Time 1015    PT Time Calculation (min) 45 min    Activity Tolerance Patient tolerated treatment well    Behavior During Therapy [facility] for tasks assessed/performed             Past Medical History:  Diagnosis Date   Medical history non-contributory    Spondylisthesis 05/16/2012   Spondylisthesis 05/16/2012   Past Surgical History:  Procedure Laterality Date   BREAST REDUCTION SURGERY  08/2014   COLONOSCOPY     CYSTOSCOPY N/A 10/09/2022   Procedure: CYSTOSCOPY;  Surgeon: Ardis Hughs, MD;  Location: [facility];  Service: Urology;  Laterality: N/A;   EYE SURGERY Bilateral    Lasik eye surgery   PUBOVAGINAL SLING N/A 10/09/2022   Procedure: MID-URETHRAL SLING;  Surgeon: Ardis Hughs, MD;  Location: [facility];  Service: Urology;  Laterality: N/A;   REDUCTION MAMMAPLASTY Bilateral    ROBOTIC ASSISTED LAPAROSCOPIC HYSTERECTOMY AND SALPINGECTOMY Bilateral 10/09/2022   Procedure: XI ROBOTIC ASSISTED LAPAROSCOPIC SUPRACERVICAL HYSTERECTOMY AND BILATERAL  SALPINGECTOMY;  Surgeon: Ardis Hughs, MD;  Location: [facility];  Service: Urology;  Laterality: Bilateral;  270 MINUTES NEEDED FOR CASE   ROBOTIC ASSISTED LAPAROSCOPIC SACROCOLPOPEXY N/A 10/09/2022   Procedure: XI ROBOTIC ASSISTED LAPAROSCOPIC SACROCOLPOPEXY;  Surgeon: Ardis Hughs, MD;  Location: [facility];  Service: Urology;  Laterality: N/A;   Patient Active Problem List   Diagnosis Date Noted   Chronic left shoulder pain 11/23/2022   Chronic bilateral low back pain 11/23/2022   Cystocele with prolapse 10/09/2022   Oligomenorrhea 02/05/2022   Abnormal weight gain 10/23/2021    Well adult exam 11/22/2019   Bilateral wrist pain 11/14/2019   Menorrhagia 09/13/2019   SUI (stress urinary incontinence, female) 09/13/2019   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 The pain has started to increase and she saw MD who recommended PT. She also has been having low back pain which has been going on for about 6 months. She had a hysterectomy with bladder lift about 6 weeks ago and thought the increase in back pain was due to that but she states the pain has continued to worsen. Pain in back is worse with laying down, turning in bed and with prolonged sitting, pain eases with walking, meds  PERTINENT HISTORY: Hysterectomy 2/24, breast reduction 2015  PAIN:  Are you having pain? Yes: NPRS scale: 2/10 currently, 10/10 with turning in bed/10 Pain location: low back Pain description: sharp, tingling Aggravating factors: laying down, turning in bed, prolonged sitting Relieving factors: walking  PRECAUTIONS: None  WEIGHT BEARING RESTRICTIONS: No  FALLS:  Has patient fallen in last 6 months? No   OCCUPATION: Payroll, desk job  PLOF: Independent  PATIENT GOALS:decrease pain  NEXT MD VISIT:  4/29  OBJECTIVE:   DIAGNOSTIC FINDINGS:  Results pending at time of eval  PATIENT SURVEYS:  FOTO 60   POSTURE: Increased lumbar lordosis, decreased hamstring strength bilat  LUMBAR ROM:   Active  A/PROM  eval  Flexion Limited 25%  Extension WFL pain  Right lateral flexion Limited 25%  Left lateral flexion Limited 25%  Right rotation WFL  Left rotation WFL   (Blank rows = not tested)   UPPER EXTREMITY MMT:  MMT Right eval Left eval  Shoulder flexion 4+ 3 pain  Shoulder extension    Shoulder abduction 4+ 3 pain  Shoulder adduction    Shoulder internal rotation    Shoulder external rotation    Middle trapezius    Lower trapezius    Elbow flexion    Elbow extension    Wrist flexion    Wrist extension    Wrist ulnar deviation    Wrist radial deviation    Wrist pronation    Wrist supination    Grip strength (lbs)    (Blank rows = not tested) LOWER EXTREMITY MMT:    MMT Right eval Left eval  Hip flexion 4 pain 4 pain  Hip extension 3+ 3+  Hip abduction  4  Hip adduction    Hip internal rotation    Hip external rotation  4  Knee flexion    Knee extension    Ankle dorsiflexion    Ankle plantarflexion    Ankle inversion    Ankle eversion     (Blank rows = not tested)   Shoulder ROM WFL  PALPATION:  TTP SIJ CPAs and UPAs No TTP lumbar spine or piriformis   TODAY'S TREATMENT:  [place] Adult PT Treatment:                                                DATE: 11/24/22 Therapeutic Exercise: See HEP  Modalities: TENS x 10 min lumbar/SIJ to tolerance Moist heat lumbar/SIJ     PATIENT EDUCATION: Education details: PT POC and goals, HEP, TENS Person educated: Patient Education method: Explanation, Demonstration, and Handouts Education comprehension: verbalized understanding and returned demonstration  HOME EXERCISE  PROGRAM: Access Code: [email or web address] URL: [email or web address] Date: 11/24/2022 Prepared by: Courtney Costa  Exercises - Supine Hip Adduction Isometric with Ball  - 1 x daily - 7 x weekly - 2 sets - 10 reps - 3 sconds hold - Hooklying Clamshell with Resistance  - 1 x daily - 7 x weekly - 2 sets - 10 reps - Abdominal Press into [place]  - 1 x daily - 7 x weekly - 2 sets - 10 reps - 3 seconds hold - Supine 90/90 Alternating Toe Touch  - 1 x daily - 7 x weekly - 2 sets - 10 reps - 3 seconds hold  ASSESSMENT:  CLINICAL IMPRESSION: Patient is a 47 y.o. female who was seen today for physical therapy evaluation and treatment for low back pain and left shoulder pain. Pt presents with decreased core strength, generalized weakness, decreased activity tolerance, increased pain and decreased functional mobility. She will benefit from skilled PT to address deficits and improve functional mobility with decreased pain.  OBJECTIVE IMPAIRMENTS: decreased activity tolerance, decreased mobility, decreased ROM, decreased strength, impaired UE functional use, and pain.   ACTIVITY LIMITATIONS: carrying, sitting, sleeping, and transfers  PARTICIPATION LIMITATIONS: community activity and occupation  PERSONAL FACTORS: Time since onset of injury/illness/exacerbation and 1 comorbidity: recent abdominal surgery  are also affecting patient's functional outcome.   REHAB POTENTIAL: Good  CLINICAL DECISION MAKING: Evolving/moderate complexity  EVALUATION COMPLEXITY: Moderate   GOALS: Goals reviewed with patient? Yes  SHORT TERM GOALS: Target date: 12/08/2022    Pt will be independent with initial HEP Baseline: Goal status: INITIAL   LONG TERM GOALS: Target date: 01/05/2023    Pt will be independent with advanced HEP Baseline:  Goal status: INITIAL  2.  Pt will improve FOTO to >= 72 to demo improved functional mobility Baseline:  Goal status: INITIAL  3.  Pt will improve Lt shoulder  strength to 4+/5 to improve tolerance to lifting and carrying items Baseline:  Goal status: INITIAL  4.  Pt will demo improved abdominal strength by being able to roll in bed with pain <= 2/10 Baseline:  Goal status: INITIAL   PLAN:  PT FREQUENCY: 2x/week  PT DURATION: 6 weeks  PLANNED INTERVENTIONS: Therapeutic exercises, Therapeutic activity, Neuromuscular re-education, Balance training, Gait training, Patient/Family education, Self Care, Joint mobilization, Aquatic Therapy, Dry Needling, Electrical stimulation, Cryotherapy, Moist heat, Taping, Ultrasound, Ionotophoresis 4mg /ml Dexamethasone, Manual therapy, and Re-evaluation  PLAN FOR NEXT SESSION: Add rows and shoulder extension for core/shoulder strength, assess HEP, core strength   Darcella Shiffman, PT 11/24/2022, 10:34 AM

## 2022-11-26 ENCOUNTER — Other Ambulatory Visit: Payer: Self-pay | Admitting: Sports Medicine

## 2022-11-26 ENCOUNTER — Ambulatory Visit: Payer: BC Managed Care – PPO | Admitting: Family Medicine

## 2022-11-26 ENCOUNTER — Encounter: Payer: Self-pay | Admitting: Sports Medicine

## 2022-11-26 NOTE — Telephone Encounter (Signed)
Spoke with Dr. Mel Almond. Placed on schedule for 9:30am tomorrow arriving at 9:20. Patient instructed to stop all medications until seen.

## 2022-11-27 ENCOUNTER — Encounter: Payer: Self-pay | Admitting: Physical Therapy

## 2022-11-27 ENCOUNTER — Encounter: Payer: Self-pay | Admitting: Family Medicine

## 2022-11-27 ENCOUNTER — Ambulatory Visit (INDEPENDENT_AMBULATORY_CARE_PROVIDER_SITE_OTHER): Payer: BC Managed Care – PPO | Admitting: Family Medicine

## 2022-11-27 ENCOUNTER — Ambulatory Visit: Payer: BC Managed Care – PPO | Admitting: Physical Therapy

## 2022-11-27 VITALS — BP 140/87 | HR 79 | Ht 62.0 in | Wt 212.0 lb

## 2022-11-27 DIAGNOSIS — G8929 Other chronic pain: Secondary | ICD-10-CM | POA: Diagnosis not present

## 2022-11-27 DIAGNOSIS — M5459 Other low back pain: Secondary | ICD-10-CM | POA: Diagnosis not present

## 2022-11-27 DIAGNOSIS — M6281 Muscle weakness (generalized): Secondary | ICD-10-CM | POA: Diagnosis not present

## 2022-11-27 DIAGNOSIS — M25512 Pain in left shoulder: Secondary | ICD-10-CM | POA: Diagnosis not present

## 2022-11-27 DIAGNOSIS — K1379 Other lesions of oral mucosa: Secondary | ICD-10-CM | POA: Insufficient documentation

## 2022-11-27 MED ORDER — MAGIC MOUTHWASH W/LIDOCAINE: 10.0000 mL | Freq: Four times a day (QID) | ORAL | 1 refills | Status: DC | PRN

## 2022-11-27 NOTE — Assessment & Plan Note (Signed)
Patient presents with mouth sores after using prednisone, meloxicam, and Flexeril.  It is unclear which 1 of these cause the sores which may be an allergy for her. - Have sent in Magic mouthwash with lidocaine all of which was verified with the patient that she is not allergic to any of these 3 components.  Also recommended Motrin as well as Tylenol for pain control.  Recommended popsicles for pain relief as well.

## 2022-11-27 NOTE — Progress Notes (Signed)
Acute Office Visit  Subjective:     Patient ID: Courtney Costa, female    DOB: 1976/01/23, 47 y.o.   MRN: [id number]  Chief Complaint  Patient presents with   Mouth Lesions    HPI Patient is in today for mouth sores.  She was recently given a prescription of prednisone, meloxicam, and Flexeril.  She took all 3 of these and the next morning noticed she had sores in her mouth.  Sores are located on her tongue and are very painful in nature.  Today this happened before.  Review of Systems  Constitutional:  Negative for chills and fever.  HENT:  Positive for sore throat.        Mouth sores  Respiratory:  Negative for cough and shortness of breath.   Cardiovascular:  Negative for chest pain.  Neurological:  Negative for headaches.        Objective:    BP (!) 140/87   Pulse 79   Ht 5\' 2"  (1.575 m)   Wt 212 lb (96.2 kg)   SpO2 100%   BMI 38.78 kg/m    Physical Exam Vitals and nursing note reviewed.  Constitutional:      General: She is not in acute distress.    Appearance: Normal appearance.  HENT:     Head: Normocephalic and atraumatic.     Right Ear: External ear normal.     Left Ear: External ear normal.     Nose: Nose normal.     Mouth/Throat:     Comments: Erythematous sores present under the base of the tongue as well as inside cheek.  Photo Eyes:     Conjunctiva/sclera: Conjunctivae normal.  Cardiovascular:     Rate and Rhythm: Normal rate and regular rhythm.  Pulmonary:     Effort: Pulmonary effort is normal.     Breath sounds: Normal breath sounds.  Neurological:     General: No focal deficit present.     Mental Status: She is alert and oriented to person, place, and time.  Psychiatric:        Mood and Affect: Mood normal.        Behavior: Behavior normal.        Thought Content: Thought content normal.        Judgment: Judgment normal.     No results found for any visits on 11/27/22.      Assessment & Plan:   Problem List Items  Addressed This Visit       Digestive   Mouth sores - Primary    Patient presents with mouth sores after using prednisone, meloxicam, and Flexeril.  It is unclear which 1 of these cause the sores which may be an allergy for her. - Have sent in Magic mouthwash with lidocaine all of which was verified with the patient that she is not allergic to any of these 3 components.  Also recommended Motrin as well as Tylenol for pain control.  Recommended popsicles for pain relief as well.      Relevant Medications   magic mouthwash w/lidocaine SOLN    Meds ordered this encounter  Medications   magic mouthwash w/lidocaine SOLN    Sig: Take 10 mLs by mouth 4 (four) times daily as needed for mouth pain. Take 10 mLs by mouth 4 times daily as needed for mouth pain. Swish for 2 minutes then spit out.    Dispense:  500 mL    Refill:  1    Return if  symptoms worsen or fail to improve.  Owens Loffler, DO

## 2022-11-27 NOTE — Therapy (Signed)
OUTPATIENT PHYSICAL THERAPY    Patient Name: Courtney Costa MRN: [id number] DOB:08-Apr-1976, 47 y.o., female Today's Date: 11/27/2022  END OF SESSION:  PT End of Session - 11/27/22 0838     Visit Number 2    Number of Visits 12    Date for PT Re-Evaluation 01/05/23    PT Start Time 0800    PT Stop Time 0845    PT Time Calculation (min) 45 min    Activity Tolerance Patient tolerated treatment well    Behavior During Therapy [facility] for tasks assessed/performed              Past Medical History:  Diagnosis Date   Medical history non-contributory    Spondylisthesis 05/16/2012   Spondylisthesis 05/16/2012   Past Surgical History:  Procedure Laterality Date   BREAST REDUCTION SURGERY  08/2014   COLONOSCOPY     CYSTOSCOPY N/A 10/09/2022   Procedure: CYSTOSCOPY;  Surgeon: Ardis Hughs, MD;  Location: [facility];  Service: Urology;  Laterality: N/A;   EYE SURGERY Bilateral    Lasik eye surgery   PUBOVAGINAL SLING N/A 10/09/2022   Procedure: MID-URETHRAL SLING;  Surgeon: Ardis Hughs, MD;  Location: [facility];  Service: Urology;  Laterality: N/A;   REDUCTION MAMMAPLASTY Bilateral    ROBOTIC ASSISTED LAPAROSCOPIC HYSTERECTOMY AND SALPINGECTOMY Bilateral 10/09/2022   Procedure: XI ROBOTIC ASSISTED LAPAROSCOPIC SUPRACERVICAL HYSTERECTOMY AND BILATERAL  SALPINGECTOMY;  Surgeon: Ardis Hughs, MD;  Location: [facility];  Service: Urology;  Laterality: Bilateral;  270 MINUTES NEEDED FOR CASE   ROBOTIC ASSISTED LAPAROSCOPIC SACROCOLPOPEXY N/A 10/09/2022   Procedure: XI ROBOTIC ASSISTED LAPAROSCOPIC SACROCOLPOPEXY;  Surgeon: Ardis Hughs, MD;  Location: [facility];  Service: Urology;  Laterality: N/A;   Patient Active Problem List   Diagnosis Date Noted   Chronic left shoulder pain 11/23/2022   Chronic bilateral low back pain 11/23/2022   Cystocele with prolapse 10/09/2022   Oligomenorrhea 02/05/2022   Abnormal weight gain 10/23/2021   Well adult exam 11/22/2019    Bilateral wrist pain 11/14/2019   Menorrhagia 09/13/2019   SUI (stress urinary incontinence, female) 09/13/2019   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 She states she still has pain when she first wakes up in the morning  PERTINENT HISTORY: Hysterectomy 2/24, breast reduction 2015  Pt states she has had Lt shoulder pain off and on for about 10 years. The pain has started to increase and she saw MD who recommended PT. She also has been having low back pain which has been going on for about 6 months. She had a hysterectomy with bladder lift about 6 weeks ago and thought the increase in back pain was due to that but she states the pain has continued to worsen. Pain in back is worse with laying down, turning in bed and with prolonged sitting, pain eases with walking, meds PAIN:  Are you having pain? Yes: NPRS scale: 2/10 currently, 10/10 with turning in bed/10 Pain location: low back Pain description: sharp, tingling Aggravating factors: laying down, turning in bed, prolonged sitting Relieving factors: walking  PRECAUTIONS: None  WEIGHT BEARING RESTRICTIONS: No  FALLS:  Has patient fallen in last 6 months?  No   OCCUPATION: Payroll, desk job  PLOF: Independent  PATIENT GOALS:decrease pain  NEXT MD VISIT: 4/29  OBJECTIVE:   PATIENT SURVEYS:  FOTO 60   LUMBAR ROM:   Active  A/PROM  eval  Flexion Limited 25%  Extension WFL pain  Right lateral flexion Limited 25%  Left lateral flexion Limited 25%  Right rotation WFL  Left rotation WFL   (Blank rows = not tested)   UPPER EXTREMITY MMT:  MMT Right eval Left eval  Shoulder flexion 4+ 3 pain  Shoulder extension    Shoulder abduction 4+ 3 pain  Shoulder adduction    Shoulder internal rotation    Shoulder external rotation    Middle trapezius    Lower trapezius    Elbow flexion    Elbow extension    Wrist flexion    Wrist extension    Wrist ulnar deviation    Wrist radial deviation    Wrist pronation    Wrist supination    Grip strength (lbs)    (Blank rows = not tested) LOWER EXTREMITY MMT:    MMT Right eval Left eval  Hip flexion 4 pain 4 pain  Hip extension 3+ 3+  Hip abduction  4  Hip adduction    Hip internal rotation    Hip external rotation  4  Knee flexion    Knee extension    Ankle dorsiflexion    Ankle plantarflexion    Ankle inversion    Ankle eversion     (Blank rows = not tested)   Shoulder ROM WFL  PALPATION:  TTP SIJ CPAs and UPAs No TTP lumbar spine or piriformis   TODAY'S TREATMENT:  [facility] Adult PT Treatment:                                                DATE: 11/27/22 Therapeutic Exercise: Treadmill 1.70mph x 5 min for warm up Rows 15# 2 x 10 Shoulder extension 10# 2 x 10 [place] 5# x 10 bilat Ab isometric supine with ball 2 x 10, 3 sec hold Oblique isometric with ball 2 x 10 bilat,3 sec hold Adductor squeeze with ball 2 x 10, 3 sec hold Supine clam green TB 2 x 10 Manual Therapy: STM glutes  Modalities: TENS lumbar/SIJ x 10 min to  tolerance Moist heat lumbar/SIJ x 10 min   [facility] Adult PT Treatment:                                                DATE: 11/24/22 Therapeutic Exercise: See HEP  Modalities: TENS x 10 min lumbar/SIJ to tolerance Moist heat lumbar/SIJ     PATIENT EDUCATION: Education details: PT POC and goals, HEP, TENS Person educated: Patient Education method: Explanation, Demonstration, and Handouts Education comprehension: verbalized understanding and returned demonstration  HOME EXERCISE PROGRAM: Access Code: [email or web address] URL: [email or web address] Date: 11/24/2022 Prepared by: Isabelle Course  Exercises - Supine Hip Adduction Isometric with Ball  - 1 x daily - 7 x weekly - 2 sets - 10 reps - 3 sconds hold - Hooklying Clamshell with Resistance  - 1 x daily - 7 x weekly - 2 sets - 10 reps - Abdominal Press into [place]  - 1 x daily - 7 x weekly - 2 sets - 10 reps - 3 seconds hold - Supine 90/90 Alternating Toe Touch  - 1 x daily - 7 x weekly - 2 sets - 10 reps - 3 seconds hold  ASSESSMENT:  CLINICAL IMPRESSION: Pt with good tolerance to progression of exercises. Reports her back feels better after treatment  OBJECTIVE IMPAIRMENTS: decreased activity tolerance, decreased mobility, decreased ROM, decreased strength, impaired UE functional use, and pain.    GOALS: Goals reviewed with patient? Yes  SHORT TERM GOALS: Target date: 12/08/2022    Pt will be independent with initial HEP Baseline: Goal status: INITIAL   LONG TERM GOALS: Target date: 01/05/2023    Pt will be independent with advanced HEP Baseline:  Goal status: INITIAL  2.  Pt will improve FOTO to >= 72 to demo improved functional mobility Baseline:  Goal status: INITIAL  3.  Pt will improve Lt shoulder strength to 4+/5 to improve tolerance to lifting and carrying items Baseline:  Goal status: INITIAL  4.  Pt will demo improved abdominal strength by being able to roll in bed with pain <= 2/10 Baseline:   Goal status: INITIAL   PLAN:  PT FREQUENCY: 2x/week  PT DURATION: 6 weeks  PLANNED INTERVENTIONS: Therapeutic exercises, Therapeutic activity, Neuromuscular re-education, Balance training, Gait training, Patient/Family education, Self Care, Joint mobilization, Aquatic Therapy, Dry Needling, Electrical stimulation, Cryotherapy, Moist heat, Taping, Ultrasound, Ionotophoresis 4mg /ml Dexamethasone, Manual therapy, and Re-evaluation  PLAN FOR NEXT SESSION: Add quadruped activities for core/shoulder strength, assess HEP, core strength   Denesha Brouse, PT 11/27/2022, 8:38 AM

## 2022-11-30 NOTE — Telephone Encounter (Signed)
forwarding

## 2022-11-30 NOTE — Telephone Encounter (Signed)
Forwarding to Dr. Mel Almond as she was the last to see her for this.    CM

## 2022-12-01 ENCOUNTER — Other Ambulatory Visit: Payer: Self-pay | Admitting: Family Medicine

## 2022-12-01 ENCOUNTER — Ambulatory Visit: Payer: BC Managed Care – PPO | Admitting: Physical Therapy

## 2022-12-01 ENCOUNTER — Encounter: Payer: Self-pay | Admitting: Physical Therapy

## 2022-12-01 DIAGNOSIS — G8929 Other chronic pain: Secondary | ICD-10-CM | POA: Diagnosis not present

## 2022-12-01 DIAGNOSIS — M5459 Other low back pain: Secondary | ICD-10-CM

## 2022-12-01 DIAGNOSIS — K1379 Other lesions of oral mucosa: Secondary | ICD-10-CM

## 2022-12-01 DIAGNOSIS — M6281 Muscle weakness (generalized): Secondary | ICD-10-CM

## 2022-12-01 DIAGNOSIS — M25512 Pain in left shoulder: Secondary | ICD-10-CM | POA: Diagnosis not present

## 2022-12-01 MED ORDER — MAGIC MOUTHWASH W/LIDOCAINE: ORAL | 1 refills | Status: DC

## 2022-12-01 NOTE — Progress Notes (Signed)
Called and spoke to patient. She is feeling a little better. Encouraged continued use of tylenol and motrin as well as mouthwash. Pt needs refill on mouthwash. Have sent and will have staff fax

## 2022-12-01 NOTE — Therapy (Signed)
OUTPATIENT PHYSICAL THERAPY    Patient Name: Courtney Costa MRN: [id number] DOB:1976/04/18, 47 y.o., female Today's Date: 12/01/2022  END OF SESSION:  PT End of Session - 12/01/22 1136     Visit Number 3    Number of Visits 12    Date for PT Re-Evaluation 01/05/23    PT Start Time 1100    PT Stop Time [id number]    PT Time Calculation (min) 45 min    Activity Tolerance Patient tolerated treatment well    Behavior During Therapy [facility] for tasks assessed/performed               Past Medical History:  Diagnosis Date   Medical history non-contributory    Spondylisthesis 05/16/2012   Spondylisthesis 05/16/2012   Past Surgical History:  Procedure Laterality Date   BREAST REDUCTION SURGERY  08/2014   COLONOSCOPY     CYSTOSCOPY N/A 10/09/2022   Procedure: CYSTOSCOPY;  Surgeon: Ardis Hughs, MD;  Location: [facility];  Service: Urology;  Laterality: N/A;   EYE SURGERY Bilateral    Lasik eye surgery   PUBOVAGINAL SLING N/A 10/09/2022   Procedure: MID-URETHRAL SLING;  Surgeon: Ardis Hughs, MD;  Location: [facility];  Service: Urology;  Laterality: N/A;   REDUCTION MAMMAPLASTY Bilateral    ROBOTIC ASSISTED LAPAROSCOPIC HYSTERECTOMY AND SALPINGECTOMY Bilateral 10/09/2022   Procedure: XI ROBOTIC ASSISTED LAPAROSCOPIC SUPRACERVICAL HYSTERECTOMY AND BILATERAL  SALPINGECTOMY;  Surgeon: Ardis Hughs, MD;  Location: [facility];  Service: Urology;  Laterality: Bilateral;  270 MINUTES NEEDED FOR CASE   ROBOTIC ASSISTED LAPAROSCOPIC SACROCOLPOPEXY N/A 10/09/2022   Procedure: XI ROBOTIC ASSISTED LAPAROSCOPIC SACROCOLPOPEXY;  Surgeon: Ardis Hughs, MD;  Location: [facility];  Service: Urology;  Laterality: N/A;   Patient Active Problem List   Diagnosis Date Noted   Mouth sores 11/27/2022   Chronic left shoulder pain 11/23/2022   Chronic bilateral low back pain 11/23/2022   Cystocele with prolapse 10/09/2022   Oligomenorrhea 02/05/2022   Abnormal weight gain 10/23/2021   Well  adult exam 11/22/2019   Bilateral wrist pain 11/14/2019   Menorrhagia 09/13/2019   SUI (stress urinary incontinence, female) 09/13/2019   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 Still stiff in the morning but overall decreased pain  PERTINENT HISTORY: Hysterectomy 2/24, breast reduction 2015  Pt states she has had Lt shoulder pain off and on for about 10 years. The pain has started to increase and she saw MD who recommended PT. She also has been having low back pain which has been going on for about 6 months. She had a hysterectomy with bladder lift about 6 weeks ago and thought the increase in back pain was due to that but she states the pain has continued to worsen. Pain in back is worse with laying down, turning in bed and with prolonged sitting, pain eases with walking, meds PAIN:  Are you having pain? Yes: NPRS scale: 2/10 currently, 10/10 with turning in bed/10 Pain location: low back Pain description: sharp, tingling Aggravating factors: laying down, turning in bed, prolonged sitting Relieving factors: walking  PRECAUTIONS: None  WEIGHT BEARING RESTRICTIONS: No  FALLS:  Has patient fallen in last 6 months?  No   OCCUPATION: Payroll, desk job  PLOF: Independent  PATIENT GOALS:decrease pain  NEXT MD VISIT: 4/29  OBJECTIVE:   PATIENT SURVEYS:  FOTO 60   LUMBAR ROM:   Active  A/PROM  eval  Flexion Limited 25%  Extension WFL pain  Right lateral flexion Limited 25%  Left lateral flexion Limited 25%  Right rotation WFL  Left rotation WFL   (Blank rows = not tested)   UPPER EXTREMITY MMT:  MMT Right eval Left eval  Shoulder flexion 4+ 3 pain  Shoulder extension    Shoulder abduction 4+ 3 pain  Shoulder adduction    Shoulder internal rotation    Shoulder external rotation    Middle trapezius    Lower trapezius    Elbow flexion    Elbow extension    Wrist flexion    Wrist extension    Wrist ulnar deviation    Wrist radial deviation    Wrist pronation    Wrist supination    Grip strength (lbs)    (Blank rows = not tested) LOWER EXTREMITY MMT:    MMT Right eval Left eval  Hip flexion 4 pain 4 pain  Hip extension 3+ 3+  Hip abduction  4  Hip adduction    Hip internal rotation    Hip external rotation  4  Knee flexion    Knee extension    Ankle dorsiflexion    Ankle plantarflexion    Ankle inversion    Ankle eversion     (Blank rows = not tested)   Shoulder ROM WFL  PALPATION:  TTP SIJ CPAs and UPAs No TTP lumbar spine or piriformis   TODAY'S TREATMENT:  [facility] Adult PT Treatment:                                                DATE: 12/01/22 Therapeutic Exercise: Treadmill 1.25mph x 5 min for warm up Rows 15# 2 x 10 Shoulder ext 10# 2 x 10 Pallof 5# 2 x 10 bilat Elevated plank on mat table 3 x 20 sec Oblique isometric with ball 2 x 10 bilat,3 sec hold Sidelying reverse clam ball between knees x 20 bilat Sidelying clam ball between feet x 20 bilat  Modalities: TENS lumbar/SIJ x 10 min to tolerance Moist heat  lumbar/SIJ x 10 min    [facility] Adult PT Treatment:                                                DATE: 11/27/22 Therapeutic Exercise: Treadmill 1.39mph x 5 min for warm up Rows 15# 2 x 10 Shoulder extension 10# 2 x 10 Pallof 5# x 10 bilat Ab isometric supine with ball 2 x 10, 3 sec hold Oblique isometric with ball 2 x 10 bilat,3 sec hold Adductor squeeze with ball 2 x 10, 3 sec hold Supine clam green TB 2 x 10 Manual Therapy: STM glutes  Modalities: TENS lumbar/SIJ x 10 min to tolerance Moist heat lumbar/SIJ x 10 min   PATIENT EDUCATION: Education details: PT POC and goals, HEP, TENS Person educated: Patient Education method: Explanation, Demonstration, and Handouts Education comprehension: verbalized understanding and returned demonstration  HOME EXERCISE PROGRAM: Access Code: [email or web address] URL: [email or web address] Date: 12/01/2022 Prepared by: Isabelle Course  Exercises - Supine Hip Adduction Isometric with Ball  - 1 x daily - 7 x weekly - 2 sets - 10 reps - 3 sconds hold - Hooklying Clamshell with Resistance  - 1 x daily - 7 x weekly - 2 sets - 10 reps - Abdominal Press into [place]  - 1 x daily - 7 x weekly - 2 sets - 10 reps - 3 seconds hold - Supine 90/90 Alternating Toe Touch  - 1 x daily - 7 x weekly - 2 sets - 10 reps - 3 seconds hold - Standing Bilateral Low Shoulder Row with Anchored Resistance  - 1 x daily - 7 x weekly - 3 sets - 10 reps - Shoulder Extension with Resistance  - 1 x daily - 7 x weekly - 3 sets - 10 reps - Standing Anti-Rotation Press with Anchored Resistance  - 1 x daily - 7 x weekly - 3 sets - 10 reps - Plank with Hands on Table  - 1 x daily - 7 x weekly - 1 sets - 3 reps - 20 seconds hold  ASSESSMENT:  CLINICAL IMPRESSION: Pt with good tolerance to addition of plank and sidelying exercises. HEP updated  OBJECTIVE IMPAIRMENTS: decreased activity tolerance, decreased mobility, decreased ROM, decreased strength, impaired UE functional  use, and pain.    GOALS: Goals reviewed with patient? Yes  SHORT TERM GOALS: Target date: 12/08/2022    Pt will be independent with initial HEP Baseline: Goal status: INITIAL   LONG TERM GOALS: Target date: 01/05/2023    Pt will be independent with advanced HEP Baseline:  Goal status: INITIAL  2.  Pt will improve FOTO to >=  72 to demo improved functional mobility Baseline:  Goal status: INITIAL  3.  Pt will improve Lt shoulder strength to 4+/5 to improve tolerance to lifting and carrying items Baseline:  Goal status: INITIAL  4.  Pt will demo improved abdominal strength by being able to roll in bed with pain <= 2/10 Baseline:  Goal status: INITIAL   PLAN:  PT FREQUENCY: 2x/week  PT DURATION: 6 weeks  PLANNED INTERVENTIONS: Therapeutic exercises, Therapeutic activity, Neuromuscular re-education, Balance training, Gait training, Patient/Family education, Self Care, Joint mobilization, Aquatic Therapy, Dry Needling, Electrical stimulation, Cryotherapy, Moist heat, Taping, Ultrasound, Ionotophoresis 4mg /ml Dexamethasone, Manual therapy, and Re-evaluation  PLAN FOR NEXT SESSION: Add quadruped activities for core/shoulder strength, assess HEP, core strength   Sylvi Rybolt, PT 12/01/2022, 11:36 AM

## 2022-12-03 ENCOUNTER — Encounter: Payer: Self-pay | Admitting: Physical Therapy

## 2022-12-03 ENCOUNTER — Ambulatory Visit: Payer: BC Managed Care – PPO | Admitting: Physical Therapy

## 2022-12-03 DIAGNOSIS — G8929 Other chronic pain: Secondary | ICD-10-CM

## 2022-12-03 DIAGNOSIS — M5459 Other low back pain: Secondary | ICD-10-CM | POA: Diagnosis not present

## 2022-12-03 DIAGNOSIS — M25512 Pain in left shoulder: Secondary | ICD-10-CM | POA: Diagnosis not present

## 2022-12-03 DIAGNOSIS — M6281 Muscle weakness (generalized): Secondary | ICD-10-CM | POA: Diagnosis not present

## 2022-12-03 NOTE — Therapy (Signed)
OUTPATIENT PHYSICAL THERAPY    Patient Name: Courtney Costa MRN: [id number] DOB:July 06, 1976, 47 y.o., female Today's Date: 12/03/2022  END OF SESSION:  PT End of Session - 12/03/22 1101     Visit Number 4    Number of Visits 12    Date for PT Re-Evaluation 01/05/23    PT Start Time 1101    PT Stop Time 1145    PT Time Calculation (min) 44 min    Activity Tolerance Patient tolerated treatment well    Behavior During Therapy [facility] for tasks assessed/performed                Past Medical History:  Diagnosis Date   Medical history non-contributory    Spondylisthesis 05/16/2012   Spondylisthesis 05/16/2012   Past Surgical History:  Procedure Laterality Date   BREAST REDUCTION SURGERY  08/2014   COLONOSCOPY     CYSTOSCOPY N/A 10/09/2022   Procedure: CYSTOSCOPY;  Surgeon: Ardis Hughs, MD;  Location: [facility];  Service: Urology;  Laterality: N/A;   EYE SURGERY Bilateral    Lasik eye surgery   PUBOVAGINAL SLING N/A 10/09/2022   Procedure: MID-URETHRAL SLING;  Surgeon: Ardis Hughs, MD;  Location: [facility];  Service: Urology;  Laterality: N/A;   REDUCTION MAMMAPLASTY Bilateral    ROBOTIC ASSISTED LAPAROSCOPIC HYSTERECTOMY AND SALPINGECTOMY Bilateral 10/09/2022   Procedure: XI ROBOTIC ASSISTED LAPAROSCOPIC SUPRACERVICAL HYSTERECTOMY AND BILATERAL  SALPINGECTOMY;  Surgeon: Ardis Hughs, MD;  Location: [facility];  Service: Urology;  Laterality: Bilateral;  270 MINUTES NEEDED FOR CASE   ROBOTIC ASSISTED LAPAROSCOPIC SACROCOLPOPEXY N/A 10/09/2022   Procedure: XI ROBOTIC ASSISTED LAPAROSCOPIC SACROCOLPOPEXY;  Surgeon: Ardis Hughs, MD;  Location: [facility];  Service: Urology;  Laterality: N/A;   Patient Active Problem List   Diagnosis Date Noted   Mouth sores 11/27/2022   Chronic left shoulder pain 11/23/2022   Chronic bilateral low back pain 11/23/2022   Cystocele with prolapse 10/09/2022   Oligomenorrhea 02/05/2022   Abnormal weight gain 10/23/2021   Well  adult exam 11/22/2019   Bilateral wrist pain 11/14/2019   Menorrhagia 09/13/2019   SUI (stress urinary incontinence, female) 09/13/2019   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 Has continued to do her HEP and has felt a difference. Pt reports biggest discomfort is turning in bed.   PERTINENT HISTORY: Hysterectomy 2/24, breast reduction 2015  Pt states she has had Lt shoulder pain off and on for about 10 years. The pain has started to increase and she saw MD who recommended PT. She also has been having low back pain which has been going on for about 6 months. She had a hysterectomy with bladder lift about 6 weeks ago and thought the increase in back pain was due to that but she states the pain has continued to worsen. Pain in back is worse with laying down, turning in bed and with prolonged sitting, pain eases with walking, meds PAIN:  Are you having pain? Yes: NPRS scale: 2/10 currently, 10/10 with turning in bed/10 Pain location: low back Pain description: sharp, tingling Aggravating factors: turning in bed Relieving factors: walking  PRECAUTIONS: None  WEIGHT BEARING RESTRICTIONS: No  FALLS:   Has patient fallen in last 6 months? No   OCCUPATION: Payroll, desk job  PLOF: Independent  PATIENT GOALS:decrease pain  NEXT MD VISIT: 4/29  OBJECTIVE:   PATIENT SURVEYS:  FOTO 60   LUMBAR ROM:   Active  A/PROM  eval  Flexion Limited 25%  Extension WFL pain  Right lateral flexion Limited 25%  Left lateral flexion Limited 25%  Right rotation WFL  Left rotation WFL   (Blank rows = not tested)   UPPER EXTREMITY MMT:  MMT Right eval Left eval  Shoulder flexion 4+ 3 pain  Shoulder extension    Shoulder abduction 4+ 3 pain  Shoulder adduction    Shoulder internal rotation    Shoulder external rotation    Middle trapezius    Lower trapezius    Elbow flexion    Elbow extension    Wrist flexion    Wrist extension    Wrist ulnar deviation    Wrist radial deviation    Wrist pronation    Wrist supination    Grip strength (lbs)    (Blank rows = not tested) LOWER EXTREMITY MMT:    MMT Right eval Left eval  Hip flexion 4 pain 4 pain  Hip extension 3+ 3+  Hip abduction  4  Hip adduction    Hip internal rotation    Hip external rotation  4  Knee flexion    Knee extension    Ankle dorsiflexion    Ankle plantarflexion    Ankle inversion    Ankle eversion     (Blank rows = not tested)   Shoulder ROM WFL  PALPATION:  TTP SIJ CPAs and UPAs No TTP lumbar spine or piriformis   TODAY'S TREATMENT:            [facility] Adult PT Treatment:                                                DATE: 12/03/22 Therapeutic Exercise: Treadmill 1.55mph x 5 min for warm up LTR 2x30 sec Supine piriformis stretch 2x30 sec Supine PPT x10 Supine pball press one hand opposite hip flexion 2x10 Supine feet on pball L<>R rotations into center 2x10 Open book 2x10 Bridge 2x10x3 sec Modalities: TENS lumbar/SIJ x 15 min to tolerance Moist heat  lumbar/SIJ x 15 min                                                                                                                                 [facility] Adult PT Treatment:                                                DATE: 12/01/22 Therapeutic Exercise: Treadmill 1.89mph x 5 min for warm up Rows 15# 2 x 10 Shoulder ext 10# 2 x 10 Pallof 5# 2 x 10 bilat Elevated plank on mat table 3 x 20 sec Oblique isometric with ball 2 x 10 bilat,3 sec hold Sidelying reverse clam ball between knees x 20 bilat Sidelying clam ball between feet x 20 bilat  Modalities: TENS lumbar/SIJ x 10 min to tolerance Moist heat lumbar/SIJ x 10 min     PATIENT EDUCATION: Education details: PT POC and goals, HEP, TENS Person educated: Patient Education method: Explanation, Demonstration, and Handouts Education comprehension: verbalized understanding and returned demonstration  HOME EXERCISE PROGRAM: Access Code: [email or web address] URL: [email or web address] Date: 12/01/2022 Prepared by: Isabelle Course  Exercises - Supine Hip Adduction Isometric with Ball  - 1 x daily - 7 x weekly - 2 sets - 10 reps - 3 sconds hold - Hooklying Clamshell with Resistance  - 1 x daily - 7 x weekly - 2 sets - 10 reps - Abdominal Press into [place]  - 1 x daily - 7 x weekly - 2 sets - 10 reps - 3 seconds hold - Supine 90/90 Alternating Toe Touch  - 1 x daily - 7 x weekly - 2 sets - 10 reps - 3 seconds hold - Standing Bilateral Low Shoulder Row with Anchored Resistance  - 1 x daily - 7 x weekly - 3 sets - 10 reps - Shoulder Extension with Resistance  - 1 x daily - 7 x weekly - 3 sets - 10 reps - Standing Anti-Rotation Press with Anchored Resistance  - 1 x daily - 7 x weekly - 3 sets - 10 reps - Plank with Hands on Table  - 1 x daily - 7 x weekly - 1 sets - 3 reps - 20 seconds hold  ASSESSMENT:  CLINICAL IMPRESSION: Pt continues to tolerate exercises well. Current HEP is working well for pt; however, greatest functional difficulty/pain is with bed transfers/rolling. Working on improving oblique strength for rotations in bed as well as thoracic mobility. Pt tends to  bridge before turning in bed but compensates with increased lumbar extension -- worked on improving bridging with glute activation.   OBJECTIVE IMPAIRMENTS: decreased activity tolerance, decreased mobility, decreased ROM, decreased strength, impaired UE functional use, and pain.    GOALS: Goals reviewed with patient? Yes  SHORT TERM GOALS: Target date: 12/08/2022    Pt will be  independent with initial HEP Baseline: Goal status: INITIAL   LONG TERM GOALS: Target date: 01/05/2023    Pt will be independent with advanced HEP Baseline:  Goal status: INITIAL  2.  Pt will improve FOTO to >= 72 to demo improved functional mobility Baseline:  Goal status: INITIAL  3.  Pt will improve Lt shoulder strength to 4+/5 to improve tolerance to lifting and carrying items Baseline:  Goal status: INITIAL  4.  Pt will demo improved abdominal strength by being able to roll in bed with pain <= 2/10 Baseline:  Goal status: INITIAL   PLAN:  PT FREQUENCY: 2x/week  PT DURATION: 6 weeks  PLANNED INTERVENTIONS: Therapeutic exercises, Therapeutic activity, Neuromuscular re-education, Balance training, Gait training, Patient/Family education, Self Care, Joint mobilization, Aquatic Therapy, Dry Needling, Electrical stimulation, Cryotherapy, Moist heat, Taping, Ultrasound, Ionotophoresis 4mg /ml Dexamethasone, Manual therapy, and Re-evaluation  PLAN FOR NEXT SESSION: Continue bridging. Work on pain free bed transfers/rolling. Add quadruped activities for core/shoulder strength, assess HEP, core strength   Barre Aydelott April Ma L Oneida Mckamey, PT 12/03/2022, 11:02 AM

## 2022-12-08 ENCOUNTER — Encounter: Payer: Self-pay | Admitting: Physical Therapy

## 2022-12-08 ENCOUNTER — Ambulatory Visit: Payer: BC Managed Care – PPO | Attending: Sports Medicine | Admitting: Physical Therapy

## 2022-12-08 DIAGNOSIS — M5459 Other low back pain: Secondary | ICD-10-CM

## 2022-12-08 DIAGNOSIS — M25512 Pain in left shoulder: Secondary | ICD-10-CM | POA: Insufficient documentation

## 2022-12-08 DIAGNOSIS — G8929 Other chronic pain: Secondary | ICD-10-CM | POA: Diagnosis present

## 2022-12-08 DIAGNOSIS — M6281 Muscle weakness (generalized): Secondary | ICD-10-CM | POA: Diagnosis present

## 2022-12-08 NOTE — Therapy (Signed)
OUTPATIENT PHYSICAL THERAPY    Patient Name: Courtney Costa MRN: [id number] DOB:1975-12-13, 47 y.o., female Today's Date: 12/08/2022  END OF SESSION:  PT End of Session - 12/08/22 0919     Visit Number 5    Number of Visits 12    Date for PT Re-Evaluation 01/05/23    PT Start Time 0845    PT Stop Time 0930    PT Time Calculation (min) 45 min    Activity Tolerance Patient tolerated treatment well    Behavior During Therapy [facility] for tasks assessed/performed                 Past Medical History:  Diagnosis Date   Medical history non-contributory    Spondylisthesis 05/16/2012   Spondylisthesis 05/16/2012   Past Surgical History:  Procedure Laterality Date   BREAST REDUCTION SURGERY  08/2014   COLONOSCOPY     CYSTOSCOPY N/A 10/09/2022   Procedure: CYSTOSCOPY;  Surgeon: Ardis Hughs, MD;  Location: [facility];  Service: Urology;  Laterality: N/A;   EYE SURGERY Bilateral    Lasik eye surgery   PUBOVAGINAL SLING N/A 10/09/2022   Procedure: MID-URETHRAL SLING;  Surgeon: Ardis Hughs, MD;  Location: [facility];  Service: Urology;  Laterality: N/A;   REDUCTION MAMMAPLASTY Bilateral    ROBOTIC ASSISTED LAPAROSCOPIC HYSTERECTOMY AND SALPINGECTOMY Bilateral 10/09/2022   Procedure: XI ROBOTIC ASSISTED LAPAROSCOPIC SUPRACERVICAL HYSTERECTOMY AND BILATERAL  SALPINGECTOMY;  Surgeon: Ardis Hughs, MD;  Location: [facility];  Service: Urology;  Laterality: Bilateral;  270 MINUTES NEEDED FOR CASE   ROBOTIC ASSISTED LAPAROSCOPIC SACROCOLPOPEXY N/A 10/09/2022   Procedure: XI ROBOTIC ASSISTED LAPAROSCOPIC SACROCOLPOPEXY;  Surgeon: Ardis Hughs, MD;  Location: [facility];  Service: Urology;  Laterality: N/A;   Patient Active Problem List   Diagnosis Date Noted   Mouth sores 11/27/2022   Chronic left shoulder pain 11/23/2022   Chronic bilateral low back pain 11/23/2022   Cystocele with prolapse 10/09/2022   Oligomenorrhea 02/05/2022   Abnormal weight gain 10/23/2021   Well  adult exam 11/22/2019   Bilateral wrist pain 11/14/2019   Menorrhagia 09/13/2019   SUI (stress urinary incontinence, female) 09/13/2019   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 She has not done her exercises so had increased pain last night  PERTINENT HISTORY: Hysterectomy 2/24, breast reduction 2015  Pt states she has had Lt shoulder pain off and on for about 10 years. The pain has started to increase and she saw MD who recommended PT. She also has been having low back pain which has been going on for about 6 months. She had a hysterectomy with bladder lift about 6 weeks ago and thought the increase in back pain was due to that but she states the pain has continued to worsen. Pain in back is worse with laying down, turning in bed and with prolonged sitting, pain eases with walking, meds PAIN:  Are you having pain? Yes: NPRS scale: 2/10 currently, 10/10 with turning in bed/10 Pain location: low back Pain description: sharp, tingling Aggravating factors: turning in bed Relieving factors: walking  PRECAUTIONS: None  WEIGHT BEARING RESTRICTIONS: No  FALLS:  Has patient fallen in last 6 months? No   OCCUPATION: Payroll, desk job  PLOF:  Independent  PATIENT GOALS:decrease pain  NEXT MD VISIT: 4/29  OBJECTIVE:   PATIENT SURVEYS:  FOTO 60   LUMBAR ROM:   Active  A/PROM  eval  Flexion Limited 25%  Extension WFL pain  Right lateral flexion Limited 25%  Left lateral flexion Limited 25%  Right rotation WFL  Left rotation WFL   (Blank rows = not tested)   UPPER EXTREMITY MMT:  MMT Right eval Left eval  Shoulder flexion 4+ 3 pain  Shoulder extension    Shoulder abduction 4+ 3 pain  Shoulder adduction    Shoulder internal rotation    Shoulder external rotation    Middle trapezius    Lower trapezius    Elbow flexion    Elbow extension    Wrist flexion    Wrist extension    Wrist ulnar deviation    Wrist radial deviation    Wrist pronation    Wrist supination    Grip strength (lbs)    (Blank rows = not tested) LOWER EXTREMITY MMT:    MMT Right eval Left eval  Hip flexion 4 pain 4 pain  Hip extension 3+ 3+  Hip abduction  4  Hip adduction    Hip internal rotation    Hip external rotation  4  Knee flexion    Knee extension    Ankle dorsiflexion    Ankle plantarflexion    Ankle inversion    Ankle eversion     (Blank rows = not tested)   Shoulder ROM WFL  PALPATION:  TTP SIJ CPAs and UPAs No TTP lumbar spine or piriformis   TODAY'S TREATMENT:            [facility] Adult PT Treatment:                                                DATE: 12/08/22 Therapeutic Exercise: Treadmill 1.33mph x 5 min for warm up LTR 2 x 30 sec bilat Supine PPT x 10 Bridge 2 x 10 Open book x 10 bilat red TB Chops/lifts 5# x 10 each bilat Rows 15# 2 x 10  Modalities: TENS lumbar/SIJ x 10 min to tolerance Moist heat lumbar/SIJ x 10 min   [facility] Adult PT Treatment:  DATE: 12/03/22 Therapeutic Exercise: Treadmill 1.48mph x 5 min for warm up LTR 2x30 sec Supine piriformis stretch 2x30 sec Supine PPT x10 Supine pball press one hand opposite hip flexion 2x10 Supine feet  on pball L<>R rotations into center 2x10 Open book 2x10 Bridge 2x10x3 sec Modalities: TENS lumbar/SIJ x 15 min to tolerance Moist heat lumbar/SIJ x 15 min    PATIENT EDUCATION: Education details: PT POC and goals, HEP, TENS Person educated: Patient Education method: Explanation, Demonstration, and Handouts Education comprehension: verbalized understanding and returned demonstration  HOME EXERCISE PROGRAM: Access Code: [email or web address] URL: [email or web address] Date: 12/01/2022 Prepared by: Isabelle Course  Exercises - Supine Hip Adduction Isometric with Ball  - 1 x daily - 7 x weekly - 2 sets - 10 reps - 3 sconds hold - Hooklying Clamshell with Resistance  - 1 x daily - 7 x weekly - 2 sets - 10 reps - Abdominal Press into Bl[place]  - 1 x daily - 7 x weekly - 2 sets - 10 reps - 3 seconds hold - Supine 90/90 Alternating Toe Touch  - 1 x daily - 7 x weekly - 2 sets - 10 reps - 3 seconds hold - Standing Bilateral Low Shoulder Row with Anchored Resistance  - 1 x daily - 7 x weekly - 3 sets - 10 reps - Shoulder Extension with Resistance  - 1 x daily - 7 x weekly - 3 sets - 10 reps - Standing Anti-Rotation Press with Anchored Resistance  - 1 x daily - 7 x weekly - 3 sets - 10 reps - Plank with Hands on Table  - 1 x daily - 7 x weekly - 1 sets - 3 reps - 20 seconds hold  ASSESSMENT:  CLINICAL IMPRESSION: Pt with good tolerance to addition of chops/lifts for continued work on rotation. She had pain with returning to center from LTR to Lt, improved with cues for core contraction during movement  OBJECTIVE IMPAIRMENTS: decreased activity tolerance, decreased mobility, decreased ROM, decreased strength, impaired UE functional use, and pain.    GOALS: Goals reviewed with patient? Yes  SHORT TERM GOALS: Target date: 12/08/2022    Pt will be independent with initial HEP Baseline: Goal status: MET   LONG TERM GOALS: Target date: 01/05/2023    Pt will be independent with advanced  HEP Baseline:  Goal status: INITIAL  2.  Pt will improve FOTO to >= 72 to demo improved functional mobility Baseline:  Goal status: INITIAL  3.  Pt will improve Lt shoulder strength to 4+/5 to improve tolerance to lifting and carrying items Baseline:  Goal status: INITIAL  4.  Pt will demo improved abdominal strength by being able to roll in bed with pain <= 2/10 Baseline:  Goal status: INITIAL   PLAN:  PT FREQUENCY: 2x/week  PT DURATION: 6 weeks  PLANNED INTERVENTIONS: Therapeutic exercises, Therapeutic activity, Neuromuscular re-education, Balance training, Gait training, Patient/Family education, Self Care, Joint mobilization, Aquatic Therapy, Dry Needling, Electrical stimulation, Cryotherapy, Moist heat, Taping, Ultrasound, Ionotophoresis 4mg /ml Dexamethasone, Manual therapy, and Re-evaluation  PLAN FOR NEXT SESSION: Continue bridging. Work on pain free bed transfers/rolling. Add quadruped activities for core/shoulder strength, assess HEP, core strength   Millicent Blazejewski, PT 12/08/2022, 9:20 AM

## 2022-12-11 ENCOUNTER — Ambulatory Visit: Payer: BC Managed Care – PPO | Admitting: Physical Therapy

## 2022-12-11 ENCOUNTER — Encounter: Payer: Self-pay | Admitting: Physical Therapy

## 2022-12-11 DIAGNOSIS — M5459 Other low back pain: Secondary | ICD-10-CM | POA: Diagnosis not present

## 2022-12-11 DIAGNOSIS — M25512 Pain in left shoulder: Secondary | ICD-10-CM | POA: Diagnosis not present

## 2022-12-11 DIAGNOSIS — G8929 Other chronic pain: Secondary | ICD-10-CM

## 2022-12-11 DIAGNOSIS — M6281 Muscle weakness (generalized): Secondary | ICD-10-CM

## 2022-12-11 NOTE — Therapy (Signed)
OUTPATIENT PHYSICAL THERAPY    Patient Name: Courtney Costa MRN: [id number] DOB:April 05, 1976, 47 y.o., female Today's Date: 12/11/2022  END OF SESSION:  PT End of Session - 12/11/22 1007     Visit Number 6    Number of Visits 12    Date for PT Re-Evaluation 01/05/23    PT Start Time 0930    PT Stop Time 1015    PT Time Calculation (min) 45 min    Activity Tolerance Patient tolerated treatment well    [facility] for tasks assessed/performed                  Past Medical History:  Diagnosis Date   Medical history non-contributory    Spondylisthesis 05/16/2012   Spondylisthesis 05/16/2012   Past Surgical History:  Procedure Laterality Date   BREAST REDUCTION SURGERY  08/2014   COLONOSCOPY     CYSTOSCOPY N/A 10/09/2022   Procedure: CYSTOSCOPY;  Surgeon: Crist FatHerrick, Benjamin W, MD;  Location: [facility];  Service: Urology;  Laterality: N/A;   EYE SURGERY Bilateral    Lasik eye surgery   PUBOVAGINAL SLING N/A 10/09/2022   Procedure: MID-URETHRAL SLING;  Surgeon: Crist FatHerrick, Benjamin W, MD;  Location: [facility];  Service: Urology;  Laterality: N/A;   REDUCTION MAMMAPLASTY Bilateral    ROBOTIC ASSISTED LAPAROSCOPIC HYSTERECTOMY AND SALPINGECTOMY Bilateral 10/09/2022   Procedure: XI ROBOTIC ASSISTED LAPAROSCOPIC SUPRACERVICAL HYSTERECTOMY AND BILATERAL  SALPINGECTOMY;  Surgeon: Crist FatHerrick, Benjamin W, MD;  Location: [facility];  Service: Urology;  Laterality: Bilateral;  270 MINUTES NEEDED FOR CASE   ROBOTIC ASSISTED LAPAROSCOPIC SACROCOLPOPEXY N/A 10/09/2022   Procedure: XI ROBOTIC ASSISTED LAPAROSCOPIC SACROCOLPOPEXY;  Surgeon: Crist FatHerrick, Benjamin W, MD;  Location: [facility];  Service: Urology;  Laterality: N/A;   Patient Active Problem List   Diagnosis Date Noted   Mouth sores 11/27/2022   Chronic left shoulder pain 11/23/2022   Chronic bilateral low back pain 11/23/2022   Cystocele with prolapse 10/09/2022   Oligomenorrhea 02/05/2022   Abnormal weight gain 10/23/2021    Well adult exam 11/22/2019   Bilateral wrist pain 11/14/2019   Menorrhagia 09/13/2019   SUI (stress urinary incontinence, female) 09/13/2019   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, midline 09/13/2019   S/P bilateral breast reduction 04/28/2016   Obesity 02/21/2016   Anxiety as acute reaction to exceptional stress 07/02/2014   Insomnia 07/02/2014   Depression 07/02/2014   Spondylisthesis 05/16/2012    PCP: Everrett CoombeMatthews, [place]  REFERRING PROVIDER: Verdell Carminehekkekandam  REFERRING DIAG: chronic Lt shoulder pain  THERAPY DIAG:  Other low back pain  Chronic left shoulder pain  Muscle weakness (generalized)  Rationale for Evaluation and Treatment: Rehabilitation  ONSET DATE: 11/2022  SUBJECTIVE:  SUBJECTIVE STATEMENT: Pt states her back feels stiff mostly with rolling after being flat on her back  PERTINENT HISTORY: Hysterectomy 2/24, breast reduction 2015  Pt states she has had Lt shoulder pain off and on for about 10 years. The pain has started to increase and she saw MD who recommended PT. She also has been having low back pain which has been going on for about 6 months. She had a hysterectomy with bladder lift about 6 weeks ago and thought the increase in back pain was due to that but she states the pain has continued to worsen. Pain in back is worse with laying down, turning in bed and with prolonged sitting, pain eases with walking, meds PAIN:  Are you having pain? Yes: NPRS scale: 2/10 currently, 10/10 with turning in bed/10 Pain location: low back Pain description: sharp, tingling Aggravating factors: turning in bed Relieving factors: walking  PRECAUTIONS: None  WEIGHT BEARING RESTRICTIONS: No  FALLS:  Has patient fallen in last 6 months? No   OCCUPATION: Payroll, desk job  PLOF:  Independent  PATIENT GOALS:decrease pain  NEXT MD VISIT: 4/29  OBJECTIVE:   PATIENT SURVEYS:  FOTO 60   LUMBAR ROM:   Active  A/PROM  eval  Flexion Limited 25%  Extension WFL pain  Right lateral flexion Limited 25%  Left lateral flexion Limited 25%  Right rotation WFL  Left rotation WFL   (Blank rows = not tested)   UPPER EXTREMITY MMT:  MMT Right eval Left eval  Shoulder flexion 4+ 3 pain  Shoulder extension    Shoulder abduction 4+ 3 pain  Shoulder adduction    Shoulder internal rotation    Shoulder external rotation    Middle trapezius    Lower trapezius    Elbow flexion    Elbow extension    Wrist flexion    Wrist extension    Wrist ulnar deviation    Wrist radial deviation    Wrist pronation    Wrist supination    Grip strength (lbs)    (Blank rows = not tested) LOWER EXTREMITY MMT:    MMT Right eval Left eval  Hip flexion 4 pain 4 pain  Hip extension 3+ 3+  Hip abduction  4  Hip adduction    Hip internal rotation    Hip external rotation  4  Knee flexion    Knee extension    Ankle dorsiflexion    Ankle plantarflexion    Ankle inversion    Ankle eversion     (Blank rows = not tested)   Shoulder ROM WFL  PALPATION:  TTP SIJ CPAs and UPAs No TTP lumbar spine or piriformis   TODAY'S TREATMENT:            [facility] Adult PT Treatment:                                                DATE: 12/11/22 Therapeutic Exercise: Treadmill 1.6mph x 5 min for warm up Rows 15# 2 x 10 Chops/lifts 5# x 10 bilat Shoulder ext pull down with bar 15# (7.5# each side) 2 x 10 Antirotation with dowel and red WOD band marching x 10 bilat LTR 2 x 30 sec bilat Sciatic nerve flossing supine with ankle DF/PF and with knee flex/ext x 10 each Bridge 2 x 10 Modalities: TENS lumbar/SIJ x 10 min to  tolerance Moist heat lumbar/SIJ x 10 min   [facility] Adult PT Treatment:                                                DATE: 12/08/22 Therapeutic Exercise: Treadmill 1. x 5  min for warm up LTR 2 x 30 sec bilat Supine PPT x 10 Bridge 2 x 10 Open book x 10 bilat red TB Chops/lifts 5# x 10 each bilat Rows 15# 2 x 10  Modalities: TENS lumbar/SIJ x 10 min to tolerance Moist heat lumbar/SIJ x 10 min    PATIENT EDUCATION: Education details: PT POC and goals, HEP, TENS Person educated: Patient Education method: Explanation, Demonstration, and Handouts Education comprehension: verbalized understanding and returned demonstration  HOME EXERCISE PROGRAM: Access Code: [email or web address] URL: [email or web address] Date: 12/01/2022 Prepared by: Reggy Eye  Exercises - Supine Hip Adduction Isometric with Ball  - 1 x daily - 7 x weekly - 2 sets - 10 reps - 3 sconds hold - Hooklying Clamshell with Resistance  - 1 x daily - 7 x weekly - 2 sets - 10 reps - Abdominal Press into [place]  - 1 x daily - 7 x weekly - 2 sets - 10 reps - 3 seconds hold - Supine 90/90 Alternating Toe Touch  - 1 x daily - 7 x weekly - 2 sets - 10 reps - 3 seconds hold - Standing Bilateral Low Shoulder Row with Anchored Resistance  - 1 x daily - 7 x weekly - 3 sets - 10 reps - Shoulder Extension with Resistance  - 1 x daily - 7 x weekly - 3 sets - 10 reps - Standing Anti-Rotation Press with Anchored Resistance  - 1 x daily - 7 x weekly - 3 sets - 10 reps - Plank with Hands on Table  - 1 x daily - 7 x weekly - 1 sets - 3 reps - 20 seconds hold  ASSESSMENT:  CLINICAL IMPRESSION: Pt with good tolerance to progression of core exercises today. Added sciatic nerve glide due to c/o "zinging" pain on Lt side during LTR  OBJECTIVE IMPAIRMENTS: decreased activity tolerance, decreased mobility, decreased ROM, decreased strength, impaired UE functional use, and pain.    GOALS: Goals reviewed with patient? Yes  SHORT TERM GOALS: Target date: 12/08/2022    Pt will be independent with initial HEP Baseline: Goal status: MET   LONG TERM GOALS: Target date: 01/05/2023    Pt will be  independent with advanced HEP Baseline:  Goal status: INITIAL  2.  Pt will improve FOTO to >= 72 to demo improved functional mobility Baseline:  Goal status: INITIAL  3.  Pt will improve Lt shoulder strength to 4+/5 to improve tolerance to lifting and carrying items Baseline:  Goal status: INITIAL  4.  Pt will demo improved abdominal strength by being able to roll in bed with pain <= 2/10 Baseline:  Goal status: INITIAL   PLAN:  PT FREQUENCY: 2x/week  PT DURATION: 6 weeks  PLANNED INTERVENTIONS: Therapeutic exercises, Therapeutic activity, Neuromuscular re-education, Balance training, Gait training, Patient/Family education, Self Care, Joint mobilization, Aquatic Therapy, Dry Needling, Electrical stimulation, Cryotherapy, Moist heat, Taping, Ultrasound, Ionotophoresis 4mg /ml Dexamethasone, Manual therapy, and Re-evaluation  PLAN FOR NEXT SESSION: Continue bridging. Work on pain free bed transfers/rolling. Add quadruped activities for core/shoulder strength, assess HEP, core strength   Courtney Costa, PT 12/11/2022, 10:07  AM  

## 2022-12-15 ENCOUNTER — Encounter: Payer: Self-pay | Admitting: Physical Therapy

## 2022-12-15 ENCOUNTER — Ambulatory Visit: Payer: BC Managed Care – PPO | Admitting: Physical Therapy

## 2022-12-15 DIAGNOSIS — M6281 Muscle weakness (generalized): Secondary | ICD-10-CM | POA: Diagnosis not present

## 2022-12-15 DIAGNOSIS — M5459 Other low back pain: Secondary | ICD-10-CM

## 2022-12-15 DIAGNOSIS — M25512 Pain in left shoulder: Secondary | ICD-10-CM | POA: Diagnosis not present

## 2022-12-15 DIAGNOSIS — G8929 Other chronic pain: Secondary | ICD-10-CM

## 2022-12-15 NOTE — Therapy (Signed)
OUTPATIENT PHYSICAL THERAPY    Patient Name: Courtney Costa MRN: [id number] DOB:1976/05/14, 47 y.o., female Today's Date: 12/15/2022  END OF SESSION:  PT End of Session - 12/15/22 0916     Visit Number 7    Number of Visits 12    Date for PT Re-Evaluation 01/05/23    Authorization Type BCBS    PT Start Time 0845    PT Stop Time 0930    PT Time Calculation (min) 45 min    Activity Tolerance Patient tolerated treatment well    Behavior [facility] for tasks assessed/performed                   Past Medical History:  Diagnosis Date   Medical history non-contributory    Spondylisthesis 05/16/2012   Spondylisthesis 05/16/2012   Past Surgical History:  Procedure Laterality Date   BREAST REDUCTION SURGERY  08/2014   COLONOSCOPY     CYSTOSCOPY N/A 10/09/2022   Procedure: CYSTOSCOPY;  Surgeon: Crist FatHerrick, Benjamin W, MD;  Location: [facility];  Service: Urology;  Laterality: N/A;   EYE SURGERY Bilateral    Lasik eye surgery   PUBOVAGINAL SLING N/A 10/09/2022   Procedure: MID-URETHRAL SLING;  Surgeon: Crist FatHerrick, Benjamin W, MD;  Location: [facility];  Service: Urology;  Laterality: N/A;   REDUCTION MAMMAPLASTY Bilateral    ROBOTIC ASSISTED LAPAROSCOPIC HYSTERECTOMY AND SALPINGECTOMY Bilateral 10/09/2022   Procedure: XI ROBOTIC ASSISTED LAPAROSCOPIC SUPRACERVICAL HYSTERECTOMY AND BILATERAL  SALPINGECTOMY;  Surgeon: Crist FatHerrick, Benjamin W, MD;  Location: [facility];  Service: Urology;  Laterality: Bilateral;  270 MINUTES NEEDED FOR CASE   ROBOTIC ASSISTED LAPAROSCOPIC SACROCOLPOPEXY N/A 10/09/2022   Procedure: XI ROBOTIC ASSISTED LAPAROSCOPIC SACROCOLPOPEXY;  Surgeon: Crist FatHerrick, Benjamin W, MD;  Location: [facility];  Service: Urology;  Laterality: N/A;   Patient Active Problem List   Diagnosis Date Noted   Mouth sores 11/27/2022   Chronic left shoulder pain 11/23/2022   Chronic bilateral low back pain 11/23/2022   Cystocele with prolapse 10/09/2022   Oligomenorrhea 02/05/2022    Abnormal weight gain 10/23/2021   Well adult exam 11/22/2019   Bilateral wrist pain 11/14/2019   Menorrhagia 09/13/2019   SUI (stress urinary incontinence, female) 09/13/2019   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, midline 09/13/2019   S/P bilateral breast reduction 04/28/2016   Obesity 02/21/2016   Anxiety as acute reaction to exceptional stress 07/02/2014   Insomnia 07/02/2014   Depression 07/02/2014   Spondylisthesis 05/16/2012    PCP: Everrett CoombeMatthews, [place]  REFERRING PROVIDER: Verdell Carminehekkekandam  REFERRING DIAG: chronic Lt shoulder pain  THERAPY DIAG:  Other low back pain  Chronic left shoulder pain  Muscle weakness (generalized)  Rationale for Evaluation and Treatment: Rehabilitation  ONSET DATE: 11/2022  SUBJECTIVE:  SUBJECTIVE STATEMENT: Pt states she has been performing HEP and it has been helping decrease pain with bed mobility  PERTINENT HISTORY: Hysterectomy 2/24, breast reduction 2015  Pt states she has had Lt shoulder pain off and on for about 10 years. The pain has started to increase and she saw MD who recommended PT. She also has been having low back pain which has been going on for about 6 months. She had a hysterectomy with bladder lift about 6 weeks ago and thought the increase in back pain was due to that but she states the pain has continued to worsen. Pain in back is worse with laying down, turning in bed and with prolonged sitting, pain eases with walking, meds PAIN:  Are you having pain? Yes: NPRS scale: 2/10 currently, 10/10 with turning in bed/10 Pain location: low back Pain description: sharp, tingling Aggravating factors: turning in bed Relieving factors: walking  PRECAUTIONS: None  WEIGHT BEARING RESTRICTIONS: No  FALLS:  Has patient fallen in last 6 months?  No   OCCUPATION: Payroll, desk job  PLOF: Independent  PATIENT GOALS:decrease pain  NEXT MD VISIT: 4/29  OBJECTIVE:   PATIENT SURVEYS:  FOTO 60   LUMBAR ROM:   Active  A/PROM  eval  Flexion Limited 25%  Extension WFL pain  Right lateral flexion Limited 25%  Left lateral flexion Limited 25%  Right rotation WFL  Left rotation WFL   (Blank rows = not tested)   UPPER EXTREMITY MMT:  MMT Right eval Left eval  Shoulder flexion 4+ 3 pain  Shoulder extension    Shoulder abduction 4+ 3 pain  Shoulder adduction    Shoulder internal rotation    Shoulder external rotation    Middle trapezius    Lower trapezius    Elbow flexion    Elbow extension    Wrist flexion    Wrist extension    Wrist ulnar deviation    Wrist radial deviation    Wrist pronation    Wrist supination    Grip strength (lbs)    (Blank rows = not tested) LOWER EXTREMITY MMT:    MMT Right eval Left eval  Hip flexion 4 pain 4 pain  Hip extension 3+ 3+  Hip abduction  4  Hip adduction    Hip internal rotation    Hip external rotation  4  Knee flexion    Knee extension    Ankle dorsiflexion    Ankle plantarflexion    Ankle inversion    Ankle eversion     (Blank rows = not tested)   Shoulder ROM WFL  PALPATION:  TTP SIJ CPAs and UPAs No TTP lumbar spine or piriformis   TODAY'S TREATMENT:            [facility] Adult PT Treatment:                                                DATE: 12/15/22 Therapeutic Exercise: Treadmill 1. x 5 min for warm up Rows 15# 2 x 10 Shoulder ext 15# 2 x 10 (7.5# each side) Trunk rotation 5# x 10 bilat Antirotation with dowel and red WOD band marching x 10 bilat Sciatic nerve glide seated 2 x 10 Open book 2 x 10 bilat Bridge 2 x 10 Modalities: TENS lumbar/SIJ x 10 min to tolerance Moist heat lumbar/SIJ x 10 min  [facility] Adult PT Treatment:                                                DATE: 12/11/22 Therapeutic Exercise: Treadmill 1. x 5 min for  warm up Rows 15# 2 x 10 Chops/lifts 5# x 10 bilat Shoulder ext pull down with bar 15# (7.5# each side) 2 x 10 Antirotation with dowel and red WOD band marching x 10 bilat LTR 2 x 30 sec bilat Sciatic nerve flossing supine with ankle DF/PF and with knee flex/ext x 10 each Bridge 2 x 10 Modalities: TENS lumbar/SIJ x 10 min to tolerance Moist heat lumbar/SIJ x 10 min     PATIENT EDUCATION: Education details: PT POC and goals, HEP, TENS Person educated: Patient Education method: Explanation, Demonstration, and Handouts Education comprehension: verbalized understanding and returned demonstration  HOME EXERCISE PROGRAM: Access Code: [email or web address] URL: [email or web address] Date: 12/01/2022 Prepared by: Reggy Eye  Exercises - Supine Hip Adduction Isometric with Ball  - 1 x daily - 7 x weekly - 2 sets - 10 reps - 3 sconds hold - Hooklying Clamshell with Resistance  - 1 x daily - 7 x weekly - 2 sets - 10 reps - Abdominal Press into [place]  - 1 x daily - 7 x weekly - 2 sets - 10 reps - 3 seconds hold - Supine 90/90 Alternating Toe Touch  - 1 x daily - 7 x weekly - 2 sets - 10 reps - 3 seconds hold - Standing Bilateral Low Shoulder Row with Anchored Resistance  - 1 x daily - 7 x weekly - 3 sets - 10 reps - Shoulder Extension with Resistance  - 1 x daily - 7 x weekly - 3 sets - 10 reps - Standing Anti-Rotation Press with Anchored Resistance  - 1 x daily - 7 x weekly - 3 sets - 10 reps - Plank with Hands on Table  - 1 x daily - 7 x weekly - 1 sets - 3 reps - 20 seconds hold  ASSESSMENT:  CLINICAL IMPRESSION: Pt with good relief with seated sciatic nerve glide and open books. Pt is progressing well towards goals  OBJECTIVE IMPAIRMENTS: decreased activity tolerance, decreased mobility, decreased ROM, decreased strength, impaired UE functional use, and pain.    GOALS: Goals reviewed with patient? Yes  SHORT TERM GOALS: Target date: 12/08/2022    Pt will be independent  with initial HEP Baseline: Goal status: MET   LONG TERM GOALS: Target date: 01/05/2023    Pt will be independent with advanced HEP Baseline:  Goal status: INITIAL  2.  Pt will improve FOTO to >= 72 to demo improved functional mobility Baseline:  Goal status: INITIAL  3.  Pt will improve Lt shoulder strength to 4+/5 to improve tolerance to lifting and carrying items Baseline:  Goal status: INITIAL  4.  Pt will demo improved abdominal strength by being able to roll in bed with pain <= 2/10 Baseline:  Goal status: INITIAL   PLAN:  PT FREQUENCY: 2x/week  PT DURATION: 6 weeks  PLANNED INTERVENTIONS: Therapeutic exercises, Therapeutic activity, Neuromuscular re-education, Balance training, Gait training, Patient/Family education, Self Care, Joint mobilization, Aquatic Therapy, Dry Needling, Electrical stimulation, Cryotherapy, Moist heat, Taping, Ultrasound, Ionotophoresis 4mg /ml Dexamethasone, Manual therapy, and Re-evaluation  PLAN FOR NEXT SESSION: Add quadruped activities for core/shoulder strength, assess HEP, core strength   Declan Adamson,  PT 12/15/2022, 9:16 AM

## 2022-12-18 ENCOUNTER — Encounter: Payer: Self-pay | Admitting: Physical Therapy

## 2022-12-18 ENCOUNTER — Ambulatory Visit: Payer: BC Managed Care – PPO | Admitting: Physical Therapy

## 2022-12-18 DIAGNOSIS — M6281 Muscle weakness (generalized): Secondary | ICD-10-CM

## 2022-12-18 DIAGNOSIS — M5459 Other low back pain: Secondary | ICD-10-CM | POA: Diagnosis not present

## 2022-12-18 DIAGNOSIS — G8929 Other chronic pain: Secondary | ICD-10-CM | POA: Diagnosis not present

## 2022-12-18 DIAGNOSIS — M25512 Pain in left shoulder: Secondary | ICD-10-CM | POA: Diagnosis not present

## 2022-12-18 NOTE — Therapy (Signed)
OUTPATIENT PHYSICAL THERAPY    Patient Name: Courtney Costa MRN: [id number] DOB:1975/09/30, 47 y.o., female Today's Date: 12/18/2022  END OF SESSION:  PT End of Session - 12/18/22 0801     Visit Number 8    Number of Visits 12    Date for PT Re-Evaluation 01/05/23    Authorization Type BCBS    PT Start Time 0801    PT Stop Time 0845    PT Time Calculation (min) 44 min    Activity Tolerance Patient tolerated treatment well    Behavior During Therapy [facility] for tasks assessed/performed             Past Medical History:  Diagnosis Date   Medical history non-contributory    Spondylisthesis 05/16/2012   Spondylisthesis 05/16/2012   Past Surgical History:  Procedure Laterality Date   BREAST REDUCTION SURGERY  08/2014   COLONOSCOPY     CYSTOSCOPY N/A 10/09/2022   Procedure: CYSTOSCOPY;  Surgeon: Crist Fat, MD;  Location: [facility];  Service: Urology;  Laterality: N/A;   EYE SURGERY Bilateral    Lasik eye surgery   PUBOVAGINAL SLING N/A 10/09/2022   Procedure: MID-URETHRAL SLING;  Surgeon: Crist Fat, MD;  Location: [facility];  Service: Urology;  Laterality: N/A;   REDUCTION MAMMAPLASTY Bilateral    ROBOTIC ASSISTED LAPAROSCOPIC HYSTERECTOMY AND SALPINGECTOMY Bilateral 10/09/2022   Procedure: XI ROBOTIC ASSISTED LAPAROSCOPIC SUPRACERVICAL HYSTERECTOMY AND BILATERAL  SALPINGECTOMY;  Surgeon: Crist Fat, MD;  Location: [facility];  Service: Urology;  Laterality: Bilateral;  270 MINUTES NEEDED FOR CASE   ROBOTIC ASSISTED LAPAROSCOPIC SACROCOLPOPEXY N/A 10/09/2022   Procedure: XI ROBOTIC ASSISTED LAPAROSCOPIC SACROCOLPOPEXY;  Surgeon: Crist Fat, MD;  Location: [facility];  Service: Urology;  Laterality: N/A;   Patient Active Problem List   Diagnosis Date Noted   Mouth sores 11/27/2022   Chronic left shoulder pain 11/23/2022   Chronic bilateral low back pain 11/23/2022   Cystocele with prolapse 10/09/2022   Oligomenorrhea 02/05/2022   Abnormal weight  gain 10/23/2021   Well adult exam 11/22/2019   Bilateral wrist pain 11/14/2019   Menorrhagia 09/13/2019   SUI (stress urinary incontinence, female) 09/13/2019   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 Pt reports her back is a little sore getting ready for the yard sale.   PERTINENT HISTORY: Hysterectomy 2/24, breast reduction 2015  Pt states she has had Lt shoulder pain off and on for about 10 years. The pain has started to increase and she saw MD who recommended PT. She also has been having low back pain which has been going on for about 6 months. She had a hysterectomy with bladder lift about 6 weeks ago and thought the increase in back pain was due to that but she states the pain has continued to worsen. Pain in back is worse with laying down, turning in bed and with prolonged sitting, pain eases with walking, meds PAIN:  Are you having pain? Yes: NPRS scale: 2/10 currently, 10/10 with turning in bed/10 Pain location: low back Pain description: sharp, tingling Aggravating factors: turning in bed Relieving factors: walking  PRECAUTIONS: None  WEIGHT BEARING RESTRICTIONS: No  FALLS:  Has patient fallen in last 6  months? No   OCCUPATION: Payroll, desk job  PLOF: Independent  PATIENT GOALS:decrease pain  NEXT MD VISIT: 4/29  OBJECTIVE:   PATIENT SURVEYS:  FOTO 60   LUMBAR ROM:   Active  A/PROM  eval  Flexion Limited 25%  Extension WFL pain  Right lateral flexion Limited 25%  Left lateral flexion Limited 25%  Right rotation WFL  Left rotation WFL   (Blank rows = not tested)   UPPER EXTREMITY MMT:  MMT Right eval Left eval  Shoulder flexion 4+ 3 pain  Shoulder extension    Shoulder abduction 4+ 3 pain  Shoulder adduction    Shoulder internal rotation    Shoulder external rotation    Middle trapezius    Lower trapezius    Elbow flexion    Elbow extension    Wrist flexion    Wrist extension    Wrist ulnar deviation    Wrist radial deviation    Wrist pronation    Wrist supination    Grip strength (lbs)    (Blank rows = not tested) LOWER EXTREMITY MMT:    MMT Right eval Left eval  Hip flexion 4 pain 4 pain  Hip extension 3+ 3+  Hip abduction  4  Hip adduction    Hip internal rotation    Hip external rotation  4  Knee flexion    Knee extension    Ankle dorsiflexion    Ankle plantarflexion    Ankle inversion    Ankle eversion     (Blank rows = not tested)   Shoulder ROM WFL  PALPATION:  TTP SIJ CPAs and UPAs No TTP lumbar spine or piriformis   TODAY'S TREATMENT:        [facility] Adult PT Treatment:                                                DATE: 12/18/22 Therapeutic Exercise: Treadmill 1. x 5 min for warm up Rows 15# 2 x 10 Shoulder ext 20# 2 x 10 Trunk anti rotation rotation 5# with side step + bar x 10 bilat Chops/lifts 5# 2x10 Sciatic nerve glide seated 2 x 10 Open book 2 x 10 bilat Dead bug 2x10 Modalities: TENS lumbar/SIJ x 10 min to tolerance Moist heat lumbar/SIJ x 10 min  [facility] Adult PT Treatment:                                                DATE: 12/15/22 Therapeutic Exercise: Treadmill 1. x 5 min for warm up Rows 15#  2 x 10 Shoulder ext 15# 2 x 10 (7.5# each side) Trunk rotation 5# x 10 bilat Antirotation with dowel and red WOD band marching x 10 bilat Sciatic nerve glide seated 2 x 10 Open book 2 x 10 bilat Bridge 2 x 10 Modalities: TENS lumbar/SIJ x 10 min to tolerance Moist heat lumbar/SIJ x 10 min    PATIENT EDUCATION: Education details: PT POC and goals, HEP, TENS Person educated: Patient Education method: Explanation, Demonstration, and Handouts Education comprehension: verbalized understanding and returned demonstration  HOME EXERCISE PROGRAM: Access Code: [email or web address] URL: [email or web address] Date: 12/01/2022 Prepared by: Reggy Eye  Exercises - Supine Hip Adduction Isometric with Ball  - 1 x daily - 7 x weekly - 2 sets - 10 reps - 3 sconds hold - Hooklying Clamshell with Resistance  - 1 x daily - 7 x weekly - 2 sets - 10 reps - Abdominal Press into [place]  - 1 x daily - 7 x weekly - 2 sets - 10 reps - 3 seconds hold - Supine 90/90 Alternating Toe Touch  - 1 x daily - 7 x weekly - 2 sets - 10 reps - 3 seconds hold - Standing Bilateral Low Shoulder Row with Anchored Resistance  - 1 x daily - 7 x weekly - 3 sets - 10 reps - Shoulder Extension with Resistance  - 1 x daily - 7 x weekly - 3 sets - 10 reps - Standing Anti-Rotation Press with Anchored Resistance  - 1 x daily - 7 x weekly - 3 sets - 10 reps - Plank with Hands on Table  - 1 x daily - 7 x weekly - 1 sets - 3 reps - 20 seconds hold  ASSESSMENT:  CLINICAL IMPRESSION: Continuing to progress core strengthening. Improving rotation.   OBJECTIVE IMPAIRMENTS: decreased activity tolerance, decreased mobility, decreased ROM, decreased strength, impaired UE functional use, and pain.    GOALS: Goals reviewed with patient? Yes  SHORT TERM GOALS: Target date: 12/08/2022    Pt will be independent with initial HEP Baseline: Goal status: MET   LONG TERM GOALS: Target date: 01/05/2023    Pt will be independent  with advanced HEP Baseline:  Goal status: INITIAL  2.  Pt will improve FOTO to >= 72 to demo improved functional mobility Baseline:  Goal status: INITIAL  3.  Pt will improve Lt shoulder strength to 4+/5 to improve tolerance to lifting and carrying items Baseline:  Goal status: INITIAL  4.  Pt will demo improved abdominal strength by being able to roll in bed with pain <= 2/10 Baseline:  Goal status: INITIAL   PLAN:  PT FREQUENCY: 2x/week  PT DURATION: 6 weeks  PLANNED INTERVENTIONS: Therapeutic exercises, Therapeutic activity, Neuromuscular re-education, Balance training, Gait training, Patient/Family education, Self Care, Joint mobilization, Aquatic Therapy, Dry Needling, Electrical stimulation, Cryotherapy, Moist heat, Taping, Ultrasound, Ionotophoresis /ml Dexamethasone, Manual therapy, and Re-evaluation  PLAN FOR NEXT SESSION: Add quadruped activities for core/shoulder strength, assess HEP, core strength   Kealie Barrie April Ma L Javia Dillow, PT 12/18/2022, 8:32 AM

## 2022-12-22 ENCOUNTER — Encounter: Payer: Self-pay | Admitting: Physical Therapy

## 2022-12-22 ENCOUNTER — Ambulatory Visit: Payer: BC Managed Care – PPO | Admitting: Physical Therapy

## 2022-12-22 DIAGNOSIS — G8929 Other chronic pain: Secondary | ICD-10-CM

## 2022-12-22 DIAGNOSIS — M5459 Other low back pain: Secondary | ICD-10-CM | POA: Diagnosis not present

## 2022-12-22 DIAGNOSIS — M6281 Muscle weakness (generalized): Secondary | ICD-10-CM | POA: Diagnosis not present

## 2022-12-22 DIAGNOSIS — M25512 Pain in left shoulder: Secondary | ICD-10-CM | POA: Diagnosis not present

## 2022-12-22 NOTE — Therapy (Signed)
OUTPATIENT PHYSICAL THERAPY    Patient Name: Courtney Costa MRN: [id number] DOB:December 03, 1975, 47 y.o., female Today's Date: 12/22/2022  END OF SESSION:  PT End of Session - 12/22/22 0925     Visit Number 9    Number of Visits 12    Date for PT Re-Evaluation 01/05/23    PT Start Time 0850    PT Stop Time 0935    PT Time Calculation (min) 45 min    Activity Tolerance Patient tolerated treatment well    Behavior During Therapy [facility] for tasks assessed/performed              Past Medical History:  Diagnosis Date   Medical history non-contributory    Spondylisthesis 05/16/2012   Spondylisthesis 05/16/2012   Past Surgical History:  Procedure Laterality Date   BREAST REDUCTION SURGERY  08/2014   COLONOSCOPY     CYSTOSCOPY N/A 10/09/2022   Procedure: CYSTOSCOPY;  Surgeon: Crist Fat, MD;  Location: [facility];  Service: Urology;  Laterality: N/A;   EYE SURGERY Bilateral    Lasik eye surgery   PUBOVAGINAL SLING N/A 10/09/2022   Procedure: MID-URETHRAL SLING;  Surgeon: Crist Fat, MD;  Location: [facility];  Service: Urology;  Laterality: N/A;   REDUCTION MAMMAPLASTY Bilateral    ROBOTIC ASSISTED LAPAROSCOPIC HYSTERECTOMY AND SALPINGECTOMY Bilateral 10/09/2022   Procedure: XI ROBOTIC ASSISTED LAPAROSCOPIC SUPRACERVICAL HYSTERECTOMY AND BILATERAL  SALPINGECTOMY;  Surgeon: Crist Fat, MD;  Location: [facility];  Service: Urology;  Laterality: Bilateral;  270 MINUTES NEEDED FOR CASE   ROBOTIC ASSISTED LAPAROSCOPIC SACROCOLPOPEXY N/A 10/09/2022   Procedure: XI ROBOTIC ASSISTED LAPAROSCOPIC SACROCOLPOPEXY;  Surgeon: Crist Fat, MD;  Location: [facility];  Service: Urology;  Laterality: N/A;   Patient Active Problem List   Diagnosis Date Noted   Mouth sores 11/27/2022   Chronic left shoulder pain 11/23/2022   Chronic bilateral low back pain 11/23/2022   Cystocele with prolapse 10/09/2022   Oligomenorrhea 02/05/2022   Abnormal weight gain 10/23/2021   Well  adult exam 11/22/2019   Bilateral wrist pain 11/14/2019   Menorrhagia 09/13/2019   SUI (stress urinary incontinence, female) 09/13/2019   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, midline 09/13/2019   S/P bilateral breast reduction 04/28/2016   Obesity 02/21/2016   Anxiety as acute reaction to exceptional stress 07/02/2014   Insomnia 07/02/2014   Depression 07/02/2014   Spondylisthesis 05/16/2012    PCP: Everrett Coombe  REFERRING PROVIDER: Verdell Carmine DIAG: chronic Lt shoulder pain  THERAPY DIAG:  Other low back pain  Chronic left shoulder pain  Muscle weakness (generalized)  Rationale for Evaluation and Treatment: Rehabilitation  ONSET DATE: 11/2022  SUBJECTIVE:  SUBJECTIVE STATEMENT: Pt states she had a yard sale and then went to [employer] so she has been tight at night but is happy she was able to walk all day without pain  PERTINENT HISTORY: Hysterectomy 2/24, breast reduction 2015  Pt states she has had Lt shoulder pain off and on for about 10 years. The pain has started to increase and she saw MD who recommended PT. She also has been having low back pain which has been going on for about 6 months. She had a hysterectomy with bladder lift about 6 weeks ago and thought the increase in back pain was due to that but she states the pain has continued to worsen. Pain in back is worse with laying down, turning in bed and with prolonged sitting, pain eases with walking, meds PAIN:  Are you having pain? Yes: NPRS scale: 2/10 currently, 7/10 with turning in bed/10 Pain location: low back Pain description: sharp, tingling Aggravating factors: turning in bed Relieving factors: walking  PRECAUTIONS: None  WEIGHT BEARING RESTRICTIONS: No  FALLS:  Has patient fallen in last 6 months?  No   OCCUPATION: Payroll, desk job  PLOF: Independent  PATIENT GOALS:decrease pain  NEXT MD VISIT: 4/29  OBJECTIVE:   PATIENT SURVEYS:  FOTO 60   LUMBAR ROM:   Active  A/PROM  eval  Flexion Limited 25%  Extension WFL pain  Right lateral flexion Limited 25%  Left lateral flexion Limited 25%  Right rotation WFL  Left rotation WFL   (Blank rows = not tested)   UPPER EXTREMITY MMT:  MMT Right eval Left eval  Shoulder flexion 4+ 3 pain  Shoulder extension    Shoulder abduction 4+ 3 pain  Shoulder adduction    Shoulder internal rotation    Shoulder external rotation    Middle trapezius    Lower trapezius    Elbow flexion    Elbow extension    Wrist flexion    Wrist extension    Wrist ulnar deviation    Wrist radial deviation    Wrist pronation    Wrist supination    Grip strength (lbs)    (Blank rows = not tested) LOWER EXTREMITY MMT:    MMT Right eval Left eval  Hip flexion 4 pain 4 pain  Hip extension 3+ 3+  Hip abduction  4  Hip adduction    Hip internal rotation    Hip external rotation  4  Knee flexion    Knee extension    Ankle dorsiflexion    Ankle plantarflexion    Ankle inversion    Ankle eversion     (Blank rows = not tested)   Shoulder ROM WFL  PALPATION:  TTP SIJ CPAs and UPAs No TTP lumbar spine or piriformis   TODAY'S TREATMENT:        [facility] Adult PT Treatment:                                                DATE: 12/22/22 Therapeutic Exercise: Treadmill 1.8 mph grade 1 incline x 5 min for warm up Sciatic nerve glide seated 2 x 10 Quadruped cat/cow x 10 Quadruped thread the needle x 10 bilat Chops/lifts 5# 2x10 Antirotation with sidestep 5# x 10 bilat Open book x 10 bilat  Therapeutic Activity: Pt requests BP check due to feeling "dizzy and light headed" this morning.  BP 148/86 HR 62. Pt states this is higher than usual. PT suggests follow up with PCP, monitored symptoms throughout session  Modalities: TENS lumbar/SIJ x  10 min to tolerance Moist heat lumbar/SIJ x 10 min    [facility] Adult PT Treatment:                                                DATE: 12/18/22 Therapeutic Exercise: Treadmill 1. x 5 min for warm up Rows 15# 2 x 10 Shoulder ext 20# 2 x 10 Trunk anti rotation rotation 5# with side step + bar x 10 bilat Chops/lifts 5# 2x10 Sciatic nerve glide seated 2 x 10 Open book 2 x 10 bilat Dead bug 2x10 Modalities: TENS lumbar/SIJ x 10 min to tolerance Moist heat lumbar/SIJ x 10 min        PATIENT EDUCATION: Education details: PT POC and goals, HEP, TENS Person educated: Patient Education method: Explanation, Demonstration, and Handouts Education comprehension: verbalized understanding and returned demonstration  HOME EXERCISE PROGRAM: Access Code: [email or web address] URL: [email or web address] Date: 12/01/2022 Prepared by: Reggy Eye  Exercises - Supine Hip Adduction Isometric with Ball  - 1 x daily - 7 x weekly - 2 sets - 10 reps - 3 sconds hold - Hooklying Clamshell with Resistance  - 1 x daily - 7 x weekly - 2 sets - 10 reps - Abdominal Press into [place]  - 1 x daily - 7 x weekly - 2 sets - 10 reps - 3 seconds hold - Supine 90/90 Alternating Toe Touch  - 1 x daily - 7 x weekly - 2 sets - 10 reps - 3 seconds hold - Standing Bilateral Low Shoulder Row with Anchored Resistance  - 1 x daily - 7 x weekly - 3 sets - 10 reps - Shoulder Extension with Resistance  - 1 x daily - 7 x weekly - 3 sets - 10 reps - Standing Anti-Rotation Press with Anchored Resistance  - 1 x daily - 7 x weekly - 3 sets - 10 reps - Plank with Hands on Table  - 1 x daily - 7 x weekly - 1 sets - 3 reps - 20 seconds hold  ASSESSMENT:  CLINICAL IMPRESSION: Pt with good response to addition of quadruped exercises. Monitored BP symptoms throughout session and took rests as needed. Progressing towards goals  OBJECTIVE IMPAIRMENTS: decreased activity tolerance, decreased mobility, decreased ROM, decreased  strength, impaired UE functional use, and pain.    GOALS: Goals reviewed with patient? Yes  SHORT TERM GOALS: Target date: 12/08/2022    Pt will be independent with initial HEP Baseline: Goal status: MET   LONG TERM GOALS: Target date: 01/05/2023    Pt will be independent with advanced HEP Baseline:  Goal status: INITIAL  2.  Pt will improve FOTO to >= 72 to demo improved functional mobility Baseline:  Goal status: INITIAL  3.  Pt will improve Lt shoulder strength to 4+/5 to improve tolerance to lifting and carrying items Baseline:  Goal status: INITIAL  4.  Pt will demo improved abdominal strength by being able to roll in bed with pain <= 2/10 Baseline:  Goal status: INITIAL   PLAN:  PT FREQUENCY: 2x/week  PT DURATION: 6 weeks  PLANNED INTERVENTIONS: Therapeutic exercises, Therapeutic activity, Neuromuscular re-education, Balance training, Gait training, Patient/Family education, Self Care, Joint mobilization, Aquatic Therapy, Dry Needling, Electrical  stimulation, Cryotherapy, Moist heat, Taping, Ultrasound, Ionotophoresis /ml Dexamethasone, Manual therapy, and Re-evaluation  PLAN FOR NEXT SESSION: core/shoulder strength, assess HEP, core strength   Mossie Gilder, PT 12/22/2022, 9:26 AM

## 2022-12-25 ENCOUNTER — Encounter: Payer: Self-pay | Admitting: Physical Therapy

## 2022-12-25 ENCOUNTER — Ambulatory Visit: Payer: BC Managed Care – PPO | Admitting: Physical Therapy

## 2022-12-25 DIAGNOSIS — M5459 Other low back pain: Secondary | ICD-10-CM | POA: Diagnosis not present

## 2022-12-25 DIAGNOSIS — G8929 Other chronic pain: Secondary | ICD-10-CM | POA: Diagnosis not present

## 2022-12-25 DIAGNOSIS — M6281 Muscle weakness (generalized): Secondary | ICD-10-CM

## 2022-12-25 DIAGNOSIS — M25512 Pain in left shoulder: Secondary | ICD-10-CM | POA: Diagnosis not present

## 2022-12-25 NOTE — Therapy (Addendum)
OUTPATIENT PHYSICAL THERAPY DISCHARGE   Patient Name: Courtney Costa MRN: [id number] DOB:Jan 22, 1976, 47 y.o., female Today's Date: 12/25/2022  END OF SESSION:  PT End of Session - 12/25/22 0849     Visit Number 10    Number of Visits 12    Date for PT Re-Evaluation 01/05/23    PT Start Time 0849    PT Stop Time 0930    PT Time Calculation (min) 41 min    Activity Tolerance Patient tolerated treatment well    Behavior During Therapy [facility] for tasks assessed/performed              Past Medical History:  Diagnosis Date   Medical history non-contributory    Spondylisthesis 05/16/2012   Spondylisthesis 05/16/2012   Past Surgical History:  Procedure Laterality Date   BREAST REDUCTION SURGERY  08/2014   COLONOSCOPY     CYSTOSCOPY N/A 10/09/2022   Procedure: CYSTOSCOPY;  Surgeon: Crist Fat, MD;  Location: [facility];  Service: Urology;  Laterality: N/A;   EYE SURGERY Bilateral    Lasik eye surgery   PUBOVAGINAL SLING N/A 10/09/2022   Procedure: MID-URETHRAL SLING;  Surgeon: Crist Fat, MD;  Location: [facility];  Service: Urology;  Laterality: N/A;   REDUCTION MAMMAPLASTY Bilateral    ROBOTIC ASSISTED LAPAROSCOPIC HYSTERECTOMY AND SALPINGECTOMY Bilateral 10/09/2022   Procedure: XI ROBOTIC ASSISTED LAPAROSCOPIC SUPRACERVICAL HYSTERECTOMY AND BILATERAL  SALPINGECTOMY;  Surgeon: Crist Fat, MD;  Location: [facility];  Service: Urology;  Laterality: Bilateral;  270 MINUTES NEEDED FOR CASE   ROBOTIC ASSISTED LAPAROSCOPIC SACROCOLPOPEXY N/A 10/09/2022   Procedure: XI ROBOTIC ASSISTED LAPAROSCOPIC SACROCOLPOPEXY;  Surgeon: Crist Fat, MD;  Location: [facility];  Service: Urology;  Laterality: N/A;   Patient Active Problem List   Diagnosis Date Noted   Mouth sores 11/27/2022   Chronic left shoulder pain 11/23/2022   Chronic bilateral low back pain 11/23/2022   Cystocele with prolapse 10/09/2022   Oligomenorrhea 02/05/2022   Abnormal weight gain 10/23/2021    Well adult exam 11/22/2019   Bilateral wrist pain 11/14/2019   Menorrhagia 09/13/2019   SUI (stress urinary incontinence, female) 09/13/2019   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 No new complaints. Pt plans to see Dr. Karie Schwalbe 01/04/23.   PERTINENT HISTORY: Hysterectomy 2/24, breast reduction 2015  Pt states she has had Lt shoulder pain off and on for about 10 years. The pain has started to increase and she saw MD who recommended PT. She also has been having low back pain which has been going on for about 6 months. She had a hysterectomy with bladder lift about 6 weeks ago and thought the increase in back pain was due to that but she states the pain has continued to worsen. Pain in back is worse with laying down, turning in bed and with prolonged sitting, pain eases with walking, meds PAIN:  Are you having pain? Yes: NPRS scale: 2/10 currently, 7/10 with turning in bed/10 Pain location: low back Pain description: sharp, tingling Aggravating factors: turning in bed Relieving factors: walking  PRECAUTIONS: None  WEIGHT BEARING RESTRICTIONS: No  FALLS:  Has patient fallen in last 6 months? No  OCCUPATION: Payroll, desk job  PLOF:  Independent  PATIENT GOALS:decrease pain  NEXT MD VISIT: 4/29  OBJECTIVE:   PATIENT SURVEYS:  FOTO 60 12/25/22: 63   LUMBAR ROM:   Active  A/PROM  eval  Flexion Limited 25%  Extension WFL pain  Right lateral flexion Limited 25%  Left lateral flexion Limited 25%  Right rotation WFL  Left rotation WFL   (Blank rows = not tested)   UPPER EXTREMITY MMT:  MMT Right eval Left eval R/L 12/25/22  Shoulder flexion 4+ 3 pain 4+/4+ with mild pain  Shoulder extension     Shoulder abduction 4+ 3 pain 4+/4+  Shoulder adduction     Shoulder internal rotation     Shoulder external rotation     Middle trapezius     Lower trapezius     Elbow flexion     Elbow extension     Wrist flexion     Wrist extension     Wrist ulnar deviation     Wrist radial deviation     Wrist pronation     Wrist supination     Grip strength (lbs)     (Blank rows = not tested) LOWER EXTREMITY MMT:    MMT Right eval Left eval  Hip flexion 4 pain 4 pain  Hip extension 3+ 3+  Hip abduction  4  Hip adduction    Hip internal rotation    Hip external rotation  4  Knee flexion    Knee extension    Ankle dorsiflexion    Ankle plantarflexion    Ankle inversion    Ankle eversion     (Blank rows = not tested)   Shoulder ROM WFL  PALPATION:  TTP SIJ CPAs and UPAs No TTP lumbar spine or piriformis   TODAY'S TREATMENT:        [facility] Adult PT Treatment:                                                DATE: 12/25/22 Therapeutic Exercise: Treadmill 1.8 mph grade 1 incline x 5 min for warm up Row 15# pulleys 2x10 Lat pull down 15# short bar pulleys 2x10 Antirotation with sidestep 5# x 10 bilat Open book x 10 bilat Bridge x10 Single leg bridge 2x10 Therapeutic Activity: Single leg bridge + roll for modified rolling in  bed without pain Re-checked pt's goals   [facility] Adult PT Treatment:                                                DATE: 12/22/22 Therapeutic Exercise: Treadmill 1.8 mph grade 1  incline x 5 min for warm up Sciatic nerve glide seated 2 x 10 Quadruped cat/cow x 10 Quadruped thread the needle x 10 bilat Chops/lifts 5# 2x10 Antirotation with sidestep 5# x 10 bilat Open book x 10 bilat  Therapeutic Activity: Pt requests BP check due to feeling "dizzy and light headed" this morning. BP 148/86 HR 62. Pt states this is higher than usual. PT suggests follow up with PCP, monitored symptoms throughout session  Modalities: TENS lumbar/SIJ x 10 min to tolerance Moist heat lumbar/SIJ x 10 min    [facility] Adult PT Treatment:                                                DATE: 12/18/22 Therapeutic Exercise: Treadmill 1. x 5 min for warm up Rows 15# 2 x 10 Shoulder ext 20# 2 x 10 Trunk anti rotation rotation 5# with side step + bar x 10 bilat Chops/lifts 5# 2x10 Sciatic nerve glide seated 2 x 10 Open book 2 x 10 bilat Dead bug 2x10 Modalities: TENS lumbar/SIJ x 10 min to tolerance Moist heat lumbar/SIJ x 10 min        PATIENT EDUCATION: Education details: PT POC and goals, HEP, TENS Person educated: Patient Education method: Explanation, Demonstration, and Handouts Education comprehension: verbalized understanding and returned demonstration  HOME EXERCISE PROGRAM: Access Code: [email or web address] URL: [email or web address] Date: 12/01/2022 Prepared by: Reggy Eye  Exercises - Supine Hip Adduction Isometric with Ball  - 1 x daily - 7 x weekly - 2 sets - 10 reps - 3 sconds hold - Hooklying Clamshell with Resistance  - 1 x daily - 7 x weekly - 2 sets - 10 reps - Abdominal Press into [place]  - 1 x daily - 7 x weekly - 2 sets - 10 reps - 3 seconds hold - Supine 90/90 Alternating Toe Touch  - 1 x daily - 7 x weekly - 2 sets - 10 reps - 3 seconds hold - Standing Bilateral Low Shoulder Row with Anchored Resistance  - 1 x daily - 7 x weekly - 3 sets - 10 reps - Shoulder Extension with Resistance  - 1 x daily - 7 x weekly - 3 sets - 10 reps - Standing  Anti-Rotation Press with Anchored Resistance  - 1 x daily - 7 x weekly - 3 sets - 10 reps - Plank with Hands on Table  - 1 x daily - 7 x weekly - 1 sets - 3 reps - 20 seconds hold  ASSESSMENT:  CLINICAL IMPRESSION: Reviewed pt's HEP. Progressed bridging exercise for improved bed mobility with good pt tolerance. Went over how to perform rolling in bed in modified way without causing back pain. Pt notes no other complaints besides rolling and states back has been feeling okay otherwise. She reports some soreness after doing the yard sale but states she did a lot of lifting. Some mild pain with shoulder flexion. Pt has met or partially met all  of her LTGs. Discussed that if there is a need for further PT POC is not up until the end of April.   OBJECTIVE IMPAIRMENTS: decreased activity tolerance, decreased mobility, decreased ROM, decreased strength, impaired UE functional use, and pain.    GOALS: Goals reviewed with patient? Yes  SHORT TERM GOALS: Target date: 12/08/2022    Pt will be independent with initial HEP Baseline: Goal status: MET   LONG TERM GOALS: Target date: 01/05/2023    Pt will be independent with advanced HEP Baseline:  Goal status: MET  2.  Pt will improve FOTO to >= 72 to demo improved functional mobility Baseline:  12/25/22: 63 Goal status: PARTIALLY MET  3.  Pt will improve Lt shoulder strength to 4+/5 to improve tolerance to lifting and carrying items Baseline:  Goal status: MET  4.  Pt will demo improved abdominal strength by being able to roll in bed with pain <= 2/10 Baseline:  12/25/22: 7/10 pain with bed rolling when performed her "normal" way; no pain when cued to perform bridge and then roll Goal status: PARTIALLY MET   PLAN:  PT FREQUENCY: 2x/week  PT DURATION: 6 weeks  PLANNED INTERVENTIONS: Therapeutic exercises, Therapeutic activity, Neuromuscular re-education, Balance training, Gait training, Patient/Family education, Self Care, Joint  mobilization, Aquatic Therapy, Dry Needling, Electrical stimulation, Cryotherapy, Moist heat, Taping, Ultrasound, Ionotophoresis 4mg /ml Dexamethasone, Manual therapy, and Re-evaluation  PLAN FOR NEXT SESSION: core/shoulder strength, assess HEP, core strength  PHYSICAL THERAPY DISCHARGE SUMMARY  Visits from Start of Care: 10  Current functional level related to goals / functional outcomes: Decreased pain, improved strength   Remaining deficits: See above   Education / Equipment: HEP   Patient agrees to discharge. Patient goals were met. Patient is being discharged due to meeting the stated rehab goals.  Reggy Eye, PT,DPT05/14/243:17 PM   Vernon Prey April Ma L [place], PT 12/25/2022, 8:50 AM

## 2022-12-29 ENCOUNTER — Encounter: Payer: Self-pay | Admitting: Family Medicine

## 2022-12-29 ENCOUNTER — Ambulatory Visit: Payer: BC Managed Care – PPO | Admitting: Family Medicine

## 2022-12-29 VITALS — BP 137/84 | HR 70 | Temp 98.2°F | Ht 62.0 in | Wt 210.1 lb

## 2022-12-29 DIAGNOSIS — R42 Dizziness and giddiness: Secondary | ICD-10-CM

## 2022-12-29 MED ORDER — MECLIZINE HCL 25 MG PO TABS: 25.0000 mg | ORAL_TABLET | Freq: Three times a day (TID) | ORAL | 0 refills | Status: DC | PRN

## 2022-12-29 NOTE — Assessment & Plan Note (Signed)
47 year old female presents with concerns of dizziness.  She was concerned it was her blood pressure however in talking to her and hearing her history this is more likely an inner ear problem.  Patient did have some dizziness when trying to perform Dix-Hallpike maneuver but no nystagmus.  We did orthostatics in clinic that were negative.  I sent in a prescription of meclizine.  I have also sent a referral to vestibular therapy and recommended patient continue to increase fluids.

## 2022-12-29 NOTE — Progress Notes (Signed)
Acute Office Visit  Subjective:     Patient ID: Courtney Costa, female    DOB: 04-01-1976, 47 y.o.   MRN: [id number]  Chief Complaint  Patient presents with   Dizziness    Started 2 weeks ago, Episodes happens in the AM and throughout the day. She has taken home BP readings that are elevated in 140/150s.  She is nauseous with feelings of dizziness.     HPI Patient is in today for dizziness. She has been having lightheadedness and dizziness for about two weeks. She does admit to nausea with this.  When this event happened she will take her blood pressure and she will notice her blood pressure will be in the 150s and then 5 minutes later it will go down to the 130s.  She does not have a diagnosis of hypertension.  She says the episode happened again today when she was getting out of bed which is similar to her first experience.  Her second experience happened when she was blowing her nose really hard and had her head tilted down on a video call.   Review of Systems  Constitutional:  Negative for chills and fever.  Respiratory:  Negative for cough and shortness of breath.   Cardiovascular:  Negative for chest pain.  Neurological:  Positive for dizziness. Negative for headaches.      Objective:    BP 137/84   Pulse 70   Temp 98.2 F (36.8 C) (Oral)   Ht  (1.575 m)   Wt 210 lb 1.9 oz (95.3 kg)   LMP  (LMP Unknown)   SpO2 99%   BMI 38.43 kg/m    Physical Exam Vitals and nursing note reviewed.  Constitutional:      General: She is not in acute distress.    Appearance: Normal appearance.  HENT:     Head: Normocephalic and atraumatic.     Right Ear: External ear normal.     Left Ear: External ear normal.     Nose: Nose normal.  Eyes:     Conjunctiva/sclera: Conjunctivae normal.  Cardiovascular:     Rate and Rhythm: Normal rate and regular rhythm.  Pulmonary:     Effort: Pulmonary effort is normal.     Breath sounds: Normal breath sounds.  Neurological:      General: No focal deficit present.     Mental Status: She is alert and oriented to person, place, and time.     Comments: Negative dix hallpike but patient does have dizziness on sitting up.  Psychiatric:        Mood and Affect: Mood normal.        Behavior: Behavior normal.        Thought Content: Thought content normal.        Judgment: Judgment normal.     No results found for any visits on 12/29/22.      Assessment & Plan:   Problem List Items Addressed This Visit       Other   Dizziness - Primary    47 year old female presents with concerns of dizziness.  She was concerned it was her blood pressure however in talking to her and hearing her history this is more likely an inner ear problem.  Patient did have some dizziness when trying to perform Dix-Hallpike maneuver but no nystagmus.  We did orthostatics in clinic that were negative.  I sent in a prescription of meclizine.  I have also sent a referral to vestibular therapy  and recommended patient continue to increase fluids.      Relevant Orders   Ambulatory referral to Physical Therapy    Meds ordered this encounter  Medications   meclizine (ANTIVERT) 25 MG tablet    Sig: Take 1 tablet (25 mg total) by mouth 3 (three) times daily as needed for dizziness.    Dispense:  30 tablet    Refill:  0    Return if symptoms worsen or fail to improve.  Charlton Amor, DO

## 2022-12-30 ENCOUNTER — Other Ambulatory Visit: Payer: Self-pay | Admitting: Family Medicine

## 2023-01-04 ENCOUNTER — Ambulatory Visit: Payer: BC Managed Care – PPO | Admitting: Sports Medicine

## 2023-01-04 DIAGNOSIS — M25512 Pain in left shoulder: Secondary | ICD-10-CM | POA: Diagnosis not present

## 2023-01-04 DIAGNOSIS — R42 Dizziness and giddiness: Secondary | ICD-10-CM

## 2023-01-04 DIAGNOSIS — M545 Low back pain, unspecified: Secondary | ICD-10-CM

## 2023-01-04 DIAGNOSIS — G8929 Other chronic pain: Secondary | ICD-10-CM

## 2023-01-04 DIAGNOSIS — E66812 Obesity, class 2: Secondary | ICD-10-CM

## 2023-01-04 DIAGNOSIS — E6609 Other obesity due to excess calories: Secondary | ICD-10-CM | POA: Diagnosis not present

## 2023-01-04 DIAGNOSIS — Z6836 Body mass index (BMI) 36.0-36.9, adult: Secondary | ICD-10-CM

## 2023-01-04 NOTE — Assessment & Plan Note (Signed)
Chronic axial low back pain, she does have grade 2 spondylolisthesis L5-S1 as well as other degenerative changes, she improved considerably with formal PT, return as needed for this.

## 2023-01-04 NOTE — Progress Notes (Signed)
    Procedures performed today:    None.  Independent interpretation of notes and tests performed by another provider:   None.  Brief History, Exam, Impression, and Recommendations:    Chronic bilateral low back pain Chronic axial low back pain, she does have grade 2 spondylolisthesis L5-S1 as well as other degenerative changes, she improved considerably with formal PT, return as needed for this.  Chronic left shoulder pain Years of shoulder pain, labral signs on exam, better now with physical therapy. Continue therapy and return as needed.  Obesity She did wanted weight loss, she tells me she has a new weight loss plan with her new insurance, I would like her to discuss Zepbound with Dr. Ashley Royalty.  Dizziness Clinically represents benign positional vertigo, she is starting to do better but is starting vestibular rehab. We discussed the anatomy and pathophysiology of benign positional vertigo as well as the otolith theory, we discussed the Epley maneuver, she can follow this up with her PCP.  I spent 30 minutes of total time managing this patient today, this includes chart review, face to face, and non-face to face time.  ____________________________________________ Ihor Austin. Benjamin Stain, M.D., ABFM., CAQSM., AME. Primary Care and Sports Medicine [facility]  Adjunct Professor of Family Medicine  [employer] [employer]  Restaurant manager, fast food

## 2023-01-04 NOTE — Assessment & Plan Note (Signed)
Clinically represents benign positional vertigo, she is starting to do better but is starting vestibular rehab. We discussed the anatomy and pathophysiology of benign positional vertigo as well as the otolith theory, we discussed the Epley maneuver, she can follow this up with her PCP.

## 2023-01-04 NOTE — Assessment & Plan Note (Signed)
Years of shoulder pain, labral signs on exam, better now with physical therapy. Continue therapy and return as needed.

## 2023-01-04 NOTE — Assessment & Plan Note (Signed)
She did wanted weight loss, she tells me she has a new weight loss plan with her new insurance, I would like her to discuss Zepbound with Dr. Ashley Royalty.

## 2023-01-06 ENCOUNTER — Encounter: Payer: Self-pay | Admitting: Family Medicine

## 2023-01-06 ENCOUNTER — Ambulatory Visit (INDEPENDENT_AMBULATORY_CARE_PROVIDER_SITE_OTHER): Payer: BC Managed Care – PPO | Admitting: Family Medicine

## 2023-01-06 VITALS — BP 127/85 | HR 72 | Ht 62.0 in | Wt 208.0 lb

## 2023-01-06 DIAGNOSIS — R42 Dizziness and giddiness: Secondary | ICD-10-CM

## 2023-01-06 DIAGNOSIS — E7800 Pure hypercholesterolemia, unspecified: Secondary | ICD-10-CM | POA: Diagnosis not present

## 2023-01-06 DIAGNOSIS — R5383 Other fatigue: Secondary | ICD-10-CM | POA: Diagnosis not present

## 2023-01-06 DIAGNOSIS — R635 Abnormal weight gain: Secondary | ICD-10-CM | POA: Diagnosis not present

## 2023-01-06 DIAGNOSIS — Z1231 Encounter for screening mammogram for malignant neoplasm of breast: Secondary | ICD-10-CM | POA: Diagnosis not present

## 2023-01-06 DIAGNOSIS — E559 Vitamin D deficiency, unspecified: Secondary | ICD-10-CM | POA: Diagnosis not present

## 2023-01-06 DIAGNOSIS — R7309 Other abnormal glucose: Secondary | ICD-10-CM | POA: Diagnosis not present

## 2023-01-06 MED ORDER — SEMAGLUTIDE-WEIGHT MANAGEMENT 0.25 MG/0.5ML ~~LOC~~ SOAJ: 0.2500 mg | SUBCUTANEOUS | 0 refills | Status: DC

## 2023-01-06 MED ORDER — SEMAGLUTIDE-WEIGHT MANAGEMENT 0.5 MG/0.5ML ~~LOC~~ SOAJ: 0.5000 mg | SUBCUTANEOUS | 0 refills | Status: AC

## 2023-01-06 MED ORDER — SEMAGLUTIDE-WEIGHT MANAGEMENT 1 MG/0.5ML ~~LOC~~ SOAJ: 1.0000 mg | SUBCUTANEOUS | 0 refills | Status: AC

## 2023-01-06 NOTE — Assessment & Plan Note (Signed)
She is starting vestibular therapy.  She will continue with meclizine as needed.

## 2023-01-06 NOTE — Assessment & Plan Note (Signed)
We discussed adding Wegovy on.  She believes that her insurance does cover this at this point.  Reviewed working on dietary changes and incorporation of exercise for most effective weight loss from medication.

## 2023-01-06 NOTE — Assessment & Plan Note (Signed)
Checking labs for evaluation of fatigue. Orders Placed This Encounter  Procedures   MM 3D SCREENING MAMMOGRAM BILATERAL BREAST    Order Specific Question:   Reason for Exam (SYMPTOM  OR DIAGNOSIS REQUIRED)    Answer:   Screen for Breast Cancer    Order Specific Question:   Is the patient pregnant?    Answer:   No    Order Specific Question:   Preferred imaging location?    Answer:   Licensed conveyancer   COMPLETE METABOLIC PANEL WITH GFR   CBC with Differential   TSH   HgB A1c   Lipid Panel w/reflex Direct LDL   B12   Vitamin D (25 hydroxy)   Iron, TIBC and Ferritin Panel

## 2023-01-06 NOTE — Progress Notes (Signed)
Courtney Costa - 47 y.o. female MRN [id number]  Date of birth: 11/12/1975  Subjective Chief Complaint  Patient presents with   Weight Loss    HPI Embree Brawley is a 47 year old female here today for follow-up visit.    She was recently seen by Dr. Tamera Punt with complaint of dizziness.  Diagnosed with vertigo and has been referred to vestibular rehab.  She has not started this so far.  She was prescribed meclizine which does provide some relief.  She has noticed some increased fatigue associate with her dizziness.  Blood pressure was elevated when she had dizziness previously as well so she thought it may be related to this.  Her blood pressure is normal today.  She would like to have updated labs today including screening for diabetes.    She reports that her insurance now covers medication for weight loss including GLP-1's.  She would be interested in adding either Wegovy or zepbound.  She has difficulty with appetite and activity level.   ROS:  A comprehensive ROS was completed and negative except as noted per HPI  Allergies  Allergen Reactions   Vagisil [Anti-Itch Vaginal] Swelling   Benzocaine Swelling    Angioedema - can use lidocain   Meloxicam Rash    Mouth sores    Past Medical History:  Diagnosis Date   Medical history non-contributory    Spondylisthesis 05/16/2012   Spondylisthesis 05/16/2012    Past Surgical History:  Procedure Laterality Date   BREAST REDUCTION SURGERY  08/2014   COLONOSCOPY     CYSTOSCOPY N/A 10/09/2022   Procedure: CYSTOSCOPY;  Surgeon: Crist Fat, MD;  Location: [facility];  Service: Urology;  Laterality: N/A;   EYE SURGERY Bilateral    Lasik eye surgery   PUBOVAGINAL SLING N/A 10/09/2022   Procedure: MID-URETHRAL SLING;  Surgeon: Crist Fat, MD;  Location: [facility];  Service: Urology;  Laterality: N/A;   REDUCTION MAMMAPLASTY Bilateral    ROBOTIC ASSISTED LAPAROSCOPIC HYSTERECTOMY AND SALPINGECTOMY Bilateral 10/09/2022    Procedure: XI ROBOTIC ASSISTED LAPAROSCOPIC SUPRACERVICAL HYSTERECTOMY AND BILATERAL  SALPINGECTOMY;  Surgeon: Crist Fat, MD;  Location: [facility];  Service: Urology;  Laterality: Bilateral;  270 MINUTES NEEDED FOR CASE   ROBOTIC ASSISTED LAPAROSCOPIC SACROCOLPOPEXY N/A 10/09/2022   Procedure: XI ROBOTIC ASSISTED LAPAROSCOPIC SACROCOLPOPEXY;  Surgeon: Crist Fat, MD;  Location: [facility];  Service: Urology;  Laterality: N/A;    Social History   Socioeconomic History   Marital status: Married    Spouse name: Not on file   Number of children: Not on file   Years of education: Not on file   Highest education level: Bachelor's degree (e.g., BA, AB, BS)  Occupational History   Not on file  Tobacco Use   Smoking status: Never   Smokeless tobacco: Never  Vaping Use   Vaping Use: Never used  Substance and Sexual Activity   Alcohol use: No   Drug use: No   Sexual activity: Yes    Birth control/protection: None  Other Topics Concern   Not on file  Social History Narrative   Not on file   Social Determinants of Health   Financial Resource Strain: Low Risk  (01/04/2023)   Overall Financial Resource Strain (CARDIA)    Difficulty of Paying Living Expenses: Not very hard  Food Insecurity: No Food Insecurity (01/04/2023)   Hunger Vital Sign    Worried About Running Out of Food in the Last Year: Never true    Ran  Out of Food in the Last Year: Never true  Transportation Needs: No Transportation Needs (01/04/2023)   PRAPARE - Administrator, Civil Service (Medical): No    Lack of Transportation (Non-Medical): No  Physical Activity: Insufficiently Active (01/04/2023)   Exercise Vital Sign    Days of Exercise per Week: 1 day    Minutes of Exercise per Session: 30 min  Stress: Stress Concern Present (01/04/2023)   [employer] of Occupational Health - Occupational Stress Questionnaire    Feeling of Stress : To some extent  Social Connections: Socially Integrated  (01/04/2023)   Social Connection and Isolation Panel [NHANES]    Frequency of Communication with Friends and Family: More than three times a week    Frequency of Social Gatherings with Friends and Family: More than three times a week    Attends Religious Services: More than 4 times per year    Active Member of Clubs or Organizations: Yes    Attends Engineer, structural: More than 4 times per year    Marital Status: Married    Family History  Problem Relation Age of Onset   Hypertension Father    Psoriasis Father    Breast cancer Neg Hx     Health Maintenance  Topic Date Due   COVID-19 Vaccine (4 - 2023-24 season) 01/14/2023 (Originally 05/08/2022)   Hepatitis C Screening  01/06/2024 (Originally [birth year]16/1992)   MAMMOGRAM  02/06/2023   INFLUENZA VACCINE  04/08/2023   PAP SMEAR-Modifier  10/09/2025   DTaP/Tdap/Td (3 - Td or Tdap) 08/14/2026   COLONOSCOPY (Pts 45-58yrs Insurance coverage will need to be confirmed)  04/12/2031   HIV Screening  Completed   HPV VACCINES  Aged Out     ----------------------------------------------------------------------------------------------------------------------------------------------------------------------------------------------------------------- Physical Exam BP 127/85 (BP Location: Left Arm, Patient Position: Sitting, Cuff Size: Large)   Pulse 72   Ht 5\' 2"  (1.575 m)   Wt 208 lb (94.3 kg)   LMP 10/09/2022 (Exact Date)   SpO2 100%   BMI 38.04 kg/m   Physical Exam Constitutional:      Appearance: Normal appearance.  HENT:     Head: Normocephalic and atraumatic.  Eyes:     General: No scleral icterus. Cardiovascular:     Rate and Rhythm: Normal rate and regular rhythm.  Pulmonary:     Effort: Pulmonary effort is normal.     Breath sounds: Normal breath sounds.  Musculoskeletal:     Cervical back: Neck supple.  Neurological:     Mental Status: She is alert.  Psychiatric:        Mood and Affect: Mood normal.         Behavior: Behavior normal.     ------------------------------------------------------------------------------------------------------------------------------------------------------------------------------------------------------------------- Assessment and Plan  Dizziness She is starting vestibular therapy.  She will continue with meclizine as needed.  Other fatigue Checking labs for evaluation of fatigue. Orders Placed This Encounter  Procedures   MM 3D SCREENING MAMMOGRAM BILATERAL BREAST    Order Specific Question:   Reason for Exam (SYMPTOM  OR DIAGNOSIS REQUIRED)    Answer:   Screen for Breast Cancer    Order Specific Question:   Is the patient pregnant?    Answer:   No    Order Specific Question:   Preferred imaging location?    Answer:   Licensed conveyancer   COMPLETE METABOLIC PANEL WITH GFR   CBC with Differential   TSH   HgB A1c   Lipid Panel w/reflex Direct LDL   B12  Vitamin D (25 hydroxy)   Iron, TIBC and Ferritin Panel     Abnormal weight gain We discussed adding Wegovy on.  She believes that her insurance does cover this at this point.  Reviewed working on dietary changes and incorporation of exercise for most effective weight loss from medication.   Meds ordered this encounter  Medications   Semaglutide-Weight Management 0.25 MG/0.5ML SOAJ    Sig: Inject 0.25 mg into the skin once a week for 28 days.    Dispense:  2 mL    Refill:  0   Semaglutide-Weight Management 0.5 MG/0.5ML SOAJ    Sig: Inject 0.5 mg into the skin once a week for 28 days.    Dispense:  2 mL    Refill:  0   Semaglutide-Weight Management 1 MG/0.5ML SOAJ    Sig: Inject 1 mg into the skin once a week for 28 days.    Dispense:  2 mL    Refill:  0    No follow-ups on file.    This visit occurred during the SARS-CoV-2 public health emergency.  Safety protocols were in place, including screening questions prior to the visit, additional usage of staff PPE, and extensive cleaning  of exam room while observing appropriate contact time as indicated for disinfecting solutions.

## 2023-01-06 NOTE — Patient Instructions (Signed)
Let's plan to follow up 3 months after starting [facility].  We'll be in touch with lab results.

## 2023-01-07 LAB — COMPLETE METABOLIC PANEL WITH GFR
AG Ratio: 1.5 (calc) (ref 1.0–2.5)
ALT: 17 U/L (ref 6–29)
AST: 17 U/L (ref 10–35)
Albumin: 4.2 g/dL (ref 3.6–5.1)
Alkaline phosphatase (APISO): 93 U/L (ref 31–125)
BUN: 13 mg/dL (ref 7–25)
CO2: 24 mmol/L (ref 20–32)
Calcium: 9.4 mg/dL (ref 8.6–10.2)
Chloride: 105 mmol/L (ref 98–110)
Creat: 0.62 mg/dL (ref 0.50–0.99)
Globulin: 2.8 g/dL (calc) (ref 1.9–3.7)
Glucose, Bld: 80 mg/dL (ref 65–99)
Potassium: 4.3 mmol/L (ref 3.5–5.3)
Sodium: 140 mmol/L (ref 135–146)
Total Bilirubin: 0.5 mg/dL (ref 0.2–1.2)
Total Protein: 7 g/dL (ref 6.1–8.1)
eGFR: 110 mL/min/{1.73_m2} (ref >=60)

## 2023-01-07 LAB — HEMOGLOBIN A1C
Hgb A1c MFr Bld: 5.6 % of total Hgb (ref <5.7)
Mean Plasma Glucose: 114 mg/dL
eAG (mmol/L): 6.3 mmol/L

## 2023-01-07 LAB — CBC WITH DIFFERENTIAL/PLATELET
Absolute Monocytes: 360 cells/uL (ref 200–950)
Basophils Absolute: 41 cells/uL (ref 0–200)
Basophils Relative: 0.7 %
Eosinophils Absolute: 177 cells/uL (ref 15–500)
Eosinophils Relative: 3 %
HCT: 44 % (ref 35.0–45.0)
Hemoglobin: 14.3 g/dL (ref 11.7–15.5)
Lymphs Abs: 2195 cells/uL (ref 850–3900)
MCH: 27.9 pg (ref 27.0–33.0)
MCHC: 32.5 g/dL (ref 32.0–36.0)
MCV: 85.9 fL (ref 80.0–100.0)
MPV: 11 fL (ref 7.5–12.5)
Monocytes Relative: 6.1 %
Neutro Abs: 3127 cells/uL (ref 1500–7800)
Neutrophils Relative %: 53 %
Platelets: 276 10*3/uL (ref 140–400)
RBC: 5.12 10*6/uL — ABNORMAL HIGH (ref 3.80–5.10)
RDW: 13 % (ref 11.0–15.0)
Total Lymphocyte: 37.2 %
WBC: 5.9 10*3/uL (ref 3.8–10.8)

## 2023-01-07 LAB — VITAMIN B12: Vitamin B-12: 561 pg/mL (ref 200–1100)

## 2023-01-07 LAB — VITAMIN D 25 HYDROXY (VIT D DEFICIENCY, FRACTURES): Vit D, 25-Hydroxy: 12 ng/mL — ABNORMAL LOW (ref 30–100)

## 2023-01-07 LAB — LIPID PANEL W/REFLEX DIRECT LDL
Cholesterol: 238 mg/dL — ABNORMAL HIGH (ref <200)
HDL: 75 mg/dL (ref >=50)
LDL Cholesterol (Calc): 141 mg/dL (calc) — ABNORMAL HIGH
Non-HDL Cholesterol (Calc): 163 mg/dL (calc) — ABNORMAL HIGH (ref <130)
Total CHOL/HDL Ratio: 3.2 (calc) (ref <5.0)
Triglycerides: 105 mg/dL (ref <150)

## 2023-01-07 LAB — IRON,TIBC AND FERRITIN PANEL
%SAT: 12 % (calc) — ABNORMAL LOW (ref 16–45)
Ferritin: 15 ng/mL — ABNORMAL LOW (ref 16–232)
Iron: 50 ug/dL (ref 40–190)
TIBC: 416 mcg/dL (calc) (ref 250–450)

## 2023-01-07 LAB — TSH: TSH: 2.64 mIU/L

## 2023-01-12 ENCOUNTER — Ambulatory Visit: Payer: BC Managed Care – PPO | Attending: Family Medicine | Admitting: Physical Therapy

## 2023-01-12 ENCOUNTER — Other Ambulatory Visit: Payer: Self-pay

## 2023-01-12 DIAGNOSIS — R42 Dizziness and giddiness: Secondary | ICD-10-CM | POA: Insufficient documentation

## 2023-01-12 DIAGNOSIS — R2681 Unsteadiness on feet: Secondary | ICD-10-CM

## 2023-01-12 NOTE — Therapy (Signed)
OUTPATIENT PHYSICAL THERAPY VESTIBULAR EVALUATION     Patient Name: Courtney Costa MRN: [id number] DOB:27-Jan-1976, 47 y.o., female Today's Date: 01/12/2023  END OF SESSION:  PT End of Session - 01/12/23 0854     Visit Number 1    Authorization Type BCBS    PT Start Time 0850    PT Stop Time 0930    PT Time Calculation (min) 40 min    Activity Tolerance Patient tolerated treatment well             Past Medical History:  Diagnosis Date   Medical history non-contributory    Spondylisthesis 05/16/2012   Spondylisthesis 05/16/2012   Past Surgical History:  Procedure Laterality Date   BREAST REDUCTION SURGERY  08/2014   COLONOSCOPY     CYSTOSCOPY N/A 10/09/2022   Procedure: CYSTOSCOPY;  Surgeon: Crist Fat, MD;  Location: [facility];  Service: Urology;  Laterality: N/A;   EYE SURGERY Bilateral    Lasik eye surgery   PUBOVAGINAL SLING N/A 10/09/2022   Procedure: MID-URETHRAL SLING;  Surgeon: Crist Fat, MD;  Location: [facility];  Service: Urology;  Laterality: N/A;   REDUCTION MAMMAPLASTY Bilateral    ROBOTIC ASSISTED LAPAROSCOPIC HYSTERECTOMY AND SALPINGECTOMY Bilateral 10/09/2022   Procedure: XI ROBOTIC ASSISTED LAPAROSCOPIC SUPRACERVICAL HYSTERECTOMY AND BILATERAL  SALPINGECTOMY;  Surgeon: Crist Fat, MD;  Location: [facility];  Service: Urology;  Laterality: Bilateral;  270 MINUTES NEEDED FOR CASE   ROBOTIC ASSISTED LAPAROSCOPIC SACROCOLPOPEXY N/A 10/09/2022   Procedure: XI ROBOTIC ASSISTED LAPAROSCOPIC SACROCOLPOPEXY;  Surgeon: Crist Fat, MD;  Location: [facility];  Service: Urology;  Laterality: N/A;   Patient Active Problem List   Diagnosis Date Noted   Other fatigue 01/06/2023   Dizziness 12/29/2022   Mouth sores 11/27/2022   Chronic left shoulder pain 11/23/2022   Chronic bilateral low back pain 11/23/2022   Cystocele with prolapse 10/09/2022   Oligomenorrhea 02/05/2022   Abnormal weight gain 10/23/2021   Well adult exam 11/22/2019    Bilateral wrist pain 11/14/2019   Menorrhagia 09/13/2019   SUI (stress urinary incontinence, female) 09/13/2019   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 First noticed it in the morning and had a loss of balance. Pt felt her head was spinning. Has been feeling it with head turns.  Pt accompanied by: self  PERTINENT HISTORY: Back pain  PAIN:  Are you having pain? No  PRECAUTIONS: None  WEIGHT BEARING RESTRICTIONS: No  FALLS: Has patient fallen in last 6 months? No  LIVING ENVIRONMENT: Lives with: lives with their family Lives in: House/apartment  PLOF: Independent  PATIENT GOALS: Get rid of dizziness  OBJECTIVE:   DIAGNOSTIC FINDINGS: n/a  COGNITION: Overall cognitive status: Within functional limits for tasks assessed   SENSATION: WFL  EDEMA:  none  MUSCLE TONE:  none  POSTURE:  No Significant postural limitations  Cervical ROM:    Active A/PROM (deg) eval  Flexion WFL dizziness  Extension WFL dizziness  Right lateral flexion  Left lateral flexion   Right rotation   Left rotation   (Blank rows = not tested)  STRENGTH: WNL  LOWER EXTREMITY MMT:   MMT Right eval Left eval  Hip flexion    Hip abduction    Hip adduction    Hip internal rotation    Hip external rotation    Knee flexion    Knee extension    Ankle dorsiflexion    Ankle plantarflexion    Ankle inversion    Ankle eversion     (Blank rows = not tested)  BED MOBILITY:  Ind  TRANSFERS: Ind  GAIT: Gait pattern: WFL and step through pattern Distance walked: 100' Assistive device utilized: None Level of assistance: Complete Independence Comments: Normal reciprocal gait  PATIENT SURVEYS:  DHI 20%  VESTIBULAR ASSESSMENT:  GENERAL OBSERVATION: WFL   SYMPTOM BEHAVIOR:  Subjective history: Pt reports feelings of having to pass out.   Non-Vestibular symptoms:  a little ear pain  Type of dizziness: Spinning/Vertigo and Lightheadedness/Faint  Frequency: Sporadic (2-3x/wk)  Duration: 5-10 min  Aggravating factors: Induced by motion: turning head quickly and activity in general and Worse in the morning  Relieving factors: closing eyes and rest  Progression of symptoms: better  OCULOMOTOR EXAM:  Ocular Alignment: normal  Ocular ROM: No Limitations  Spontaneous Nystagmus: absent  Gaze-Induced Nystagmus: absent  Smooth Pursuits: intact  Saccades: intact  FRENZEL - FIXATION SUPRESSED: Unable to assess  VESTIBULAR - OCULAR REFLEX:   Slow VOR: Normal  VOR Cancellation: Normal  Head-Impulse Test: HIT Right: negative HIT Left: negative  Dynamic Visual Acuity: Not able to be assessed   POSITIONAL TESTING: Right Dix-Hallpike: upbeating, right nystagmus,  , and Duration: <5 sec Left Dix-Hallpike: no nystagmus Right Sidelying: no nystagmus Left Sidelying: no nystagmus   MOTION SENSITIVITY:  Motion Sensitivity Quotient Intensity: 0 = none, 1 = Lightheaded, 2 = Mild, 3 = Moderate, 4 = Severe, 5 = Vomiting  Intensity  1. Sitting to supine   2. Supine to L side   3. Supine to R side   4. Supine to sitting   5. L Hallpike-Dix   6. Up from L    7. R Hallpike-Dix   8. Up from R    9. Sitting, head tipped to L knee   10. Head up from L knee   11. Sitting, head tipped to R knee   12. Head up from R knee   13. Sitting head turns x5   14.Sitting head nods x5   15. In stance, 180 turn to L    16. In  stance, 180 turn to R     OTHOSTATICS: not done  FUNCTIONAL GAIT:  did not assess   VESTIBULAR TREATMENT:                                                                                                   DATE: 01/12/23  Canalith Repositioning:  Epley Right: Number of Reps: 3 and Response to Treatment: symptoms improved   PATIENT EDUCATION: Education details: Exam findings, POC, home epley Person educated: Patient Education method:  Explanation, Demonstration, and Handouts Education comprehension: verbalized understanding, returned demonstration, and needs further education  HOME EXERCISE PROGRAM:  GOALS: Goals reviewed with patient? Yes  SHORT TERM GOALS: Target date: 02/09/2023   Pt will be ind with home canalith repositioning Baseline: Goal status: INITIAL   LONG TERM GOALS: Target date: 02/23/2023   Pt will have no s/s of BPPV in all canalith positional tests Baseline:  Goal status: INITIAL  2.  Pt will have improved DHI to </=10% to demo MCID Baseline:  Goal status: INITIAL    ASSESSMENT:  CLINICAL IMPRESSION: Patient is a 47 y.o. F who was seen today for physical therapy evaluation and treatment for dizziness. Assessment significant for R posterior canalithiasis. Provided home epley maneuver x3 with good pt response. Pt understands understanding of BPPV and will call clinic PRN for any continued needs.   OBJECTIVE IMPAIRMENTS: decreased balance and dizziness.   ACTIVITY LIMITATIONS: transfers, bed mobility, and locomotion level  PARTICIPATION LIMITATIONS: community activity and occupation  PERSONAL FACTORS: Age, Past/current experiences, and Time since onset of injury/illness/exacerbation are also affecting patient's functional outcome.   REHAB POTENTIAL: Excellent  CLINICAL DECISION MAKING: Stable/uncomplicated  EVALUATION COMPLEXITY: Low   PLAN:  PT FREQUENCY:  PRN  PT DURATION: 6 weeks  PLANNED INTERVENTIONS: Therapeutic exercises,  Therapeutic activity, Neuromuscular re-education, Balance training, Gait training, Patient/Family education, Self Care, Joint mobilization, Vestibular training, Canalith repositioning, Taping, Vasopneumatic device, Ionotophoresis 4mg /ml Dexamethasone, Manual therapy, and Re-evaluation  PLAN FOR NEXT SESSION: Assess response to Epley Maneuver. Recheck canaliths as needed.    Tiffany Calmes April Ma L Jazzma Neidhardt, PT 01/12/2023, 9:52 AM

## 2023-01-15 ENCOUNTER — Other Ambulatory Visit: Payer: Self-pay | Admitting: Family Medicine

## 2023-01-15 DIAGNOSIS — J069 Acute upper respiratory infection, unspecified: Secondary | ICD-10-CM | POA: Diagnosis not present

## 2023-01-15 MED ORDER — VITAMIN D (ERGOCALCIFEROL) 1.25 MG (50000 UNIT) PO CAPS: 50000.0000 [IU] | ORAL_CAPSULE | ORAL | 1 refills | Status: DC

## 2023-01-18 ENCOUNTER — Encounter: Payer: Self-pay | Admitting: Family Medicine

## 2023-01-21 ENCOUNTER — Ambulatory Visit: Payer: BC Managed Care – PPO | Admitting: Medical-Surgical

## 2023-01-21 ENCOUNTER — Encounter: Payer: Self-pay | Admitting: Medical-Surgical

## 2023-01-21 VITALS — BP 127/84 | HR 75 | Resp 20 | Ht 62.0 in | Wt 209.1 lb

## 2023-01-21 DIAGNOSIS — N390 Urinary tract infection, site not specified: Secondary | ICD-10-CM | POA: Diagnosis not present

## 2023-01-21 DIAGNOSIS — R82998 Other abnormal findings in urine: Secondary | ICD-10-CM

## 2023-01-21 DIAGNOSIS — R8290 Unspecified abnormal findings in urine: Secondary | ICD-10-CM | POA: Diagnosis not present

## 2023-01-21 DIAGNOSIS — R748 Abnormal levels of other serum enzymes: Secondary | ICD-10-CM | POA: Diagnosis not present

## 2023-01-21 LAB — POCT URINALYSIS DIP (CLINITEK)
Bilirubin, UA: NEGATIVE
Glucose, UA: NEGATIVE mg/dL
Ketones, POC UA: NEGATIVE mg/dL
Nitrite, UA: NEGATIVE
POC PROTEIN,UA: 30 — AB
Spec Grav, UA: 1.015 (ref 1.010–1.025)
Urobilinogen, UA: 1 E.U./dL
pH, UA: 7 (ref 5.0–8.0)

## 2023-01-21 MED ORDER — FLUCONAZOLE 150 MG PO TABS: 150.0000 mg | ORAL_TABLET | Freq: Once | ORAL | 0 refills | Status: AC

## 2023-01-21 MED ORDER — CIPROFLOXACIN HCL 500 MG PO TABS: 500.0000 mg | ORAL_TABLET | Freq: Two times a day (BID) | ORAL | 0 refills | Status: AC

## 2023-01-21 NOTE — Progress Notes (Signed)
.  0       Established patient visit  History, exam, impression, and plan:  1. Bad odor of urine 2. Dark urine Pleasant 47 year old female presenting with complaints of approximately 8 days of urinary symptoms including intermittent burning with urination, dark urine, foul urinary odor, headache, chills, and right-sided back/flank pain.  Reports subjective fevers but she is not sure if this is related to perimenopause or illness.  Has been pushing fluids at home.  Taking ibuprofen/Tylenol for headache and chills.  History notable for a bladder sling placed in February.  On exam, HRRR, S1/S2 normal.  Lungs CTA, respirations even and unlabored.  Abdomen soft, nondistended.  Tenderness at the right upper quadrant, otherwise nontender.  Bowel sounds positive x 4 quadrants.  Right CVA tenderness.  POCT urinalysis positive for moderate blood, small protein, and small leukocytes, negative for nitrites.  Sending for culture.  Checking CMP to evaluate kidney function given her recent dark urine.  Given symptoms and CVA tenderness, treating for complicated UTI with ciprofloxacin 500 mg twice daily x 5 days.  Once culture results are back, may need to switch agents.  Continue pushing fluids. - POCT URINALYSIS DIP (CLINITEK) - Urine Culture - COMPLETE METABOLIC PANEL WITH GFR  Procedures performed this visit: None.  Return if symptoms worsen or fail to improve.  __________________________________ Thayer Ohm, DNP, APRN, FNP-BC Primary Care and Sports Medicine [facility]

## 2023-01-22 LAB — COMPLETE METABOLIC PANEL WITH GFR
AG Ratio: 1.2 (calc) (ref 1.0–2.5)
ALT: 30 U/L — ABNORMAL HIGH (ref 6–29)
AST: 16 U/L (ref 10–35)
Albumin: 3.5 g/dL — ABNORMAL LOW (ref 3.6–5.1)
Alkaline phosphatase (APISO): 222 U/L — ABNORMAL HIGH (ref 31–125)
BUN: 10 mg/dL (ref 7–25)
CO2: 29 mmol/L (ref 20–32)
Calcium: 9.2 mg/dL (ref 8.6–10.2)
Chloride: 100 mmol/L (ref 98–110)
Creat: 0.58 mg/dL (ref 0.50–0.99)
Globulin: 3 g/dL (calc) (ref 1.9–3.7)
Glucose, Bld: 77 mg/dL (ref 65–99)
Potassium: 4.1 mmol/L (ref 3.5–5.3)
Sodium: 140 mmol/L (ref 135–146)
Total Bilirubin: 0.6 mg/dL (ref 0.2–1.2)
Total Protein: 6.5 g/dL (ref 6.1–8.1)
eGFR: 112 mL/min/{1.73_m2} (ref >=60)

## 2023-01-22 NOTE — Addendum Note (Signed)
Addended byChristen Butter on: 01/22/2023 10:38 AM   Modules accepted: Orders

## 2023-01-23 ENCOUNTER — Telehealth: Payer: Self-pay

## 2023-01-23 LAB — URINE CULTURE
MICRO NUMBER:: 14965900
SPECIMEN QUALITY:: ADEQUATE

## 2023-01-23 NOTE — Telephone Encounter (Addendum)
Initiated Prior authorization ZOX:WRUEAV 0.25MG /0.5ML auto-injectors Via: Covermymeds Case/Key:[id number] Status: approved  Reason:Authorization Expiration Date: May 31, 2023. as of 01/23/23 Notified Pt via: Mychart

## 2023-01-25 ENCOUNTER — Encounter: Payer: Self-pay | Admitting: Medical-Surgical

## 2023-02-06 DIAGNOSIS — R7309 Other abnormal glucose: Secondary | ICD-10-CM | POA: Diagnosis not present

## 2023-02-17 ENCOUNTER — Ambulatory Visit: Payer: BC Managed Care – PPO

## 2023-02-26 ENCOUNTER — Encounter: Payer: Self-pay | Admitting: Family Medicine

## 2023-03-05 ENCOUNTER — Telehealth: Payer: Self-pay

## 2023-03-05 NOTE — Telephone Encounter (Signed)
Initiated Prior authorization UEA:[id number] 1MG /0.5ML auto-injectors Via: Covermymeds Case/Key:[id number] Status: Pending as of 03/05/23 Reason: Notified Pt via: Mychart

## 2023-03-08 DIAGNOSIS — R7309 Other abnormal glucose: Secondary | ICD-10-CM | POA: Diagnosis not present

## 2023-03-17 ENCOUNTER — Other Ambulatory Visit: Payer: Self-pay | Admitting: Family Medicine

## 2023-03-24 DIAGNOSIS — Z1231 Encounter for screening mammogram for malignant neoplasm of breast: Secondary | ICD-10-CM

## 2023-03-26 DIAGNOSIS — N393 Stress incontinence (female) (male): Secondary | ICD-10-CM | POA: Diagnosis not present

## 2023-04-08 ENCOUNTER — Ambulatory Visit: Payer: BC Managed Care – PPO | Admitting: Family Medicine

## 2023-04-08 DIAGNOSIS — R7309 Other abnormal glucose: Secondary | ICD-10-CM | POA: Diagnosis not present

## 2023-04-20 ENCOUNTER — Other Ambulatory Visit: Payer: Self-pay | Admitting: Family Medicine

## 2023-04-20 DIAGNOSIS — Z1231 Encounter for screening mammogram for malignant neoplasm of breast: Secondary | ICD-10-CM

## 2023-04-21 ENCOUNTER — Ambulatory Visit (INDEPENDENT_AMBULATORY_CARE_PROVIDER_SITE_OTHER): Payer: BC Managed Care – PPO

## 2023-04-21 DIAGNOSIS — Z1231 Encounter for screening mammogram for malignant neoplasm of breast: Secondary | ICD-10-CM

## 2023-05-09 DIAGNOSIS — R7309 Other abnormal glucose: Secondary | ICD-10-CM | POA: Diagnosis not present

## 2023-05-19 ENCOUNTER — Encounter: Payer: Self-pay | Admitting: Family Medicine

## 2023-05-19 ENCOUNTER — Ambulatory Visit: Payer: BC Managed Care – PPO | Admitting: Family Medicine

## 2023-05-19 VITALS — BP 135/85 | HR 86 | Ht 62.0 in | Wt 196.0 lb

## 2023-05-19 DIAGNOSIS — Z23 Encounter for immunization: Secondary | ICD-10-CM | POA: Diagnosis not present

## 2023-05-19 DIAGNOSIS — E6609 Other obesity due to excess calories: Secondary | ICD-10-CM

## 2023-05-19 DIAGNOSIS — Z6836 Body mass index (BMI) 36.0-36.9, adult: Secondary | ICD-10-CM

## 2023-05-19 MED ORDER — SEMAGLUTIDE-WEIGHT MANAGEMENT 1.7 MG/0.75ML ~~LOC~~ SOAJ: 1.7000 mg | SUBCUTANEOUS | 0 refills | Status: DC

## 2023-05-19 MED ORDER — SEMAGLUTIDE-WEIGHT MANAGEMENT 2.4 MG/0.75ML ~~LOC~~ SOAJ: 2.4000 mg | SUBCUTANEOUS | 3 refills | Status: DC

## 2023-05-19 NOTE — Assessment & Plan Note (Signed)
She is doing well with Wegovy so far.  Overall she is tolerating this well.  Will plan to continue titration to 1.7mg  x1 month and then 2.4mg .  F/u in 3 months.

## 2023-05-19 NOTE — Progress Notes (Signed)
Courtney Costa - 47 y.o. female MRN [id number]  Date of birth: [birth year]  Subjective Chief Complaint  Patient presents with   Weight Loss    HPI Courtney Costa is a 47 y.o. female here today for follow up visit.   She reports that she is doing quite well.  Has had pretty good response with Wegovy so far.  Weight is down 13lbs so far.  She is having pretty good appetite suppression.  Feels more motivated to exercise and is walking more.  She has had mild constipation but really no other side effects.   ROS:  A comprehensive ROS was completed and negative except as noted per HPI   Allergies  Allergen Reactions   Vagisil [Anti-Itch Vaginal] Swelling   Benzocaine Swelling    Angioedema - can use lidocain   Meloxicam Rash    Mouth sores    Past Medical History:  Diagnosis Date   Medical history non-contributory    Spondylisthesis 05/16/2012   Spondylisthesis 05/16/2012    Past Surgical History:  Procedure Laterality Date   BREAST REDUCTION SURGERY  08/2014   COLONOSCOPY     CYSTOSCOPY N/A 10/09/2022   Procedure: CYSTOSCOPY;  Surgeon: Crist Fat, MD;  Location: [facility];  Service: Urology;  Laterality: N/A;   EYE SURGERY Bilateral    Lasik eye surgery   PUBOVAGINAL SLING N/A 10/09/2022   Procedure: MID-URETHRAL SLING;  Surgeon: Crist Fat, MD;  Location: [facility];  Service: Urology;  Laterality: N/A;   REDUCTION MAMMAPLASTY Bilateral    ROBOTIC ASSISTED LAPAROSCOPIC HYSTERECTOMY AND SALPINGECTOMY Bilateral 10/09/2022   Procedure: XI ROBOTIC ASSISTED LAPAROSCOPIC SUPRACERVICAL HYSTERECTOMY AND BILATERAL  SALPINGECTOMY;  Surgeon: Crist Fat, MD;  Location: [facility];  Service: Urology;  Laterality: Bilateral;  270 MINUTES NEEDED FOR CASE   ROBOTIC ASSISTED LAPAROSCOPIC SACROCOLPOPEXY N/A 10/09/2022   Procedure: XI ROBOTIC ASSISTED LAPAROSCOPIC SACROCOLPOPEXY;  Surgeon: Crist Fat, MD;  Location: [facility];  Service: Urology;  Laterality: N/A;     Social History   Socioeconomic History   Marital status: Married    Spouse name: Not on file   Number of children: Not on file   Years of education: Not on file   Highest education level: Bachelor's degree (e.g., BA, AB, BS)  Occupational History   Not on file  Tobacco Use   Smoking status: Never   Smokeless tobacco: Never  Vaping Use   Vaping status: Never Used  Substance and Sexual Activity   Alcohol use: No   Drug use: No   Sexual activity: Yes    Birth control/protection: None  Other Topics Concern   Not on file  Social History Narrative   Not on file   Social Determinants of Health   Financial Resource Strain: Low Risk  (01/04/2023)   Overall Financial Resource Strain (CARDIA)    Difficulty of Paying Living Expenses: Not very hard  Food Insecurity: No Food Insecurity (01/04/2023)   Hunger Vital Sign    Worried About Running Out of Food in the Last Year: Never true    Ran Out of Food in the Last Year: Never true  Transportation Needs: No Transportation Needs (01/04/2023)   PRAPARE - Administrator, Civil Service (Medical): No    Lack of Transportation (Non-Medical): No  Physical Activity: Insufficiently Active (01/04/2023)   Exercise Vital Sign    Days of Exercise per Week: 1 day    Minutes of Exercise per Session: 30 min  Stress: Stress  Concern Present (01/04/2023)   [employer] of Occupational Health - Occupational Stress Questionnaire    Feeling of Stress : To some extent  Social Connections: Unknown (05/13/2023)   Received from [facility]   Social Network    Social Network: Not on file    Family History  Problem Relation Age of Onset   Hypertension Father    Psoriasis Father    Breast cancer Neg Hx     Health Maintenance  Topic Date Due   Hepatitis C Screening  01/06/2024 (Originally 02[birth year]16/1992)   MAMMOGRAM  04/20/2024   PAP SMEAR-Modifier  10/09/2025   DTaP/Tdap/Td (3 - Td or Tdap) 08/14/2026   Colonoscopy  04/12/2031    INFLUENZA VACCINE  Completed   COVID-19 Vaccine  Completed   HIV Screening  Completed   HPV VACCINES  Aged Out     ----------------------------------------------------------------------------------------------------------------------------------------------------------------------------------------------------------------- Physical Exam BP 135/85   Pulse 86   Ht 5\' 2"  (1.575 m)   Wt 196 lb (88.9 kg)   LMP 10/09/2022 (Exact Date)   SpO2 99%   BMI 35.85 kg/m   Physical Exam Constitutional:      Appearance: Normal appearance.  Eyes:     General: No scleral icterus. Cardiovascular:     Rate and Rhythm: Normal rate and regular rhythm.     Pulses: Normal pulses.     Heart sounds: Normal heart sounds.  Musculoskeletal:     Cervical back: Neck supple.  Neurological:     General: No focal deficit present.     Mental Status: She is alert.  Psychiatric:        Mood and Affect: Mood normal.        Behavior: Behavior normal.     ------------------------------------------------------------------------------------------------------------------------------------------------------------------------------------------------------------------- Assessment and Plan  Obesity She is doing well with Wegovy so far.  Overall she is tolerating this well.  Will plan to continue titration to 1.7mg  x1 month and then 2.4mg .  F/u in 3 months.    Meds ordered this encounter  Medications   Semaglutide-Weight Management 1.7 MG/0.75ML SOAJ    Sig: Inject 1.7 mg into the skin once a week for 28 days.    Dispense:  3 mL    Refill:  0   Semaglutide-Weight Management 2.4 MG/0.75ML SOAJ    Sig: Inject 2.4 mg into the skin once a week for 28 days.    Dispense:  3 mL    Refill:  3    Return in about 3 months (around 08/18/2023) for Weight mgmt. .    This visit occurred during the SARS-CoV-2 public health emergency.  Safety protocols were in place, including screening questions prior to the visit,  additional usage of staff PPE, and extensive cleaning of exam room while observing appropriate contact time as indicated for disinfecting solutions.

## 2023-05-21 ENCOUNTER — Encounter: Payer: Self-pay | Admitting: Family Medicine

## 2023-05-21 ENCOUNTER — Ambulatory Visit (INDEPENDENT_AMBULATORY_CARE_PROVIDER_SITE_OTHER): Payer: BC Managed Care – PPO | Admitting: Family Medicine

## 2023-05-21 VITALS — BP 141/88 | HR 80

## 2023-05-21 DIAGNOSIS — I10 Essential (primary) hypertension: Secondary | ICD-10-CM

## 2023-05-21 MED ORDER — AMLODIPINE BESYLATE 5 MG PO TABS: 5.0000 mg | ORAL_TABLET | Freq: Every day | ORAL | 0 refills | Status: DC

## 2023-05-21 NOTE — Progress Notes (Signed)
Pt presents to clinic today for BP check.   2nd bp check done after pt sat for 5 minutes and this was elevated. Will advise pcp.  Pt will need to RTC in 2 weeks for nurse visit for BP check advised to bring in her bp cuff at that time and informed that Dr. Ashley Royalty will send over Amlodipine  5 mg for her to start taking.

## 2023-05-21 NOTE — Progress Notes (Signed)
Medical screening examination/treatment was performed by qualified clinical staff member and as supervising physician I was immediately available for consultation/collaboration. I have reviewed documentation and agree with assessment and plan.  Everrett Coombe, DO

## 2023-06-02 ENCOUNTER — Ambulatory Visit: Payer: BC Managed Care – PPO

## 2023-06-08 DIAGNOSIS — R7309 Other abnormal glucose: Secondary | ICD-10-CM | POA: Diagnosis not present

## 2023-06-13 ENCOUNTER — Other Ambulatory Visit: Payer: Self-pay | Admitting: Family Medicine

## 2023-07-09 DIAGNOSIS — R7309 Other abnormal glucose: Secondary | ICD-10-CM | POA: Diagnosis not present

## 2023-07-12 ENCOUNTER — Encounter: Payer: Self-pay | Admitting: Family Medicine

## 2023-07-12 MED ORDER — SEMAGLUTIDE-WEIGHT MANAGEMENT 2.4 MG/0.75ML ~~LOC~~ SOAJ: 2.4000 mg | SUBCUTANEOUS | 1 refills | Status: DC

## 2023-07-27 ENCOUNTER — Encounter: Payer: Self-pay | Admitting: Family Medicine

## 2023-07-27 NOTE — Telephone Encounter (Signed)
Started the process of the prior authorization.

## 2023-07-28 ENCOUNTER — Telehealth: Payer: Self-pay

## 2023-07-28 NOTE — Telephone Encounter (Signed)
Initiated Prior authorization ZOX:[id number] 2.4 MG INJECTORS  Via: Covermymeds Case/Key:[id number] Status: approved as of 07/28/23 Reason:approved through 07/26/24 Notified Pt via: Mychart

## 2023-08-08 DIAGNOSIS — R7309 Other abnormal glucose: Secondary | ICD-10-CM | POA: Diagnosis not present

## 2023-08-18 ENCOUNTER — Encounter: Payer: Self-pay | Admitting: Family Medicine

## 2023-08-18 ENCOUNTER — Ambulatory Visit (INDEPENDENT_AMBULATORY_CARE_PROVIDER_SITE_OTHER): Payer: BC Managed Care – PPO | Admitting: Family Medicine

## 2023-08-18 VITALS — BP 123/86 | HR 75 | Ht 62.0 in | Wt 182.0 lb

## 2023-08-18 DIAGNOSIS — E6609 Other obesity due to excess calories: Secondary | ICD-10-CM | POA: Diagnosis not present

## 2023-08-18 DIAGNOSIS — E66812 Obesity, class 2: Secondary | ICD-10-CM

## 2023-08-18 DIAGNOSIS — Z6836 Body mass index (BMI) 36.0-36.9, adult: Secondary | ICD-10-CM

## 2023-08-18 MED ORDER — SEMAGLUTIDE-WEIGHT MANAGEMENT 2.4 MG/0.75ML ~~LOC~~ SOAJ: 2.4000 mg | SUBCUTANEOUS | 3 refills | Status: DC

## 2023-08-18 NOTE — Progress Notes (Signed)
Courtney Costa - 47 y.o. female MRN [id number]  Date of birth: 09/29/1975  Subjective Chief Complaint  Patient presents with   Weight Loss    HPI Courtney Costa is a 47 y.o. female here today for follow up of weight management.  She is currently taking Wegovy 2.4mg /week.  She is tolerating this quite well.  Starting weight was 209-->182lbs today.  She would like to continue with this. She continues to work on portion control and walking more.   ROS:  A comprehensive ROS was completed and negative except as noted per HPI  Allergies  Allergen Reactions   Vagisil [Anti-Itch Vaginal] Swelling   Benzocaine Swelling    Angioedema - can use lidocain   Meloxicam Rash    Mouth sores    Past Medical History:  Diagnosis Date   Medical history non-contributory    Spondylisthesis 05/16/2012   Spondylisthesis 05/16/2012    Past Surgical History:  Procedure Laterality Date   BREAST REDUCTION SURGERY  08/2014   COLONOSCOPY     CYSTOSCOPY N/A 10/09/2022   Procedure: CYSTOSCOPY;  Surgeon: Crist Fat, MD;  Location: [facility];  Service: Urology;  Laterality: N/A;   EYE SURGERY Bilateral    Lasik eye surgery   PUBOVAGINAL SLING N/A 10/09/2022   Procedure: MID-URETHRAL SLING;  Surgeon: Crist Fat, MD;  Location: [facility];  Service: Urology;  Laterality: N/A;   REDUCTION MAMMAPLASTY Bilateral    ROBOTIC ASSISTED LAPAROSCOPIC HYSTERECTOMY AND SALPINGECTOMY Bilateral 10/09/2022   Procedure: XI ROBOTIC ASSISTED LAPAROSCOPIC SUPRACERVICAL HYSTERECTOMY AND BILATERAL  SALPINGECTOMY;  Surgeon: Crist Fat, MD;  Location: [facility];  Service: Urology;  Laterality: Bilateral;  270 MINUTES NEEDED FOR CASE   ROBOTIC ASSISTED LAPAROSCOPIC SACROCOLPOPEXY N/A 10/09/2022   Procedure: XI ROBOTIC ASSISTED LAPAROSCOPIC SACROCOLPOPEXY;  Surgeon: Crist Fat, MD;  Location: [facility];  Service: Urology;  Laterality: N/A;    Social History   Socioeconomic History   Marital  status: Married    Spouse name: Not on file   Number of children: Not on file   Years of education: Not on file   Highest education level: Bachelor's degree (e.g., BA, AB, BS)  Occupational History   Not on file  Tobacco Use   Smoking status: Never   Smokeless tobacco: Never  Vaping Use   Vaping status: Never Used  Substance and Sexual Activity   Alcohol use: No   Drug use: No   Sexual activity: Yes    Birth control/protection: None  Other Topics Concern   Not on file  Social History Narrative   Not on file   Social Determinants of Health   Financial Resource Strain: Low Risk  (01/04/2023)   Overall Financial Resource Strain (CARDIA)    Difficulty of Paying Living Expenses: Not very hard  Food Insecurity: No Food Insecurity (01/04/2023)   Hunger Vital Sign    Worried About Running Out of Food in the Last Year: Never true    Ran Out of Food in the Last Year: Never true  Transportation Needs: No Transportation Needs (01/04/2023)   PRAPARE - Administrator, Civil Service (Medical): No    Lack of Transportation (Non-Medical): No  Physical Activity: Insufficiently Active (01/04/2023)   Exercise Vital Sign    Days of Exercise per Week: 1 day    Minutes of Exercise per Session: 30 min  Stress: Stress Concern Present (01/04/2023)   [employer] of Occupational Health - Occupational Stress Questionnaire    Feeling  of Stress : To some extent  Social Connections: Unknown (05/13/2023)   Received from [facility]   Social Network    Social Network: Not on file    Family History  Problem Relation Age of Onset   Hypertension Father    Psoriasis Father    Breast cancer Neg Hx     Health Maintenance  Topic Date Due   Hepatitis C Screening  01/06/2024 (Originally 02/16/1992)   MAMMOGRAM  04/20/2024   Cervical Cancer Screening (HPV/Pap Cotest)  07/31/2024   DTaP/Tdap/Td (3 - Td or Tdap) 08/14/2026   Colonoscopy  04/12/2031   INFLUENZA VACCINE  Completed    COVID-19 Vaccine  Completed   HIV Screening  Completed   HPV VACCINES  Aged Out     ----------------------------------------------------------------------------------------------------------------------------------------------------------------------------------------------------------------- Physical Exam BP 123/86 (BP Location: Left Arm, Patient Position: Sitting, Cuff Size: Large)   Pulse 75   Ht 5\' 2"  (1.575 m)   Wt 182 lb (82.6 kg)   LMP 10/09/2022 (Exact Date)   SpO2 99%   BMI 33.29 kg/m   Physical Exam Constitutional:      Appearance: Normal appearance.  Eyes:     General: No scleral icterus. Cardiovascular:     Rate and Rhythm: Normal rate and regular rhythm.  Pulmonary:     Effort: Pulmonary effort is normal.     Breath sounds: Normal breath sounds.  Musculoskeletal:     Cervical back: Neck supple.  Neurological:     Mental Status: She is alert.  Psychiatric:        Mood and Affect: Mood normal.        Behavior: Behavior normal.     ------------------------------------------------------------------------------------------------------------------------------------------------------------------------------------------------------------------- Assessment and Plan  Obesity She is doing quite well with Wegovy 2.4mg .  Will plan to continue and follow up in 4-6 months.    Meds ordered this encounter  Medications   Semaglutide-Weight Management 2.4 MG/0.75ML SOAJ    Sig: Inject 2.4 mg into the skin once a week.    Dispense:  3 mL    Refill:  3    Return in about 4 months (around 12/17/2023) for Weight mgmt. .    This visit occurred during the SARS-CoV-2 public health emergency.  Safety protocols were in place, including screening questions prior to the visit, additional usage of staff PPE, and extensive cleaning of exam room while observing appropriate contact time as indicated for disinfecting solutions.

## 2023-08-18 NOTE — Assessment & Plan Note (Signed)
She is doing quite well with Wegovy 2.4mg .  Will plan to continue and follow up in 4-6 months.

## 2023-09-08 DIAGNOSIS — R7309 Other abnormal glucose: Secondary | ICD-10-CM | POA: Diagnosis not present

## 2023-09-29 ENCOUNTER — Ambulatory Visit: Payer: BC Managed Care – PPO | Admitting: Family Medicine

## 2023-09-29 ENCOUNTER — Encounter: Payer: Self-pay | Admitting: Family Medicine

## 2023-09-29 VITALS — BP 133/79 | HR 76 | Ht 62.0 in | Wt 176.0 lb

## 2023-09-29 DIAGNOSIS — M2550 Pain in unspecified joint: Secondary | ICD-10-CM | POA: Insufficient documentation

## 2023-09-29 MED ORDER — NAPROXEN 500 MG PO TABS: 500.0000 mg | ORAL_TABLET | Freq: Two times a day (BID) | ORAL | 0 refills | Status: AC

## 2023-09-29 NOTE — Progress Notes (Signed)
Courtney Costa - 48 y.o. female MRN [id number]  Date of birth: [birth year]  Subjective Chief Complaint  Patient presents with   Shoulder Pain   Wrist Pain    HPI Courtney Costa is a 48 y.o. female here today with complaint of bilateral wrist and shoulder pain.  This is worsened by positioning at work. She does have carpal tunnel brace that she uses at night and these help.  She does feel that posture makes symptoms worse.  She does have a small variable height desk but doesn't have enough room for paperwork and her laptop. She denies weakness in grip strength.  Her shoulder do feel weak due to pain.   ROS:  A comprehensive ROS was completed and negative except as noted per HPI  Allergies  Allergen Reactions   Vagisil [Anti-Itch Vaginal] Swelling   Benzocaine Swelling    Angioedema - can use lidocain   Meloxicam Rash    Mouth sores    Past Medical History:  Diagnosis Date   Medical history non-contributory    Spondylisthesis 05/16/2012   Spondylisthesis 05/16/2012    Past Surgical History:  Procedure Laterality Date   BREAST REDUCTION SURGERY  08/2014   COLONOSCOPY     CYSTOSCOPY N/A 10/09/2022   Procedure: CYSTOSCOPY;  Surgeon: Crist Fat, MD;  Location: [facility];  Service: Urology;  Laterality: N/A;   EYE SURGERY Bilateral    Lasik eye surgery   PUBOVAGINAL SLING N/A 10/09/2022   Procedure: MID-URETHRAL SLING;  Surgeon: Crist Fat, MD;  Location: [facility];  Service: Urology;  Laterality: N/A;   REDUCTION MAMMAPLASTY Bilateral    ROBOTIC ASSISTED LAPAROSCOPIC HYSTERECTOMY AND SALPINGECTOMY Bilateral 10/09/2022   Procedure: XI ROBOTIC ASSISTED LAPAROSCOPIC SUPRACERVICAL HYSTERECTOMY AND BILATERAL  SALPINGECTOMY;  Surgeon: Crist Fat, MD;  Location: [facility];  Service: Urology;  Laterality: Bilateral;  270 MINUTES NEEDED FOR CASE   ROBOTIC ASSISTED LAPAROSCOPIC SACROCOLPOPEXY N/A 10/09/2022   Procedure: XI ROBOTIC ASSISTED LAPAROSCOPIC  SACROCOLPOPEXY;  Surgeon: Crist Fat, MD;  Location: [facility];  Service: Urology;  Laterality: N/A;    Social History   Socioeconomic History   Marital status: Married    Spouse name: Not on file   Number of children: Not on file   Years of education: Not on file   Highest education level: Bachelor's degree (e.g., BA, AB, BS)  Occupational History   Not on file  Tobacco Use   Smoking status: Never   Smokeless tobacco: Never  Vaping Use   Vaping status: Never Used  Substance and Sexual Activity   Alcohol use: No   Drug use: No   Sexual activity: Yes    Birth control/protection: None  Other Topics Concern   Not on file  Social History Narrative   Not on file   Social Drivers of Health   Financial Resource Strain: Low Risk  (01/04/2023)   Overall Financial Resource Strain (CARDIA)    Difficulty of Paying Living Expenses: Not very hard  Food Insecurity: No Food Insecurity (01/04/2023)   Hunger Vital Sign    Worried About Running Out of Food in the Last Year: Never true    Ran Out of Food in the Last Year: Never true  Transportation Needs: No Transportation Needs (01/04/2023)   PRAPARE - Administrator, Civil Service (Medical): No    Lack of Transportation (Non-Medical): No  Physical Activity: Insufficiently Active (01/04/2023)   Exercise Vital Sign    Days of Exercise per Week:  1 day    Minutes of Exercise per Session: 30 min  Stress: Stress Concern Present (01/04/2023)   [employer] of Occupational Health - Occupational Stress Questionnaire    Feeling of Stress : To some extent  Social Connections: Unknown (05/13/2023)   Received from [facility]   Social Network    Social Network: Not on file    Family History  Problem Relation Age of Onset   Hypertension Father    Psoriasis Father    Breast cancer Neg Hx     Health Maintenance  Topic Date Due   Hepatitis C Screening  01/06/2024 (Originally 02/16/1992)   MAMMOGRAM  04/20/2024   Cervical  Cancer Screening (HPV/Pap Cotest)  07/31/2024   DTaP/Tdap/Td (3 - Td or Tdap) 08/14/2026   Colonoscopy  04/12/2031   INFLUENZA VACCINE  Completed   COVID-19 Vaccine  Completed   HIV Screening  Completed   HPV VACCINES  Aged Out     ----------------------------------------------------------------------------------------------------------------------------------------------------------------------------------------------------------------- Physical Exam BP 133/79 (BP Location: Left Arm, Patient Position: Sitting, Cuff Size: Normal)   Pulse 76   Ht 5\' 2"  (1.575 m)   Wt 176 lb (79.8 kg)   LMP 10/09/2022 (Exact Date)   SpO2 99%   BMI 32.19 kg/m   Physical Exam Constitutional:      Appearance: Normal appearance.  Cardiovascular:     Rate and Rhythm: Normal rate and regular rhythm.  Neurological:     Mental Status: She is alert.  Psychiatric:        Mood and Affect: Mood normal.        Behavior: Behavior normal.     ------------------------------------------------------------------------------------------------------------------------------------------------------------------------------------------------------------------- Assessment and Plan  Polyarthralgia Possibly related to ergonomics and poor posture. Letter written for larger varidesk.  Adding naproxen as needed.  Checking autoimmune markers given multiple joint that are involved.    Meds ordered this encounter  Medications   naproxen (NAPROSYN) 500 MG tablet    Sig: Take 1 tablet (500 mg total) by mouth 2 (two) times daily with a meal.    Dispense:  60 tablet    Refill:  0    No follow-ups on file.    This visit occurred during the SARS-CoV-2 public health emergency.  Safety protocols were in place, including screening questions prior to the visit, additional usage of staff PPE, and extensive cleaning of exam room while observing appropriate contact time as indicated for disinfecting solutions.

## 2023-09-29 NOTE — Assessment & Plan Note (Signed)
Possibly related to ergonomics and poor posture. Letter written for larger varidesk.  Adding naproxen as needed.  Checking autoimmune markers given multiple joint that are involved.

## 2023-09-29 NOTE — Patient Instructions (Addendum)
Try naproxen twice per day as needed.   Do home exercises

## 2023-10-02 LAB — SEDIMENTATION RATE: Sed Rate: 5 mm/h (ref 0–32)

## 2023-10-02 LAB — ANA,IFA RA DIAG PNL W/RFLX TIT/PATN
ANA Titer 1: NEGATIVE
Cyclic Citrullin Peptide Ab: 2 U (ref 0–19)
Rheumatoid fact SerPl-aCnc: <10 [IU]/mL (ref <14.0)

## 2023-10-02 LAB — C-REACTIVE PROTEIN: CRP: 3 mg/L (ref 0–10)

## 2023-10-08 ENCOUNTER — Encounter: Payer: Self-pay | Admitting: Family Medicine

## 2023-10-09 DIAGNOSIS — R7309 Other abnormal glucose: Secondary | ICD-10-CM | POA: Diagnosis not present

## 2023-11-06 DIAGNOSIS — R7309 Other abnormal glucose: Secondary | ICD-10-CM | POA: Diagnosis not present

## 2023-12-07 DIAGNOSIS — R7309 Other abnormal glucose: Secondary | ICD-10-CM | POA: Diagnosis not present

## 2023-12-17 ENCOUNTER — Ambulatory Visit: Payer: BC Managed Care – PPO | Admitting: Family Medicine

## 2023-12-21 ENCOUNTER — Ambulatory Visit: Admitting: Family Medicine

## 2023-12-21 ENCOUNTER — Encounter: Payer: Self-pay | Admitting: Family Medicine

## 2023-12-21 VITALS — BP 135/85 | HR 74 | Ht 62.0 in | Wt 168.0 lb

## 2023-12-21 DIAGNOSIS — E66811 Obesity, class 1: Secondary | ICD-10-CM

## 2023-12-21 DIAGNOSIS — Z6830 Body mass index (BMI) 30.0-30.9, adult: Secondary | ICD-10-CM

## 2023-12-21 DIAGNOSIS — E6609 Other obesity due to excess calories: Secondary | ICD-10-CM | POA: Diagnosis not present

## 2023-12-21 MED ORDER — SEMAGLUTIDE-WEIGHT MANAGEMENT 2.4 MG/0.75ML ~~LOC~~ SOAJ: 2.4000 mg | SUBCUTANEOUS | 3 refills | Status: DC

## 2023-12-21 NOTE — Assessment & Plan Note (Signed)
 She is doing quite well with Wegovy 2.4mg .  Will plan to continue and follow up in 4-6 months.

## 2023-12-21 NOTE — Progress Notes (Signed)
 Courtney Costa - 48 y.o. female MRN [id number]  Date of birth: 24-Feb-1976  Subjective Chief Complaint  Patient presents with   Weight Loss    HPI Courtney Costa is a 48 y.o. female here today for follow up visit.   She reports that she is doing well.  Weight is down an additional 8 lbs since last visit.  Down 42 lbs (20%) since 1 year ago.  She is having minimal side effect at this time.  She is exercising a few days per week.  Overall eating healthier.   ROS:  A comprehensive ROS was completed and negative except as noted per HPI  Allergies  Allergen Reactions   Vagisil [Anti-Itch Vaginal] Swelling   Benzocaine Swelling    Angioedema - can use lidocain   Meloxicam Rash    Mouth sores    Past Medical History:  Diagnosis Date   Medical history non-contributory    Spondylisthesis 05/16/2012   Spondylisthesis 05/16/2012    Past Surgical History:  Procedure Laterality Date   BREAST REDUCTION SURGERY  08/2014   COLONOSCOPY     CYSTOSCOPY N/A 10/09/2022   Procedure: CYSTOSCOPY;  Surgeon: Crist Fat, MD;  Location: [facility];  Service: Urology;  Laterality: N/A;   EYE SURGERY Bilateral    Lasik eye surgery   PUBOVAGINAL SLING N/A 10/09/2022   Procedure: MID-URETHRAL SLING;  Surgeon: Crist Fat, MD;  Location: [facility];  Service: Urology;  Laterality: N/A;   REDUCTION MAMMAPLASTY Bilateral    ROBOTIC ASSISTED LAPAROSCOPIC HYSTERECTOMY AND SALPINGECTOMY Bilateral 10/09/2022   Procedure: XI ROBOTIC ASSISTED LAPAROSCOPIC SUPRACERVICAL HYSTERECTOMY AND BILATERAL  SALPINGECTOMY;  Surgeon: Crist Fat, MD;  Location: [facility];  Service: Urology;  Laterality: Bilateral;  270 MINUTES NEEDED FOR CASE   ROBOTIC ASSISTED LAPAROSCOPIC SACROCOLPOPEXY N/A 10/09/2022   Procedure: XI ROBOTIC ASSISTED LAPAROSCOPIC SACROCOLPOPEXY;  Surgeon: Crist Fat, MD;  Location: [facility];  Service: Urology;  Laterality: N/A;    Social History   Socioeconomic History    Marital status: Married    Spouse name: Not on file   Number of children: Not on file   Years of education: Not on file   Highest education level: Bachelor's degree (e.g., BA, AB, BS)  Occupational History   Not on file  Tobacco Use   Smoking status: Never   Smokeless tobacco: Never  Vaping Use   Vaping status: Never Used  Substance and Sexual Activity   Alcohol use: No   Drug use: No   Sexual activity: Yes    Birth control/protection: None  Other Topics Concern   Not on file  Social History Narrative   Not on file   Social Drivers of Health   Financial Resource Strain: Low Risk  (01/04/2023)   Overall Financial Resource Strain (CARDIA)    Difficulty of Paying Living Expenses: Not very hard  Food Insecurity: No Food Insecurity (01/04/2023)   Hunger Vital Sign    Worried About Running Out of Food in the Last Year: Never true    Ran Out of Food in the Last Year: Never true  Transportation Needs: No Transportation Needs (01/04/2023)   PRAPARE - Administrator, Civil Service (Medical): No    Lack of Transportation (Non-Medical): No  Physical Activity: Insufficiently Active (01/04/2023)   Exercise Vital Sign    Days of Exercise per Week: 1 day    Minutes of Exercise per Session: 30 min  Stress: Stress Concern Present (01/04/2023)   [place]  [employer] - Occupational Stress Questionnaire    Feeling of Stress : To some extent  Social Connections: Unknown (05/13/2023)   [facility] from [facility]   Social Network    Social Network: Not on file    Family History  Problem Relation Age of Onset   Hypertension Father    Psoriasis Father    Breast cancer Neg Hx     Health Maintenance  Topic Date Due   Hepatitis C Screening  01/06/2024 (Originally 02/16/1994)   INFLUENZA VACCINE  04/07/2024   MAMMOGRAM  04/20/2024   Cervical Cancer Screening (HPV/Pap Cotest)  07/31/2024   DTaP/Tdap/Td (3 - Td or Tdap) 08/14/2026   Colonoscopy  04/12/2031    COVID-19 Vaccine  Completed   HIV Screening  Completed   HPV VACCINES  Aged Out   Meningococcal B Vaccine  Aged Out     ----------------------------------------------------------------------------------------------------------------------------------------------------------------------------------------------------------------- Physical Exam BP 135/85 (BP Location: Left Arm, Patient Position: Sitting, Cuff Size: Normal)   Pulse 74   Ht 5\' 2"  (1.575 m)   Wt 168 lb (76.2 kg)   LMP 10/09/2022 (Exact Date)   SpO2 100%   BMI 30.73 kg/m   Physical Exam Constitutional:      Appearance: Normal appearance.  Eyes:     General: No scleral icterus. Cardiovascular:     Rate and Rhythm: Normal rate and regular rhythm.  Pulmonary:     Effort: Pulmonary effort is normal.     Breath sounds: Normal breath sounds.  Neurological:     Mental Status: She is alert.  Psychiatric:        Mood and Affect: Mood normal.        Behavior: Behavior normal.     ------------------------------------------------------------------------------------------------------------------------------------------------------------------------------------------------------------------- Assessment and Plan  Obesity She is doing quite well with Wegovy 2.4mg .  Will plan to continue and follow up in 4-6 months.    Meds ordered this encounter  Medications   Semaglutide-Weight Management 2.4 MG/0.75ML SOAJ    Sig: Inject 2.4 mg into the skin once a week.    Dispense:  3 mL    Refill:  3    Return in about 4 months (around 04/21/2024) for weight management.

## 2024-01-06 DIAGNOSIS — R7309 Other abnormal glucose: Secondary | ICD-10-CM | POA: Diagnosis not present

## 2024-02-04 ENCOUNTER — Encounter: Payer: Self-pay | Admitting: Family Medicine

## 2024-02-06 DIAGNOSIS — R7309 Other abnormal glucose: Secondary | ICD-10-CM | POA: Diagnosis not present

## 2024-02-14 ENCOUNTER — Ambulatory Visit: Admitting: Obstetrics & Gynecology

## 2024-02-14 ENCOUNTER — Encounter: Payer: Self-pay | Admitting: Obstetrics & Gynecology

## 2024-02-14 ENCOUNTER — Other Ambulatory Visit (HOSPITAL_COMMUNITY)
Admission: RE | Admit: 2024-02-14 | Discharge: 2024-02-14 | Disposition: A | Discharge status: Home / Self Care | Source: Ambulatory Visit | Attending: Obstetrics & Gynecology | Admitting: Obstetrics & Gynecology

## 2024-02-14 VITALS — BP 136/87 | HR 84 | Ht 61.0 in | Wt 167.0 lb

## 2024-02-14 DIAGNOSIS — Z1331 Encounter for screening for depression: Secondary | ICD-10-CM | POA: Diagnosis not present

## 2024-02-14 DIAGNOSIS — Z01419 Encounter for gynecological examination (general) (routine) without abnormal findings: Secondary | ICD-10-CM

## 2024-02-14 DIAGNOSIS — N644 Mastodynia: Secondary | ICD-10-CM

## 2024-02-14 DIAGNOSIS — N951 Menopausal and female climacteric states: Secondary | ICD-10-CM | POA: Diagnosis not present

## 2024-02-14 MED ORDER — ESTRADIOL 0.025 MG/24HR TD PTTW: 1.0000 | MEDICATED_PATCH | TRANSDERMAL | 12 refills | Status: DC

## 2024-02-14 NOTE — Progress Notes (Signed)
 Subjective:     Courtney Costa is a 48 y.o. female here for a routine exam.  Current complaints: left shoulder pain, anxiety, hot flashes, vaginal dryness, decreased libido.  Lost 60 pounds on Wegovy  over past year.     Gynecologic History Patient's last menstrual period was 10/09/2022 (exact date). Contraception: status post hysterectomy Last pap smear (date and result):08/01/2019 WNL  s/p Hysterectomy, Sacrocolpopexy, Pubovaginal Sling, Cystoscopy 10/09/2022 Still has cervix Last mammogram (date and result):04/21/2023 Negative Last colon screening (date and result): Pt reported 2023 with normal findings.  Brush:yes Floss:yes Seatbelts: yes Sunscreen: yes  Obstetric History OB History  Gravida Para Term Preterm AB Living  3 3 3   3   SAB IAB Ectopic Multiple Live Births     0 3    # Outcome Date GA Lbr Len/2nd Weight Sex Type Anes PTL Lv  3 Term 10/30/16 [redacted]w[redacted]d 00:42 / 00:13 6 lb 9 oz (2.977 kg) F Vag-Spont EPI  LIV     Birth Comments: Trisomy 21  2 Term 12/29/06   8 lb 4 oz (3.742 kg) M Vag-Spont     1 Term 2004   8 lb 4 oz (3.742 kg) M Vag-Spont      Procedure: Robotic-assisted laparoscopic supracervical hysterectomy and  bilateral salpingectomy (bilateral ovarian sparing) Robotic-assisted laparoscopic sacrocolpopexy Mid-urethral sling cystoscopy  The following portions of the patient's history were reviewed and updated as appropriate: allergies, current medications, past family history, past medical history, past social history, past surgical history, and problem list.  Review of Systems Pertinent items noted in HPI and remainder of comprehensive ROS otherwise negative.    Objective:     Vitals:   02/14/24 1331  BP: 136/87  Pulse: 84  Weight: 167 lb (75.8 kg)  Height: 5\' 1"  (1.549 m)   Vitals:  WNL General appearance: alert, cooperative and no distress  HEENT: Normocephalic, without obvious abnormality, atraumatic Eyes: negative Throat: lips,  mucosa, and tongue normal; teeth and gums normal  Respiratory: Clear to auscultation bilaterally  CV: Regular rate and rhythm  Breasts:  Normal appearance, no masses or tenderness, no nipple retraction or dimpling  GI: Soft, non-tender; bowel sounds normal; no masses,  no organomegaly  GU: External Genitalia:  Tanner V, no lesion Urethra:  No prolapse   Vagina: Pink, normal rugae, no blood or discharge  Cervix: No CMT, no lesion  Uterus:  Uterus surgically absent  Adnexa: Normal, no masses, non tender  Musculoskeletal: No edema, redness or tenderness in the calves or thighs  Skin: No lesions or rash  Lymphatic: Axillary adenopathy: none     Psychiatric: Normal mood and behavior   Vitals:   02/14/24 1331  BP: 136/87  Pulse: 84  Weight: 167 lb (75.8 kg)  Height: 5\' 1"  (1.549 m)        Assessment:    Healthy female exam.  Menopausal symptoms   Plan:   Pap with cotesting (has cervix) Left diagnostic mammogram--pain and thickening at 3 o'clock (?scar tissue from reduction).   Menopausal symptoms--we discussed hormone replacement therapy, vaginal estrogen, nonhormonal treatment for hot flashes, physical therapy for shoulder issues.  With joint decision-making we decided to try hormone replacement therapy to tackle all of her complaints.  Patient has had a supracervical hysterectomy so does not need progesterone .  We will start off with Vivelle  patch 0.025 twice a week.  We can add in more vaginal estrogen if symptoms are not controlled vaginally.  Patient has not had a  stroke or DVT.  Patient aware of risk of estrogen therapy.  We will stay on the lowest dose of estrogen for the shortest amount of time to manage her symptoms.

## 2024-02-14 NOTE — Patient Instructions (Signed)
   Moisturizers  They are used in the vagina to hydrate the mucous membrane that make up the vaginal canal.  Designed to keep a more normal acid balance (ph)  Once placed in the vagina, it will last between two to three days.  Use 2-3 times per week at bedtime  Ingredients to avoid is glycerin  and fragrance, can increase chance of infection  Should not be used just before sex due to causing irritation  Most are gels administered either in a tampon-shaped applicator or as a vaginal suppository. They are non-hormonal.  Best to massage the moisturizers into the vaginal canal and vulva area   Types of Moisturizers  Vitamin E vaginal suppositories- Whole foods, Amazon  Moist Again  Coconut oil- can break down condoms  Julva- (Do no use if on Tamoxifen) amazon  Yes moisturizer- amazon  NeuEve Silk , NeuEve Silver for menopausal or over 65 (if have severe vaginal atrophy or cancer treatments use NeuEve Silk for 1 month than move to [employer])- [employer], [place].com  Olive and Bee intimate cream- www.oliveandbee.com.au  Mae vaginal moisturizer- Amazon  Aloe  Bonafide  Hyaloronic Acid suppositories   Creams to use externally on the Vulva area daily  Marathon Oil (good for for cancer patients that had radiation to the area)- [place] or [employer].https://garcia-valdez.org/  V-magic cream - amazon  Julva-amazon  Vital "V Wild Yam salve ( help moisturize and help with thinning vulvar area, does have Beeswax  MoodMaid Botanical Pro-Meno Wild Yam Cream- Amazon  Desert Harvest Gele  Cleo by Derenda Flax labial moisturizer (Amazon,  Coconut or olive oil  Aloe  Yes  Good Clean Love  Enchanted rose    Things to avoid in the vaginal area  Do not use things to irritate the vulvar area    No lotions just specialized creams for the vulva area- Neogyn, V-magic, No soaps; can use Aveeno or Calendula cleanser if needed. Must be gentle  No deodorants  No douches  Good to sleep without underwear to let the  vaginal area to air out  No scrubbing: spread the lips to let warm water  rinse over labias and pat dry

## 2024-02-15 LAB — CYTOLOGY - PAP
Comment: NEGATIVE
Diagnosis: NEGATIVE
High risk HPV: NEGATIVE

## 2024-02-20 ENCOUNTER — Ambulatory Visit: Payer: Self-pay | Admitting: Obstetrics & Gynecology

## 2024-03-07 DIAGNOSIS — R7309 Other abnormal glucose: Secondary | ICD-10-CM | POA: Diagnosis not present

## 2024-03-08 ENCOUNTER — Other Ambulatory Visit: Payer: Self-pay | Admitting: Obstetrics & Gynecology

## 2024-03-13 ENCOUNTER — Encounter: Payer: Self-pay | Admitting: Obstetrics & Gynecology

## 2024-03-14 ENCOUNTER — Other Ambulatory Visit: Payer: Self-pay | Admitting: Obstetrics & Gynecology

## 2024-03-14 MED ORDER — ESTRADIOL 0.05 MG/24HR TD PTTW: 1.0000 | MEDICATED_PATCH | TRANSDERMAL | 12 refills | Status: DC

## 2024-03-14 NOTE — Progress Notes (Signed)
 Pt not getting relief with 0.025 dose after 1 month.  Increased to 0.05 (Vivelle  dot)  Rx sent to [facility].

## 2024-03-30 ENCOUNTER — Other Ambulatory Visit: Payer: Self-pay | Admitting: Obstetrics & Gynecology

## 2024-03-30 DIAGNOSIS — N644 Mastodynia: Secondary | ICD-10-CM

## 2024-03-31 ENCOUNTER — Other Ambulatory Visit: Payer: Self-pay | Admitting: Family Medicine

## 2024-04-04 ENCOUNTER — Other Ambulatory Visit: Payer: Self-pay | Admitting: Obstetrics & Gynecology

## 2024-04-07 DIAGNOSIS — R7309 Other abnormal glucose: Secondary | ICD-10-CM | POA: Diagnosis not present

## 2024-04-19 ENCOUNTER — Ambulatory Visit: Admitting: Family Medicine

## 2024-04-19 ENCOUNTER — Encounter: Payer: Self-pay | Admitting: Family Medicine

## 2024-04-19 VITALS — BP 118/80 | HR 74 | Resp 20 | Ht 61.0 in | Wt 168.0 lb

## 2024-04-19 DIAGNOSIS — E6609 Other obesity due to excess calories: Secondary | ICD-10-CM

## 2024-04-19 DIAGNOSIS — Z6830 Body mass index (BMI) 30.0-30.9, adult: Secondary | ICD-10-CM

## 2024-04-19 DIAGNOSIS — E66811 Obesity, class 1: Secondary | ICD-10-CM | POA: Diagnosis not present

## 2024-04-19 NOTE — Assessment & Plan Note (Signed)
 She is doing quite well with Wegovy  2.4mg  as far as appetite control.  Discussed incorporating some exercise/strength training to help with weight loss.

## 2024-04-19 NOTE — Progress Notes (Signed)
 Courtney Costa - 48 y.o. female MRN [id number]  Date of birth: 02/05/1976  Subjective Chief Complaint  Patient presents with   Weight Check    Feels at a plato, has not lost weight since april    HPI Courtney Costa is a 48 y.o. female here today for follow up of weight management.  She has continued on Wegovy  but has not really lost any additional weight since last visit.  She really has not been exercising but has been able to control diet pretty well.  She questions whether stopping Wegovy  for a period of time may be beneficial.  She is also working with her Gyn regarding hormones since her hysterectomy.   ROS:  A comprehensive ROS was completed and negative except as noted per HPI  Allergies  Allergen Reactions   Vagisil [Anti-Itch Vaginal] Swelling   Benzocaine  Swelling    Angioedema - can use lidocain   Meloxicam  Rash    Mouth sores    Past Medical History:  Diagnosis Date   Medical history non-contributory    Spondylisthesis 05/16/2012   Spondylisthesis 05/16/2012    Past Surgical History:  Procedure Laterality Date   BREAST REDUCTION SURGERY  08/2014   COLONOSCOPY     CYSTOSCOPY N/A 10/09/2022   Procedure: CYSTOSCOPY;  Surgeon: Cam Morene ORN, MD;  Location: [facility];  Service: Urology;  Laterality: N/A;   EYE SURGERY Bilateral    Lasik eye surgery   PUBOVAGINAL SLING N/A 10/09/2022   Procedure: MID-URETHRAL SLING;  Surgeon: Cam Morene ORN, MD;  Location: [facility];  Service: Urology;  Laterality: N/A;   REDUCTION MAMMAPLASTY Bilateral    ROBOTIC ASSISTED LAPAROSCOPIC HYSTERECTOMY AND SALPINGECTOMY Bilateral 10/09/2022   Procedure: XI ROBOTIC ASSISTED LAPAROSCOPIC SUPRACERVICAL HYSTERECTOMY AND BILATERAL  SALPINGECTOMY;  Surgeon: Cam Morene ORN, MD;  Location: [facility];  Service: Urology;  Laterality: Bilateral;  270 MINUTES NEEDED FOR CASE   ROBOTIC ASSISTED LAPAROSCOPIC SACROCOLPOPEXY N/A 10/09/2022   Procedure: XI ROBOTIC ASSISTED LAPAROSCOPIC  SACROCOLPOPEXY;  Surgeon: Cam Morene ORN, MD;  Location: [facility];  Service: Urology;  Laterality: N/A;    Social History   Socioeconomic History   Marital status: Married    Spouse name: Not on file   Number of children: Not on file   Years of education: Not on file   Highest education level: Bachelor's degree (e.g., BA, AB, BS)  Occupational History   Not on file  Tobacco Use   Smoking status: Never   Smokeless tobacco: Never  Vaping Use   Vaping status: Never Used  Substance and Sexual Activity   Alcohol use: No   Drug use: No   Sexual activity: Yes    Birth control/protection: None  Other Topics Concern   Not on file  Social History Narrative   Not on file   Social Drivers of Health   Financial Resource Strain: Low Risk  (01/04/2023)   Overall Financial Resource Strain (CARDIA)    Difficulty of Paying Living Expenses: Not very hard  Food Insecurity: No Food Insecurity (01/04/2023)   Hunger Vital Sign    Worried About Running Out of Food in the Last Year: Never true    Ran Out of Food in the Last Year: Never true  Transportation Needs: No Transportation Needs (01/04/2023)   PRAPARE - Administrator, Civil Service (Medical): No    Lack of Transportation (Non-Medical): No  Physical Activity: Insufficiently Active (01/04/2023)   Exercise Vital Sign    Days of Exercise  per Week: 1 day    Minutes of Exercise per Session: 30 min  Stress: Stress Concern Present (01/04/2023)   [employer] - Occupational Stress Questionnaire    Feeling of Stress : To some extent  Social Connections: Unknown (05/13/2023)   Received from [facility]   Social Network    Social Network: Not on file    Family History  Problem Relation Age of Onset   Hypertension Father    Psoriasis Father    Breast cancer Neg Hx     Health Maintenance  Topic Date Due   Hepatitis C Screening  Never done   Hepatitis B Vaccines (1 of 3 - 19+ 3-dose series) Never  done   Pneumococcal Vaccine: 50+ Years (1 of 1 - PCV) Never done   Zoster Vaccines- Shingrix (1 of 2) Never done   INFLUENZA VACCINE  04/07/2024   MAMMOGRAM  04/20/2024   DTaP/Tdap/Td (3 - Td or Tdap) 08/14/2026   Cervical Cancer Screening (HPV/Pap Cotest)  02/13/2029   Colonoscopy  04/12/2031   COVID-19 Vaccine  Completed   HIV Screening  Completed   HPV VACCINES  Aged Out   Meningococcal B Vaccine  Aged Out     ----------------------------------------------------------------------------------------------------------------------------------------------------------------------------------------------------------------- Physical Exam BP 118/80 (BP Location: Left Arm, Patient Position: Sitting, Cuff Size: Normal)   Pulse 74   Resp 20   Ht 5' 1 (1.549 m)   Wt 168 lb (76.2 kg)   LMP 10/09/2022 (Exact Date)   SpO2 99%   BMI 31.74 kg/m   Physical Exam Constitutional:      Appearance: Normal appearance.  HENT:     Head: Normocephalic.  Eyes:     General: No scleral icterus. Cardiovascular:     Rate and Rhythm: Normal rate and regular rhythm.  Pulmonary:     Effort: Pulmonary effort is normal.     Breath sounds: Normal breath sounds.  Neurological:     Mental Status: She is alert.  Psychiatric:        Mood and Affect: Mood normal.        Behavior: Behavior normal.     ------------------------------------------------------------------------------------------------------------------------------------------------------------------------------------------------------------------- Assessment and Plan  Obesity She is doing quite well with Wegovy  2.4mg  as far as appetite control.  Discussed incorporating some exercise/strength training to help with weight loss.    No orders of the defined types were placed in this encounter.   Return in about 3 months (around 07/20/2024) for weight management.

## 2024-04-24 ENCOUNTER — Encounter

## 2024-04-24 ENCOUNTER — Other Ambulatory Visit

## 2024-04-26 ENCOUNTER — Ambulatory Visit
Admission: RE | Admit: 2024-04-26 | Discharge: 2024-04-26 | Disposition: A | Discharge status: Home / Self Care | Source: Ambulatory Visit | Attending: Obstetrics & Gynecology | Admitting: Obstetrics & Gynecology

## 2024-04-26 ENCOUNTER — Ambulatory Visit

## 2024-04-26 DIAGNOSIS — R928 Other abnormal and inconclusive findings on diagnostic imaging of breast: Secondary | ICD-10-CM | POA: Diagnosis not present

## 2024-04-26 DIAGNOSIS — N644 Mastodynia: Secondary | ICD-10-CM

## 2024-05-03 ENCOUNTER — Encounter

## 2024-05-03 DIAGNOSIS — Z1231 Encounter for screening mammogram for malignant neoplasm of breast: Secondary | ICD-10-CM

## 2024-05-08 DIAGNOSIS — R7309 Other abnormal glucose: Secondary | ICD-10-CM | POA: Diagnosis not present

## 2024-05-09 ENCOUNTER — Encounter: Payer: Self-pay | Admitting: Sports Medicine

## 2024-05-28 DIAGNOSIS — M4317 Spondylolisthesis, lumbosacral region: Secondary | ICD-10-CM | POA: Diagnosis not present

## 2024-05-28 DIAGNOSIS — S1081XA Abrasion of other specified part of neck, initial encounter: Secondary | ICD-10-CM | POA: Diagnosis not present

## 2024-05-28 DIAGNOSIS — Z043 Encounter for examination and observation following other accident: Secondary | ICD-10-CM | POA: Diagnosis not present

## 2024-05-28 DIAGNOSIS — S8011XA Contusion of right lower leg, initial encounter: Secondary | ICD-10-CM | POA: Diagnosis not present

## 2024-05-28 DIAGNOSIS — S0083XA Contusion of other part of head, initial encounter: Secondary | ICD-10-CM | POA: Diagnosis not present

## 2024-05-28 DIAGNOSIS — R079 Chest pain, unspecified: Secondary | ICD-10-CM | POA: Diagnosis not present

## 2024-05-28 DIAGNOSIS — R0789 Other chest pain: Secondary | ICD-10-CM | POA: Diagnosis not present

## 2024-05-28 DIAGNOSIS — S20211A Contusion of right front wall of thorax, initial encounter: Secondary | ICD-10-CM | POA: Diagnosis not present

## 2024-05-28 DIAGNOSIS — M5137 Other intervertebral disc degeneration, lumbosacral region with discogenic back pain only: Secondary | ICD-10-CM | POA: Diagnosis not present

## 2024-05-28 DIAGNOSIS — Y92410 Unspecified street and highway as the place of occurrence of the external cause: Secondary | ICD-10-CM | POA: Diagnosis not present

## 2024-05-28 DIAGNOSIS — S20213A Contusion of bilateral front wall of thorax, initial encounter: Secondary | ICD-10-CM | POA: Diagnosis not present

## 2024-05-28 DIAGNOSIS — S0990XA Unspecified injury of head, initial encounter: Secondary | ICD-10-CM | POA: Diagnosis not present

## 2024-05-28 DIAGNOSIS — S8012XA Contusion of left lower leg, initial encounter: Secondary | ICD-10-CM | POA: Diagnosis not present

## 2024-06-07 DIAGNOSIS — R7309 Other abnormal glucose: Secondary | ICD-10-CM | POA: Diagnosis not present

## 2024-07-08 DIAGNOSIS — R7309 Other abnormal glucose: Secondary | ICD-10-CM | POA: Diagnosis not present

## 2024-07-20 ENCOUNTER — Ambulatory Visit: Admitting: Family Medicine

## 2024-08-17 ENCOUNTER — Telehealth: Payer: Self-pay

## 2024-08-17 ENCOUNTER — Other Ambulatory Visit (HOSPITAL_COMMUNITY): Payer: Self-pay

## 2024-08-17 NOTE — Telephone Encounter (Signed)
 Pharmacy Patient Advocate Encounter   Received notification from Onbase that prior authorization for Wegovy  2.4MG /0.75ML auto-injectors is required/requested.   Insurance verification completed.   The patient is insured through [facility].   Per test claim: PA required; PA submitted to above mentioned insurance via Latent Key/confirmation #/EOC [id number] Status is pending

## 2024-08-18 ENCOUNTER — Other Ambulatory Visit: Payer: Self-pay | Admitting: Family Medicine

## 2024-08-21 ENCOUNTER — Other Ambulatory Visit (HOSPITAL_COMMUNITY): Payer: Self-pay

## 2024-08-21 NOTE — Telephone Encounter (Signed)
 Pharmacy Patient Advocate Encounter  Received notification from [facility] that Prior Authorization for Wegovy  2.4MG /0.75ML auto-injectors has been APPROVED from 08/17/24 to 08/17/25. Unable to obtain price due to refill too soon rejection, last fill date 08/18/24 next available fill date2/18/26   PA #/Case ID/Reference #: [id number]
